# Patient Record
Sex: Male | Born: 1945 | Race: Black or African American | Hispanic: No | Marital: Single | Smoking: Former smoker
Health system: Southern US, Community
[De-identification: ages and names within clinical notes are randomized; demographics above are authoritative.]

## PROBLEM LIST (undated history)

## (undated) ENCOUNTER — Telehealth

## (undated) ENCOUNTER — Encounter

## (undated) ENCOUNTER — Ambulatory Visit: Payer: MEDICARE | Attending: Dermatology | Primary: Dermatology

## (undated) ENCOUNTER — Ambulatory Visit: Payer: MEDICARE

## (undated) ENCOUNTER — Ambulatory Visit

## (undated) ENCOUNTER — Ambulatory Visit: Attending: Internal Medicine | Primary: Internal Medicine

## (undated) ENCOUNTER — Encounter: Attending: Pharmacist | Primary: Pharmacist

## (undated) ENCOUNTER — Encounter: Attending: Dermatology | Primary: Dermatology

## (undated) DIAGNOSIS — C8400 Mycosis fungoides, unspecified site: Secondary | ICD-10-CM

## (undated) DIAGNOSIS — I10 Essential (primary) hypertension: Secondary | ICD-10-CM

## (undated) DIAGNOSIS — E7800 Pure hypercholesterolemia, unspecified: Secondary | ICD-10-CM

## (undated) DIAGNOSIS — E119 Type 2 diabetes mellitus without complications: Secondary | ICD-10-CM

## (undated) DIAGNOSIS — E039 Hypothyroidism, unspecified: Secondary | ICD-10-CM

## (undated) MED ORDER — GABAPENTIN 300 MG CAPSULE: Freq: Three times a day (TID) | ORAL | 0 days

---

## 2014-06-30 DEATH — deceased

## 2016-07-13 ENCOUNTER — Emergency Department (HOSPITAL_COMMUNITY): Payer: BC Managed Care – PPO

## 2016-07-13 ENCOUNTER — Encounter (HOSPITAL_COMMUNITY): Payer: Self-pay | Admitting: Emergency Medicine

## 2016-07-13 ENCOUNTER — Emergency Department (HOSPITAL_COMMUNITY)
Admission: EM | Admit: 2016-07-13 | Discharge: 2016-07-13 | Disposition: A | Discharge status: Home / Self Care | Payer: BC Managed Care – PPO | Attending: Emergency Medicine | Admitting: Emergency Medicine

## 2016-07-13 DIAGNOSIS — N210 Calculus in bladder: Secondary | ICD-10-CM | POA: Diagnosis not present

## 2016-07-13 DIAGNOSIS — R319 Hematuria, unspecified: Secondary | ICD-10-CM

## 2016-07-13 DIAGNOSIS — E119 Type 2 diabetes mellitus without complications: Secondary | ICD-10-CM | POA: Diagnosis not present

## 2016-07-13 DIAGNOSIS — Z79899 Other long term (current) drug therapy: Secondary | ICD-10-CM | POA: Insufficient documentation

## 2016-07-13 DIAGNOSIS — R310 Gross hematuria: Secondary | ICD-10-CM

## 2016-07-13 DIAGNOSIS — I10 Essential (primary) hypertension: Secondary | ICD-10-CM | POA: Diagnosis not present

## 2016-07-13 DIAGNOSIS — Z7982 Long term (current) use of aspirin: Secondary | ICD-10-CM | POA: Diagnosis not present

## 2016-07-13 DIAGNOSIS — F17200 Nicotine dependence, unspecified, uncomplicated: Secondary | ICD-10-CM | POA: Diagnosis not present

## 2016-07-13 HISTORY — DX: Essential (primary) hypertension: I10

## 2016-07-13 HISTORY — DX: Pure hypercholesterolemia, unspecified: E78.00

## 2016-07-13 HISTORY — DX: Type 2 diabetes mellitus without complications: E11.9

## 2016-07-13 LAB — URINALYSIS, ROUTINE W REFLEX MICROSCOPIC
BILIRUBIN URINE: NEGATIVE
GLUCOSE, UA: NEGATIVE mg/dL
Ketones, ur: NEGATIVE mg/dL
Nitrite: NEGATIVE
PH: 5 (ref 5.0–8.0)
Protein, ur: 100 mg/dL — AB
SPECIFIC GRAVITY, URINE: 1.018 (ref 1.005–1.030)
Squamous Epithelial / LPF: NONE SEEN

## 2016-07-13 LAB — COMPREHENSIVE METABOLIC PANEL
ALK PHOS: 48 U/L (ref 38–126)
ALT: 53 U/L (ref 17–63)
ANION GAP: 10 (ref 5–15)
AST: 34 U/L (ref 15–41)
Albumin: 4.2 g/dL (ref 3.5–5.0)
BILIRUBIN TOTAL: 0.5 mg/dL (ref 0.3–1.2)
BUN: 19 mg/dL (ref 6–20)
CALCIUM: 9.4 mg/dL (ref 8.9–10.3)
CO2: 23 mmol/L (ref 22–32)
Chloride: 104 mmol/L (ref 101–111)
Creatinine, Ser: 0.98 mg/dL (ref 0.61–1.24)
GLUCOSE: 121 mg/dL — AB (ref 65–99)
Potassium: 3.9 mmol/L (ref 3.5–5.1)
Sodium: 137 mmol/L (ref 135–145)
TOTAL PROTEIN: 7.7 g/dL (ref 6.5–8.1)

## 2016-07-13 LAB — CBC WITH DIFFERENTIAL/PLATELET
BASOS ABS: 0.1 10*3/uL (ref 0.0–0.1)
BASOS PCT: 1 %
Eosinophils Absolute: 1.3 10*3/uL — ABNORMAL HIGH (ref 0.0–0.7)
Eosinophils Relative: 15 %
HEMATOCRIT: 40.1 % (ref 39.0–52.0)
Hemoglobin: 13.2 g/dL (ref 13.0–17.0)
Lymphocytes Relative: 17 %
Lymphs Abs: 1.5 10*3/uL (ref 0.7–4.0)
MCH: 29.1 pg (ref 26.0–34.0)
MCHC: 32.9 g/dL (ref 30.0–36.0)
MCV: 88.3 fL (ref 78.0–100.0)
MONO ABS: 0.3 10*3/uL (ref 0.1–1.0)
Monocytes Relative: 3 %
NEUTROS ABS: 6 10*3/uL (ref 1.7–7.7)
Neutrophils Relative %: 64 %
PLATELETS: 201 10*3/uL (ref 150–400)
RBC: 4.54 MIL/uL (ref 4.22–5.81)
RDW: 14 % (ref 11.5–15.5)
WBC: 9.2 10*3/uL (ref 4.0–10.5)

## 2016-07-13 MED ORDER — IOPAMIDOL (ISOVUE-300) INJECTION 61%
100.0000 mL | Freq: Once | INTRAVENOUS | Status: AC | PRN
  Administered 2016-07-13: 100 mL via INTRAVENOUS

## 2016-07-13 NOTE — Discharge Instructions (Signed)
As discussed, it is important that you follow up with our urology physicians for continued management of your condition.

## 2016-07-13 NOTE — ED Provider Notes (Signed)
[facility] DEPT Provider Note   CSN: [id number] Arrival date & time: 07/13/16  1901     History   Chief Complaint Chief Complaint  Patient presents with  . Hematuria    HPI Cory Lynch is a 71 y.o. male.  HPI  Patient presents with concern of hematuria. Symptoms of been present for at least one week, but over the past day has the hematuria has been persistent patient now has some concern. No dysuria, no abdominal pain, no inability to urinate. He denies history of any bladder disease, prostate disease per He states that he is otherwise well, and was doing fine until this began. Since onset no clear alleviating or exacerbating factors.   Past Medical History:  Diagnosis Date  . Diabetes mellitus without complication ([place])   . Hypercholesteremia   . Hypertension     There are no active problems to display for this patient.   History reviewed. No pertinent surgical history.     Home Medications    Prior to Admission medications   Not on File    Family History No family history on file.  Social History Social History  Substance Use Topics  . Smoking status: Current Every Day Smoker  . Smokeless tobacco: Never Used  . Alcohol use No     Allergies   Patient has no allergy information on record.   Review of Systems Review of Systems  Constitutional:       Per HPI, otherwise negative  HENT:       Per HPI, otherwise negative  Respiratory:       Per HPI, otherwise negative  Cardiovascular:       Per HPI, otherwise negative  Gastrointestinal: Negative for vomiting.  Endocrine:       Negative aside from HPI  Genitourinary:       Neg aside from HPI   Musculoskeletal:       Per HPI, otherwise negative  Skin: Negative.   Neurological: Negative for syncope.     Physical Exam Updated Vital Signs BP 135/70   Pulse 93   Temp 98.1 F (36.7 C)   Resp 18   Ht 5\' 6"  (1.676 m)   Wt 90.7 kg (200 lb)   SpO2 99%   BMI 32.28 kg/m    Physical Exam  Constitutional: He is oriented to person, place, and time. He appears well-developed. No distress.  HENT:  Head: Normocephalic and atraumatic.  Eyes: Conjunctivae and EOM are normal.  Cardiovascular: Normal rate and regular rhythm.   Pulmonary/Chest: Effort normal. No stridor. No respiratory distress.  Abdominal: He exhibits no distension. There is no tenderness. There is no guarding.  Musculoskeletal: He exhibits no edema.  Neurological: He is alert and oriented to person, place, and time.  Skin: Skin is warm and dry.  Psychiatric: He has a normal mood and affect.  Nursing note and vitals reviewed.    ED Treatments / Results  Labs (all labs ordered are listed, but only abnormal results are displayed) Labs Reviewed  URINALYSIS, ROUTINE W REFLEX MICROSCOPIC - Abnormal; Notable for the following:       Result Value   APPearance CLOUDY (*)    Hgb urine dipstick LARGE (*)    Protein, ur 100 (*)    Leukocytes, UA TRACE (*)    Bacteria, UA RARE (*)    All other components within normal limits  COMPREHENSIVE METABOLIC PANEL - Abnormal; Notable for the following:    Glucose, Bld 121 (*)  All other components within normal limits  CBC WITH DIFFERENTIAL/PLATELET - Abnormal; Notable for the following:    Eosinophils Absolute 1.3 (*)    All other components within normal limits   Radiology study (CT) reviewed, and discussed with the patient.   Procedures Procedures (including critical care time)  Medications Ordered in ED Medications  iopamidol (ISOVUE-300) 61 % injection 100 mL (100 mLs Intravenous Contrast Given 07/13/16 2102)   Update: Patient accompanied by his sister. He is awake and alert, in no distress. He remains essentially symptomatic aside from that hematuria. We discussed the CT findings, concern for bladder stone, pelvic adenopathy, prostate enlargement, importance of following up with urology in the coming days. Patient remains hemodynamically  stable, awake, alert, in no distress, and  was discharged in stable condition with close outpatient follow-up.   Initial Impression / Assessment and Plan / ED Course  I have reviewed the triage vital signs and the nursing notes.  Pertinent labs & imaging results that were available during my care of the patient were reviewed by me and considered in my medical decision making (see chart for details).    Final Clinical Impressions(s) / ED Diagnoses   Final diagnoses:  Hematuria  Bladder stone Pelvic adenopathy   Carmin Muskrat, MD 07/16/16 [phone]

## 2016-07-13 NOTE — ED Triage Notes (Signed)
Pt c/o blood in urine for a while but denies any pain.

## 2016-07-27 ENCOUNTER — Ambulatory Visit (INDEPENDENT_AMBULATORY_CARE_PROVIDER_SITE_OTHER): Payer: BC Managed Care – PPO | Admitting: Urology

## 2016-07-27 ENCOUNTER — Other Ambulatory Visit: Payer: Self-pay | Admitting: Urology

## 2016-07-27 ENCOUNTER — Ambulatory Visit: Payer: BC Managed Care – PPO | Admitting: Urology

## 2016-07-27 DIAGNOSIS — R310 Gross hematuria: Secondary | ICD-10-CM

## 2016-07-27 DIAGNOSIS — R599 Enlarged lymph nodes, unspecified: Secondary | ICD-10-CM

## 2016-08-02 ENCOUNTER — Other Ambulatory Visit: Payer: Self-pay | Admitting: Student

## 2016-08-03 ENCOUNTER — Other Ambulatory Visit: Payer: Self-pay | Admitting: Student

## 2016-08-06 ENCOUNTER — Encounter (HOSPITAL_COMMUNITY): Payer: Self-pay

## 2016-08-06 ENCOUNTER — Ambulatory Visit (HOSPITAL_COMMUNITY): Payer: BC Managed Care – PPO

## 2016-08-06 ENCOUNTER — Ambulatory Visit (HOSPITAL_COMMUNITY): Admission: RE | Admit: 2016-08-06 | Payer: BC Managed Care – PPO | Source: Ambulatory Visit

## 2016-08-13 ENCOUNTER — Other Ambulatory Visit: Payer: Self-pay | Admitting: Radiology

## 2016-08-14 ENCOUNTER — Other Ambulatory Visit: Payer: Self-pay | Admitting: Student

## 2016-08-15 ENCOUNTER — Ambulatory Visit (HOSPITAL_COMMUNITY)
Admission: RE | Admit: 2016-08-15 | Discharge: 2016-08-15 | Disposition: A | Discharge status: Home / Self Care | Payer: BC Managed Care – PPO | Source: Ambulatory Visit | Attending: Urology | Admitting: Urology

## 2016-08-15 ENCOUNTER — Encounter (HOSPITAL_COMMUNITY): Payer: Self-pay

## 2016-08-15 DIAGNOSIS — E119 Type 2 diabetes mellitus without complications: Secondary | ICD-10-CM | POA: Insufficient documentation

## 2016-08-15 DIAGNOSIS — I10 Essential (primary) hypertension: Secondary | ICD-10-CM | POA: Insufficient documentation

## 2016-08-15 DIAGNOSIS — Z7982 Long term (current) use of aspirin: Secondary | ICD-10-CM | POA: Diagnosis not present

## 2016-08-15 DIAGNOSIS — E785 Hyperlipidemia, unspecified: Secondary | ICD-10-CM | POA: Insufficient documentation

## 2016-08-15 DIAGNOSIS — R319 Hematuria, unspecified: Secondary | ICD-10-CM | POA: Insufficient documentation

## 2016-08-15 DIAGNOSIS — F17200 Nicotine dependence, unspecified, uncomplicated: Secondary | ICD-10-CM | POA: Diagnosis not present

## 2016-08-15 DIAGNOSIS — C8595 Non-Hodgkin lymphoma, unspecified, lymph nodes of inguinal region and lower limb: Secondary | ICD-10-CM | POA: Diagnosis not present

## 2016-08-15 DIAGNOSIS — Z79899 Other long term (current) drug therapy: Secondary | ICD-10-CM | POA: Diagnosis not present

## 2016-08-15 DIAGNOSIS — R599 Enlarged lymph nodes, unspecified: Secondary | ICD-10-CM

## 2016-08-15 DIAGNOSIS — R590 Localized enlarged lymph nodes: Secondary | ICD-10-CM | POA: Diagnosis present

## 2016-08-15 LAB — CBC
HEMATOCRIT: 39.3 % (ref 39.0–52.0)
Hemoglobin: 13.3 g/dL (ref 13.0–17.0)
MCH: 29 pg (ref 26.0–34.0)
MCHC: 33.8 g/dL (ref 30.0–36.0)
MCV: 85.8 fL (ref 78.0–100.0)
Platelets: 226 10*3/uL (ref 150–400)
RBC: 4.58 MIL/uL (ref 4.22–5.81)
RDW: 13.8 % (ref 11.5–15.5)
WBC: 6.8 10*3/uL (ref 4.0–10.5)

## 2016-08-15 LAB — PROTIME-INR
INR: 1.01
Prothrombin Time: 13.3 seconds (ref 11.4–15.2)

## 2016-08-15 LAB — APTT: aPTT: 30 seconds (ref 24–36)

## 2016-08-15 LAB — GLUCOSE, CAPILLARY: Glucose-Capillary: 121 mg/dL — ABNORMAL HIGH (ref 65–99)

## 2016-08-15 MED ORDER — MIDAZOLAM HCL 2 MG/2ML IJ SOLN
INTRAMUSCULAR | Status: AC | PRN
  Administered 2016-08-15 (×2): 1 mg via INTRAVENOUS

## 2016-08-15 MED ORDER — FENTANYL CITRATE (PF) 100 MCG/2ML IJ SOLN
INTRAMUSCULAR | Status: AC
  Filled 2016-08-15: qty 4

## 2016-08-15 MED ORDER — SODIUM CHLORIDE 0.9 % IV SOLN
INTRAVENOUS | Status: DC
  Administered 2016-08-15: 08:00:00 via INTRAVENOUS

## 2016-08-15 MED ORDER — MIDAZOLAM HCL 2 MG/2ML IJ SOLN
INTRAMUSCULAR | Status: AC
  Filled 2016-08-15: qty 4

## 2016-08-15 MED ORDER — LIDOCAINE HCL 1 % IJ SOLN
INTRAMUSCULAR | Status: DC | PRN
  Administered 2016-08-15: 10 mL

## 2016-08-15 MED ORDER — FENTANYL CITRATE (PF) 100 MCG/2ML IJ SOLN
INTRAMUSCULAR | Status: AC | PRN
  Administered 2016-08-15 (×2): 50 ug via INTRAVENOUS

## 2016-08-15 NOTE — Procedures (Signed)
Interventional Radiology Procedure Note  Procedure: CT guided biopsy of deep right inguinal LN.   Complications: None  Estimated Blood Loss:  <25 mL  Recommendations: - Path pending - Bedrest x 1 hr - DC home  Signed,  Criselda Peaches, MD

## 2016-08-15 NOTE — Discharge Instructions (Signed)
Moderate Conscious Sedation, Adult, Care After  These instructions provide you with information about caring for yourself after your procedure. Your health care provider may also give you more specific instructions. Your treatment has been planned according to current medical practices, but problems sometimes occur. Call your health care provider if you have any problems or questions after your procedure.  What can I expect after the procedure?  After your procedure, it is common:   To feel sleepy for several hours.   To feel clumsy and have poor balance for several hours.   To have poor judgment for several hours.   To vomit if you eat too soon.    Follow these instructions at home:  For at least 24 hours after the procedure:     Do not:  ? Participate in activities where you could fall or become injured.  ? Drive.  ? Use heavy machinery.  ? Drink alcohol.  ? Take sleeping pills or medicines that cause drowsiness.  ? Make important decisions or sign legal documents.  ? Take care of children on your own.   Rest.  Eating and drinking   Follow the diet recommended by your health care provider.   If you vomit:  ? Drink water, juice, or soup when you can drink without vomiting.  ? Make sure you have little or no nausea before eating solid foods.  General instructions   Have a responsible adult stay with you until you are awake and alert.   Take over-the-counter and prescription medicines only as told by your health care provider.   If you smoke, do not smoke without supervision.   Keep all follow-up visits as told by your health care provider. This is important.  Contact a health care provider if:   You keep feeling nauseous or you keep vomiting.   You feel light-headed.   You develop a rash.   You have a fever.  Get help right away if:   You have trouble breathing.  This information is not intended to replace advice given to you by your health care provider. Make sure you discuss any questions you have  with your health care provider.  Document Released: 11/05/2012 Document Revised: 06/20/2015 Document Reviewed: 05/07/2015  Elsevier Interactive Patient Education  2018 [employer]

## 2016-08-15 NOTE — H&P (Signed)
Chief Complaint: Patient was seen in consultation today for lymphadenopathy  Referring Physician(s): Wrenn,John  Supervising Physician: Jacqulynn Cadet  Patient Status: [facility] - Out-pt  History of Present Illness: Cory Lynch is a 71 y.o. male with past medical hsitory of DM, HLD, and HTN presents with recent complaint of hematuria for which he has been evaluated by Urology.   CT Abd/Pelvis 07/13/16: 1. 2.3 cm loose urinary bladder stone. 2. Pathologically enlarged pelvic adenopathy suspicious for malignancy. Possibilities might include lymphoma or prostate cancer. Tissue diagnosis likely warranted. 3. Considerable enlargement of the prostate gland, volume estimated at 100 cc, with a focally prominent median lobe indenting the bladder base, and with some associated fullness of the seminal vesicles. In light of the pelvic adenopathy, workup by PSA level and assessment the prostate is suggested. 4. 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  IR consulted for lymph node biopsy at the request of Dr. Jeffie Pollock. Case has been reviewed by Dr. Anselm Pancoast who approves patient for procedure.   Patient presents today in his usual state of health.  He has been NPO.  He does not take blood thinners.    Past Medical History:  Diagnosis Date  . Diabetes mellitus without complication ([place])   . Hypercholesteremia   . Hypertension     History reviewed. No pertinent surgical history.  Allergies: Patient has no known allergies.  Medications: Prior to Admission medications   Medication Sig Start Date End Date Taking? Authorizing Provider  aspirin EC 81 MG tablet Take 81 mg by mouth daily.   Yes [provider]  atorvastatin (LIPITOR) 40 MG tablet Take 40 mg by mouth at bedtime. 04/12/16  Yes [provider]  hydrocortisone 2.5 % ointment Apply 1 application topically  daily as needed (for itching/irritation).  06/22/16  Yes [provider]  lisinopril (PRINIVIL,ZESTRIL) 5 MG tablet Take 5 mg by mouth daily. 04/12/16  Yes [provider]  hydrOXYzine (ATARAX/VISTARIL) 25 MG tablet Take 25 mg by mouth daily as needed for anxiety or itching.  05/24/16   [provider]  ibuprofen (ADVIL,MOTRIN) 800 MG tablet Take 800 mg by mouth 3 (three) times daily as needed for mild pain or moderate pain.  04/16/16   [provider]     History reviewed. No pertinent family history.  Social History   Social History  . Marital status: Single    Spouse name: N/A  . Number of children: N/A  . Years of education: N/A   Social History Main Topics  . Smoking status: Current Every Day Smoker  . Smokeless tobacco: Never Used  . Alcohol use No  . Drug use: No  . Sexual activity: Not Asked   Other Topics Concern  . None   Social History Narrative  . None    Review of Systems  Constitutional: Negative for fatigue and fever.  Respiratory: Negative for cough and shortness of breath.   Cardiovascular: Negative for chest pain.  Gastrointestinal: Negative for abdominal pain.  Psychiatric/Behavioral: Negative for behavioral problems and confusion.    Vital Signs: BP 131/78   Pulse 77   Temp (!) 97.2 F (36.2 C) (Oral)   Resp 18   Ht 5' 7.5" (1.715 m)   Wt 203 lb 8 oz (92.3 kg)   SpO2 98%   BMI 31.40 kg/m   Physical Exam  Constitutional: He is oriented to person, place, and time. He  appears well-developed.  Cardiovascular: Normal rate, regular rhythm and normal heart sounds.   Pulmonary/Chest: Effort normal and breath sounds normal. No respiratory distress.  Neurological: He is alert and oriented to person, place, and time.  Skin: Skin is warm and dry.  Psychiatric: He has a normal mood and affect. His behavior is normal. Judgment and thought content normal.  Nursing note and vitals reviewed.   Mallampati Score:  MD  Evaluation Airway: WNL Heart: WNL Abdomen: WNL Chest/ Lungs: WNL ASA  Classification: 3 Mallampati/Airway Score: Two  Imaging: No results found.  Labs:  CBC:  Recent Labs  07/13/16 1949 08/15/16 0724  WBC 9.2 6.8  HGB 13.2 13.3  HCT 40.1 39.3  PLT 201 226    COAGS:  Recent Labs  08/15/16 0724  INR 1.01  APTT 30    BMP:  Recent Labs  07/13/16 1949  NA 137  K 3.9  CL 104  CO2 23  GLUCOSE 121*  BUN 19  CALCIUM 9.4  CREATININE 0.98  GFRNONAA >60  GFRAA >60    LIVER FUNCTION TESTS:  Recent Labs  07/13/16 1949  BILITOT 0.5  AST 34  ALT 53  ALKPHOS 48  PROT 7.7  ALBUMIN 4.2    TUMOR MARKERS: No results for input(s): AFPTM, CEA, CA199, CHROMGRNA in the last 8760 hours.  Assessment and Plan: Patient with recent episodes of hematuria is found to have pelvic lymphadenopathy.  IR consulted for lymph node biopsy at the request of Dr. Jeffie Pollock.  Patient presents for procedure today.  He denies new complaints.  He has been NPO.  He does not take blood thinners. Risks and Benefits discussed with the patient including, but not limited to bleeding, infection, damage to adjacent structures or low yield requiring additional tests. All of the patient's questions were answered, patient is agreeable to proceed. Consent signed and in chart.  Thank you for this interesting consult.  I greatly enjoyed meeting Cory Lynch and look forward to participating in their care.  A copy of this report was sent to the requesting provider on this date.  Electronically Signed: Docia Barrier, PA 08/15/2016, 8:48 AM   I spent a total of  30 Minutes   in face to face in clinical consultation, greater than 50% of which was counseling/coordinating care for lymphadenopathy.

## 2016-08-29 ENCOUNTER — Ambulatory Visit (INDEPENDENT_AMBULATORY_CARE_PROVIDER_SITE_OTHER): Payer: BC Managed Care – PPO | Admitting: Urology

## 2016-08-29 DIAGNOSIS — R310 Gross hematuria: Secondary | ICD-10-CM

## 2016-08-30 ENCOUNTER — Ambulatory Visit (HOSPITAL_COMMUNITY): Payer: Medicare Other | Admitting: Oncology

## 2016-09-04 ENCOUNTER — Other Ambulatory Visit: Payer: Self-pay | Admitting: Urology

## 2016-09-10 NOTE — Patient Instructions (Signed)
Cory Lynch  09/10/2016     @PREFPERIOPPHARMACY @   Your procedure is scheduled on  09/17/2016 .  Report to [facility] at  [email or web address].  Call this number if you have problems the morning of surgery:  [phone]   Remember:  Do not eat food or drink liquids after midnight.  Take these medicines the morning of surgery with A SIP OF WATER  Lisinopril.   Do not wear jewelry, make-up or nail polish.  Do not wear lotions, powders, or perfumes, or deoderant.  Do not shave 48 hours prior to surgery.  Men may shave face and neck.  Do not bring valuables to the hospital.  [facility] is not responsible for any belongings or valuables.  Contacts, dentures or bridgework may not be worn into surgery.  Leave your suitcase in the car.  After surgery it may be brought to your room.  For patients admitted to the hospital, discharge time will be determined by your treatment team.  Patients discharged the day of surgery will not be allowed to drive home.   Name and phone number of your driver:   family Special instructions:  None  Please read over the following fact sheets that you were given. Anesthesia Post-op Instructions and Care and Recovery After Surgery       Cystoscopy Cystoscopy is a procedure that is used to help diagnose and sometimes treat conditions that affect that lower urinary tract. The lower urinary tract includes the bladder and the tube that drains urine from the bladder out of the body (urethra). Cystoscopy is performed with a thin, tube-shaped instrument with a light and camera at the end (cystoscope). The cystoscope may be hard (rigid) or flexible, depending on the goal of the procedure.The cystoscope is inserted through the urethra, into the bladder. Cystoscopy may be recommended if you have:  Urinary tractinfections that keep coming back (recurring).  Blood in the urine (hematuria).  Loss of bladder control (urinary incontinence) or an overactive  bladder.  Unusual cells found in a urine sample.  A blockage in the urethra.  Painful urination.  An abnormality in the bladder found during an intravenous pyelogram (IVP) or CT scan.  Cystoscopy may also be done to remove a sample of tissue to be examined under a microscope (biopsy). Tell a health care provider about:  Any allergies you have.  All medicines you are taking, including vitamins, herbs, eye drops, creams, and over-the-counter medicines.  Any problems you or family members have had with anesthetic medicines.  Any blood disorders you have.  Any surgeries you have had.  Any medical conditions you have.  Whether you are pregnant or may be pregnant. What are the risks? Generally, this is a safe procedure. However, problems may occur, including:  Infection.  Bleeding.  Allergic reactions to medicines.  Damage to other structures or organs.  What happens before the procedure?  Ask your health care provider about: ? Changing or stopping your regular medicines. This is especially important if you are taking diabetes medicines or blood thinners. ? Taking medicines such as aspirin and ibuprofen. These medicines can thin your blood. Do not take these medicines before your procedure if your health care provider instructs you not to.  Follow instructions from your health care provider about eating or drinking restrictions.  You may be given antibiotic medicine to help prevent infection.  You may have an exam or testing, such as X-rays  of the bladder, urethra, or kidneys.  You may have urine tests to check for signs of infection.  Plan to have someone take you home after the procedure. What happens during the procedure?  To reduce your risk of infection,your health care team will wash or sanitize their hands.  You will be given one or more of the following: ? A medicine to help you relax (sedative). ? A medicine to numb the area (local anesthetic).  The  area around the opening of your urethra will be cleaned.  The cystoscope will be passed through your urethra into your bladder.  Germ-free (sterile)fluid will flow through the cystoscope to fill your bladder. The fluid will stretch your bladder so that your surgeon can clearly examine your bladder walls.  The cystoscope will be removed and your bladder will be emptied. The procedure may vary among health care providers and hospitals. What happens after the procedure?  You may have some soreness or pain in your abdomen and urethra. Medicines will be available to help you.  You may have some blood in your urine.  Do not drive for 24 hours if you received a sedative. This information is not intended to replace advice given to you by your health care provider. Make sure you discuss any questions you have with your health care provider. Document Released: 01/13/2000 Document Revised: 05/26/2015 Document Reviewed: 12/02/2014 Elsevier Interactive Patient Education  2017 [place].  Cystoscopy, Care After Refer to this sheet in the next few weeks. These instructions provide you with information about caring for yourself after your procedure. Your health care provider may also give you more specific instructions. Your treatment has been planned according to current medical practices, but problems sometimes occur. Call your health care provider if you have any problems or questions after your procedure. What can I expect after the procedure? After the procedure, it is common to have:  Mild pain when you urinate. Pain should stop within a few minutes after you urinate. This may last for up to 1 week.  A small amount of blood in your urine for several days.  Feeling like you need to urinate but producing only a small amount of urine.  Follow these instructions at home:  Medicines  Take over-the-counter and prescription medicines only as told by your health care provider.  If you were  prescribed an antibiotic medicine, take it as told by your health care provider. Do not stop taking the antibiotic even if you start to feel better. General instructions   Return to your normal activities as told by your health care provider. Ask your health care provider what activities are safe for you.  Do not drive for 24 hours if you received a sedative.  Watch for any blood in your urine. If the amount of blood in your urine increases, call your health care provider.  Follow instructions from your health care provider about eating or drinking restrictions.  If a tissue sample was removed for testing (biopsy) during your procedure, it is your responsibility to get your test results. Ask your health care provider or the department performing the test when your results will be ready.  Drink enough fluid to keep your urine clear or pale yellow.  Keep all follow-up visits as told by your health care provider. This is important. Contact a health care provider if:  You have pain that gets worse or does not get better with medicine, especially pain when you urinate.  You have difficulty urinating.  Get help right away if:  You have more blood in your urine.  You have blood clots in your urine.  You have abdominal pain.  You have a fever or chills.  You are unable to urinate. This information is not intended to replace advice given to you by your health care provider. Make sure you discuss any questions you have with your health care provider. Document Released: 08/04/2004 Document Revised: 06/23/2015 Document Reviewed: 12/02/2014 Elsevier Interactive Patient Education  2017 [place] Anesthesia, Adult General anesthesia is the use of medicines to make a person "go to sleep" (be unconscious) for a medical procedure. General anesthesia is often recommended when a procedure:  Is long.  Requires you to be still or in an unusual position.  Is major and can cause you  to lose blood.  Is impossible to do without general anesthesia.  The medicines used for general anesthesia are called general anesthetics. In addition to making you sleep, the medicines:  Prevent pain.  Control your blood pressure.  Relax your muscles.  Tell a health care provider about:  Any allergies you have.  All medicines you are taking, including vitamins, herbs, eye drops, creams, and over-the-counter medicines.  Any problems you or family members have had with anesthetic medicines.  Types of anesthetics you have had in the past.  Any bleeding disorders you have.  Any surgeries you have had.  Any medical conditions you have.  Any history of heart or lung conditions, such as heart failure, sleep apnea, or chronic obstructive pulmonary disease (COPD).  Whether you are pregnant or may be pregnant.  Whether you use tobacco, alcohol, marijuana, or street drugs.  Any history of Armed forces logistics/support/administrative officer.  Any history of depression or anxiety. What are the risks? Generally, this is a safe procedure. However, problems may occur, including:  Allergic reaction to anesthetics.  Lung and heart problems.  Inhaling food or liquids from your stomach into your lungs (aspiration).  Injury to nerves.  Waking up during your procedure and being unable to move (rare).  Extreme agitation or a state of mental confusion (delirium) when you wake up from the anesthetic.  Air in the bloodstream, which can lead to stroke.  These problems are more likely to develop if you are having a major surgery or if you have an advanced medical condition. You can prevent some of these complications by answering all of your health care provider's questions thoroughly and by following all pre-procedure instructions. General anesthesia can cause side effects, including:  Nausea or vomiting  A sore throat from the breathing tube.  Feeling cold or shivery.  Feeling tired, washed out, or  achy.  Sleepiness or drowsiness.  Confusion or agitation.  What happens before the procedure? Staying hydrated Follow instructions from your health care provider about hydration, which may include:  Up to 2 hours before the procedure - you may continue to drink clear liquids, such as water, clear fruit juice, black coffee, and plain tea.  Eating and drinking restrictions Follow instructions from your health care provider about eating and drinking, which may include:  8 hours before the procedure - stop eating heavy meals or foods such as meat, fried foods, or fatty foods.  6 hours before the procedure - stop eating light meals or foods, such as toast or cereal.  6 hours before the procedure - stop drinking milk or drinks that contain milk.  2 hours before the procedure - stop drinking clear liquids.  Medicines  Ask your health care provider about: ? Changing or stopping your regular medicines. This is especially important if you are taking diabetes medicines or blood thinners. ? Taking medicines such as aspirin and ibuprofen. These medicines can thin your blood. Do not take these medicines before your procedure if your health care provider instructs you not to. ? Taking new dietary supplements or medicines. Do not take these during the week before your procedure unless your health care provider approves them.  If you are told to take a medicine or to continue taking a medicine on the day of the procedure, take the medicine with sips of water. General instructions   Ask if you will be going home the same day, the following day, or after a longer hospital stay. ? Plan to have someone take you home. ? Plan to have someone stay with you for the first 24 hours after you leave the hospital or clinic.  For 3-6 weeks before the procedure, try not to use any tobacco products, such as cigarettes, chewing tobacco, and e-cigarettes.  You may brush your teeth on the morning of the  procedure, but make sure to spit out the toothpaste. What happens during the procedure?  You will be given anesthetics through a mask and through an IV tube in one of your veins.  You may receive medicine to help you relax (sedative).  As soon as you are asleep, a breathing tube may be used to help you breathe.  An anesthesia specialist will stay with you throughout the procedure. He or she will help keep you comfortable and safe by continuing to give you medicines and adjusting the amount of medicine that you get. He or she will also watch your blood pressure, pulse, and oxygen levels to make sure that the anesthetics do not cause any problems.  If a breathing tube was used to help you breathe, it will be removed before you wake up. The procedure may vary among health care providers and hospitals. What happens after the procedure?  You will wake up, often slowly, after the procedure is complete, usually in a recovery area.  Your blood pressure, heart rate, breathing rate, and blood oxygen level will be monitored until the medicines you were given have worn off.  You may be given medicine to help you calm down if you feel anxious or agitated.  If you will be going home the same day, your health care provider may check to make sure you can stand, drink, and urinate.  Your health care providers will treat your pain and side effects before you go home.  Do not drive for 24 hours if you received a sedative.  You may: ? Feel nauseous and vomit. ? Have a sore throat. ? Have mental slowness. ? Feel cold or shivery. ? Feel sleepy. ? Feel tired. ? Feel sore or achy, even in parts of your body where you did not have surgery. This information is not intended to replace advice given to you by your health care provider. Make sure you discuss any questions you have with your health care provider. Document Released: 04/24/2007 Document Revised: 06/28/2015 Document Reviewed: 12/30/2014 Elsevier  Interactive Patient Education  2018 [place] Anesthesia, Adult, Care After These instructions provide you with information about caring for yourself after your procedure. Your health care provider may also give you more specific instructions. Your treatment has been planned according to current medical practices, but problems sometimes occur. Call your health care provider if you  have any problems or questions after your procedure. What can I expect after the procedure? After the procedure, it is common to have:  Vomiting.  A sore throat.  Mental slowness.  It is common to feel:  Nauseous.  Cold or shivery.  Sleepy.  Tired.  Sore or achy, even in parts of your body where you did not have surgery.  Follow these instructions at home: For at least 24 hours after the procedure:  Do not: ? Participate in activities where you could fall or become injured. ? Drive. ? Use heavy machinery. ? Drink alcohol. ? Take sleeping pills or medicines that cause drowsiness. ? Make important decisions or sign legal documents. ? Take care of children on your own.  Rest. Eating and drinking  If you vomit, drink water, juice, or soup when you can drink without vomiting.  Drink enough fluid to keep your urine clear or pale yellow.  Make sure you have little or no nausea before eating solid foods.  Follow the diet recommended by your health care provider. General instructions  Have a responsible adult stay with you until you are awake and alert.  Return to your normal activities as told by your health care provider. Ask your health care provider what activities are safe for you.  Take over-the-counter and prescription medicines only as told by your health care provider.  If you smoke, do not smoke without supervision.  Keep all follow-up visits as told by your health care provider. This is important. Contact a health care provider if:  You continue to have nausea or  vomiting at home, and medicines are not helpful.  You cannot drink fluids or start eating again.  You cannot urinate after 8-12 hours.  You develop a skin rash.  You have fever.  You have increasing redness at the site of your procedure. Get help right away if:  You have difficulty breathing.  You have chest pain.  You have unexpected bleeding.  You feel that you are having a life-threatening or urgent problem. This information is not intended to replace advice given to you by your health care provider. Make sure you discuss any questions you have with your health care provider. Document Released: 04/23/2000 Document Revised: 06/20/2015 Document Reviewed: 12/30/2014 Elsevier Interactive Patient Education  [employer].

## 2016-09-12 ENCOUNTER — Other Ambulatory Visit (HOSPITAL_COMMUNITY): Payer: Medicare Other

## 2016-09-14 ENCOUNTER — Encounter (HOSPITAL_COMMUNITY)
Admission: RE | Admit: 2016-09-14 | Discharge: 2016-09-14 | Disposition: A | Discharge status: Home / Self Care | Payer: BC Managed Care – PPO | Source: Ambulatory Visit | Attending: Urology | Admitting: Urology

## 2016-09-14 ENCOUNTER — Encounter (HOSPITAL_COMMUNITY): Payer: Self-pay

## 2016-09-14 DIAGNOSIS — Z87891 Personal history of nicotine dependence: Secondary | ICD-10-CM | POA: Diagnosis not present

## 2016-09-14 DIAGNOSIS — Z79899 Other long term (current) drug therapy: Secondary | ICD-10-CM | POA: Diagnosis not present

## 2016-09-14 DIAGNOSIS — R339 Retention of urine, unspecified: Secondary | ICD-10-CM | POA: Diagnosis not present

## 2016-09-14 DIAGNOSIS — I10 Essential (primary) hypertension: Secondary | ICD-10-CM | POA: Diagnosis not present

## 2016-09-14 DIAGNOSIS — Z7982 Long term (current) use of aspirin: Secondary | ICD-10-CM | POA: Diagnosis not present

## 2016-09-14 DIAGNOSIS — E119 Type 2 diabetes mellitus without complications: Secondary | ICD-10-CM | POA: Diagnosis not present

## 2016-09-14 DIAGNOSIS — N401 Enlarged prostate with lower urinary tract symptoms: Secondary | ICD-10-CM | POA: Diagnosis present

## 2016-09-14 DIAGNOSIS — N210 Calculus in bladder: Secondary | ICD-10-CM | POA: Diagnosis present

## 2016-09-14 DIAGNOSIS — Z87442 Personal history of urinary calculi: Secondary | ICD-10-CM | POA: Diagnosis not present

## 2016-09-14 DIAGNOSIS — R310 Gross hematuria: Secondary | ICD-10-CM | POA: Diagnosis not present

## 2016-09-14 DIAGNOSIS — E7800 Pure hypercholesterolemia, unspecified: Secondary | ICD-10-CM | POA: Diagnosis not present

## 2016-09-17 ENCOUNTER — Ambulatory Visit (HOSPITAL_COMMUNITY)
Admission: RE | Admit: 2016-09-17 | Discharge: 2016-09-17 | Disposition: A | Discharge status: Home / Self Care | Payer: BC Managed Care – PPO | Source: Ambulatory Visit | Attending: Urology | Admitting: Urology

## 2016-09-17 ENCOUNTER — Encounter (HOSPITAL_COMMUNITY)
Admission: RE | Disposition: A | Discharge status: Home / Self Care | Payer: Self-pay | Source: Ambulatory Visit | Attending: Urology

## 2016-09-17 ENCOUNTER — Ambulatory Visit (HOSPITAL_COMMUNITY): Payer: BC Managed Care – PPO | Admitting: Anesthesiology

## 2016-09-17 ENCOUNTER — Encounter (HOSPITAL_COMMUNITY): Payer: Self-pay | Admitting: *Deleted

## 2016-09-17 DIAGNOSIS — N210 Calculus in bladder: Secondary | ICD-10-CM

## 2016-09-17 DIAGNOSIS — Z87442 Personal history of urinary calculi: Secondary | ICD-10-CM | POA: Insufficient documentation

## 2016-09-17 DIAGNOSIS — R310 Gross hematuria: Secondary | ICD-10-CM | POA: Insufficient documentation

## 2016-09-17 DIAGNOSIS — E7800 Pure hypercholesterolemia, unspecified: Secondary | ICD-10-CM | POA: Insufficient documentation

## 2016-09-17 DIAGNOSIS — Z87891 Personal history of nicotine dependence: Secondary | ICD-10-CM | POA: Insufficient documentation

## 2016-09-17 DIAGNOSIS — E119 Type 2 diabetes mellitus without complications: Secondary | ICD-10-CM | POA: Insufficient documentation

## 2016-09-17 DIAGNOSIS — Z7982 Long term (current) use of aspirin: Secondary | ICD-10-CM | POA: Insufficient documentation

## 2016-09-17 DIAGNOSIS — I10 Essential (primary) hypertension: Secondary | ICD-10-CM | POA: Insufficient documentation

## 2016-09-17 DIAGNOSIS — R339 Retention of urine, unspecified: Secondary | ICD-10-CM | POA: Insufficient documentation

## 2016-09-17 DIAGNOSIS — Z79899 Other long term (current) drug therapy: Secondary | ICD-10-CM | POA: Insufficient documentation

## 2016-09-17 DIAGNOSIS — N401 Enlarged prostate with lower urinary tract symptoms: Secondary | ICD-10-CM | POA: Insufficient documentation

## 2016-09-17 HISTORY — PX: HOLMIUM LASER APPLICATION: SHX5852

## 2016-09-17 HISTORY — PX: CYSTOSCOPY WITH LITHOLAPAXY: SHX1425

## 2016-09-17 LAB — GLUCOSE, CAPILLARY
Glucose-Capillary: 129 mg/dL — ABNORMAL HIGH (ref 65–99)
Glucose-Capillary: 120 mg/dL — ABNORMAL HIGH (ref 65–99)

## 2016-09-17 SURGERY — CYSTOSCOPY, WITH BLADDER CALCULUS LITHOLAPAXY
Anesthesia: General

## 2016-09-17 MED ORDER — LIDOCAINE HCL (CARDIAC) 10 MG/ML IV SOLN
INTRAVENOUS | Status: DC | PRN
  Administered 2016-09-17: 40 mg via INTRAVENOUS

## 2016-09-17 MED ORDER — PROPOFOL 10 MG/ML IV BOLUS
INTRAVENOUS | Status: AC
  Filled 2016-09-17: qty 20

## 2016-09-17 MED ORDER — FENTANYL CITRATE (PF) 100 MCG/2ML IJ SOLN
INTRAMUSCULAR | Status: DC | PRN
  Administered 2016-09-17: 50 ug via INTRAVENOUS

## 2016-09-17 MED ORDER — LIDOCAINE HCL (PF) 1 % IJ SOLN
INTRAMUSCULAR | Status: AC
  Filled 2016-09-17: qty 5

## 2016-09-17 MED ORDER — ONDANSETRON HCL 4 MG/2ML IJ SOLN
INTRAMUSCULAR | Status: AC
  Filled 2016-09-17: qty 2

## 2016-09-17 MED ORDER — PHENYLEPHRINE 40 MCG/ML (10ML) SYRINGE FOR IV PUSH (FOR BLOOD PRESSURE SUPPORT)
PREFILLED_SYRINGE | INTRAVENOUS | Status: AC
  Filled 2016-09-17: qty 10

## 2016-09-17 MED ORDER — SODIUM CHLORIDE 0.9 % IR SOLN
Status: DC | PRN
  Administered 2016-09-17: 6000 mL via INTRAVESICAL

## 2016-09-17 MED ORDER — FENTANYL CITRATE (PF) 250 MCG/5ML IJ SOLN
INTRAMUSCULAR | Status: AC
  Filled 2016-09-17: qty 5

## 2016-09-17 MED ORDER — PHENYLEPHRINE HCL 10 MG/ML IJ SOLN
INTRAMUSCULAR | Status: DC | PRN
  Administered 2016-09-17 (×2): 40 ug via INTRAVENOUS

## 2016-09-17 MED ORDER — SODIUM CHLORIDE 0.9 % IJ SOLN
INTRAMUSCULAR | Status: AC
  Filled 2016-09-17: qty 10

## 2016-09-17 MED ORDER — CEFAZOLIN SODIUM-DEXTROSE 2-4 GM/100ML-% IV SOLN
2.0000 g | INTRAVENOUS | Status: AC
  Administered 2016-09-17: 2 g via INTRAVENOUS
  Filled 2016-09-17: qty 100

## 2016-09-17 MED ORDER — OXYCODONE-ACETAMINOPHEN 5-325 MG PO TABS: 1.0000 | ORAL_TABLET | Freq: Four times a day (QID) | ORAL | 0 refills | Status: DC | PRN

## 2016-09-17 MED ORDER — EPHEDRINE SULFATE 50 MG/ML IJ SOLN
INTRAMUSCULAR | Status: DC | PRN
  Administered 2016-09-17 (×3): 10 mg via INTRAVENOUS

## 2016-09-17 MED ORDER — MIDAZOLAM HCL 2 MG/2ML IJ SOLN
INTRAMUSCULAR | Status: AC
  Filled 2016-09-17: qty 2

## 2016-09-17 MED ORDER — TAMSULOSIN HCL 0.4 MG PO CAPS: 0.4000 mg | ORAL_CAPSULE | Freq: Every day | ORAL | 11 refills | Status: DC

## 2016-09-17 MED ORDER — EPHEDRINE SULFATE 50 MG/ML IJ SOLN
INTRAMUSCULAR | Status: AC
  Filled 2016-09-17: qty 1

## 2016-09-17 MED ORDER — LACTATED RINGERS IV SOLN
INTRAVENOUS | Status: DC
  Administered 2016-09-17: 08:00:00 via INTRAVENOUS

## 2016-09-17 MED ORDER — ONDANSETRON HCL 4 MG/2ML IJ SOLN
4.0000 mg | Freq: Once | INTRAMUSCULAR | Status: AC
  Administered 2016-09-17: 4 mg via INTRAVENOUS

## 2016-09-17 MED ORDER — MIDAZOLAM HCL 2 MG/2ML IJ SOLN
1.0000 mg | Freq: Once | INTRAMUSCULAR | Status: AC | PRN
  Administered 2016-09-17: 2 mg via INTRAVENOUS

## 2016-09-17 MED ORDER — PROPOFOL 10 MG/ML IV BOLUS
INTRAVENOUS | Status: DC | PRN
  Administered 2016-09-17: 160 mg via INTRAVENOUS
  Administered 2016-09-17 (×2): 50 mg via INTRAVENOUS
  Administered 2016-09-17: 40 mg via INTRAVENOUS

## 2016-09-17 MED ORDER — STERILE WATER FOR IRRIGATION IR SOLN
Status: DC | PRN
  Administered 2016-09-17: 1000 mL

## 2016-09-17 MED ORDER — GLYCOPYRROLATE 0.2 MG/ML IJ SOLN
INTRAMUSCULAR | Status: AC
  Filled 2016-09-17: qty 1

## 2016-09-17 MED ORDER — GLYCOPYRROLATE 0.2 MG/ML IJ SOLN
0.1000 mg | Freq: Once | INTRAMUSCULAR | Status: AC
  Administered 2016-09-17: 0.1 mg via INTRAVENOUS

## 2016-09-17 SURGICAL SUPPLY — 18 items
BAG HAMPER (MISCELLANEOUS) ×3 IMPLANT
BAG URINE LEG 500ML (DRAIN) ×3 IMPLANT
CATH FOLEY 3WAY 30CC 22F (CATHETERS) ×3 IMPLANT
CLOTH BEACON ORANGE TIMEOUT ST (SAFETY) ×3 IMPLANT
DRAINAGE PROTECTOR AND ATHETER PLUG ×3 IMPLANT
GLOVE BIO SURGEON STRL SZ8 (GLOVE) ×3 IMPLANT
GLOVE BIOGEL PI IND STRL 7.0 (GLOVE) ×3 IMPLANT
GLOVE BIOGEL PI INDICATOR 7.0 (GLOVE) ×6
GOWN STRL REUS W/TWL LRG LVL3 (GOWN DISPOSABLE) ×3 IMPLANT
GOWN STRL REUS W/TWL XL LVL3 (GOWN DISPOSABLE) ×3 IMPLANT
IV NS IRRIG 3000ML ARTHROMATIC (IV SOLUTION) ×6 IMPLANT
KIT ROOM TURNOVER AP CYSTO (KITS) ×3 IMPLANT
LASER FIBER DISP 1000U (UROLOGICAL SUPPLIES) ×3 IMPLANT
PACK CYSTO (CUSTOM PROCEDURE TRAY) ×3 IMPLANT
PAD ARMBOARD 7.5X6 YLW CONV (MISCELLANEOUS) ×3 IMPLANT
PLUG CATH AND CAP STER (CATHETERS) ×3 IMPLANT
TOWEL OR 17X26 4PK STRL BLUE (TOWEL DISPOSABLE) ×3 IMPLANT
WATER STERILE IRR 1000ML POUR (IV SOLUTION) ×3 IMPLANT

## 2016-09-17 NOTE — Anesthesia Preprocedure Evaluation (Signed)
Anesthesia Evaluation  Patient identified by MRN, date of birth, ID band Patient awake    Airway Mallampati: II       Dental  (+) Poor Dentition   Pulmonary neg pulmonary ROS, former smoker,    Pulmonary exam normal        Cardiovascular METS: 3 - Mets hypertension, Pt. on medications Normal cardiovascular exam Rhythm:Regular Rate:Normal     Neuro/Psych negative neurological ROS     GI/Hepatic negative GI ROS,   Endo/Other  diabetes, Well Controlled  Renal/GU negative Renal ROS     Musculoskeletal negative musculoskeletal ROS (+)   Abdominal Normal abdominal exam  (+)   Peds  Hematology   Anesthesia Other Findings   Reproductive/Obstetrics                             Anesthesia Physical Anesthesia Plan  ASA: III  Anesthesia Plan: General   Post-op Pain Management:    Induction: Intravenous  PONV Risk Score and Plan:   Airway Management Planned: LMA  Additional Equipment:   Intra-op Plan:   Post-operative Plan: Extubation in OR  Informed Consent: I have reviewed the patients History and Physical, chart, labs and discussed the procedure including the risks, benefits and alternatives for the proposed anesthesia with the patient or authorized representative who has indicated his/her understanding and acceptance.     Plan Discussed with: CRNA  Anesthesia Plan Comments:         Anesthesia Quick Evaluation

## 2016-09-17 NOTE — Transfer of Care (Signed)
Immediate Anesthesia Transfer of Care Note  Patient: Cory Lynch  Procedure(s) Performed: Procedure(s) with comments: CYSTOSCOPY WITH LITHOLAPAXY (N/A) - 38 MINS [phone] [id number] A HOLMIUM LASER APPLICATION (N/A)  Patient Location: PACU  Anesthesia Type:General  Level of Consciousness: awake  Airway & Oxygen Therapy: Patient Spontanous Breathing and Patient connected to nasal cannula oxygen  Post-op Assessment: Report given to RN  Post vital signs: Reviewed and stable  Last Vitals:  Vitals:   09/17/16 0905 09/17/16 0910  BP: 108/68 117/73  Pulse:    Resp: (!) 32 (!) 29  Temp:    SpO2: 99% 98%    Last Pain:  Vitals:   09/17/16 0743  TempSrc: Oral      Patients Stated Pain Goal: 8 (16/60/60 0459)  Complications: No apparent anesthesia complications

## 2016-09-17 NOTE — Discharge Instructions (Signed)
Indwelling Urinary Catheter Care, Adult  Take good care of your catheter to keep it working and to prevent problems.  How to wear your catheter  Attach your catheter to your leg with tape (adhesive tape) or a leg strap. Make sure it is not too tight. If you use tape, remove any bits of tape that are already on the catheter.  How to wear a drainage bag  You should have:   A large overnight bag.   A small leg bag.    Overnight Bag  You may wear the overnight bag at any time. Always keep the bag below the level of your bladder but off the floor. When you sleep, put a clean plastic bag in a wastebasket. Then hang the bag inside the wastebasket.  Leg Bag  Never wear the leg bag at night. Always wear the leg bag below your knee. Keep the leg bag secure with a leg strap or tape.  How to care for your skin   Clean the skin around the catheter at least once every day.   Shower every day. Do not take baths.   Put creams, lotions, or ointments on your genital area only as told by your doctor.   Do not use powders, sprays, or lotions on your genital area.  How to clean your catheter and your skin  1. Wash your hands with soap and water.  2. Wet a washcloth in warm water and gentle (mild) soap.  3. Use the washcloth to clean the skin where the catheter enters your body. Clean downward and wipe away from the catheter in small circles. Do not wipe toward the catheter.  4. Pat the area dry with a clean towel. Make sure to clean off all soap.  How to care for your drainage bags  Empty your drainage bag when it is ?- full or at least 2-3 times a day. Replace your drainage bag once a month or sooner if it starts to smell bad or look dirty. Do not clean your drainage bag unless told by your doctor.  Emptying a drainage bag    Supplies Needed   Rubbing alcohol.   Gauze pad or cotton ball.   Tape or a leg strap.    Steps  1. Wash your hands with soap and water.  2. Separate (detach) the bag from your leg.  3. Hold the bag over  the toilet or a clean container. Keep the bag below your hips and bladder. This stops pee (urine) from going back into the tube.  4. Open the pour spout at the bottom of the bag.  5. Empty the pee into the toilet or container. Do not let the pour spout touch any surface.  6. Put rubbing alcohol on a gauze pad or cotton ball.  7. Use the gauze pad or cotton ball to clean the pour spout.  8. Close the pour spout.  9. Attach the bag to your leg with tape or a leg strap.  10. Wash your hands.    Changing a drainage bag  Supplies Needed   Alcohol wipes.   A clean drainage bag.   Adhesive tape or a leg strap.    Steps  1. Wash your hands with soap and water.  2. Separate the dirty bag from your leg.  3. Pinch the rubber catheter with your fingers so that pee does not spill out.  4. Separate the catheter tube from the drainage tube where these tubes connect (at the   connection valve). Do not let the tubes touch any surface.  5. Clean the end of the catheter tube with an alcohol wipe. Use a different alcohol wipe to clean the end of the drainage tube.  6. Connect the catheter tube to the drainage tube of the clean bag.  7. Attach the new bag to the leg with adhesive tape or a leg strap.  8. Wash your hands.    How to prevent infection and other problems   Never pull on your catheter or try to remove it. Pulling can damage tissue in your body.   Always wash your hands before and after touching your catheter.   If a leg strap gets wet, replace it with a dry one.   Drink enough fluids to keep your pee clear or pale yellow, or as told by your doctor.   Do not let the drainage bag or tubing touch the floor.   Wear cotton underwear.   If you are male, wipe from front to back after you poop (have a bowel movement).   Check on the catheter often to make sure it works and the tubing is not twisted.  Get help if:   Your pee is cloudy.   Your pee smells unusually bad.   Your pee is not draining into the bag.   Your  tube gets clogged.   Your catheter starts to leak.   Your bladder feels full.  Get help right away if:   You have redness, swelling, or pain where the catheter enters your body.   You have fluid, pus, or a bad smell coming from the area where the catheter enters your body.   The area where the catheter enters your body feels warm.   You have a fever.   You have pain in your:  ? Stomach (abdomen).  ? Legs.  ? Lower back.  ? Bladder.   You see blood fill the catheter.   Your pee is pink or red.   You feel sick to your stomach (nauseous).   You throw up (vomit).   You have chills.   Your catheter gets pulled out.  This information is not intended to replace advice given to you by your health care provider. Make sure you discuss any questions you have with your health care provider.  Document Released: 05/12/2012 Document Revised: 12/14/2015 Document Reviewed: 06/30/2013  Elsevier Interactive Patient Education  2018 [employer]

## 2016-09-17 NOTE — Anesthesia Postprocedure Evaluation (Signed)
Anesthesia Post Note  Patient: Cory Lynch  Procedure(s) Performed: Procedure(s) (LRB): CYSTOSCOPY WITH LITHOLAPAXY (N/A) HOLMIUM LASER APPLICATION (N/A)  Patient location during evaluation: PACU Anesthesia Type: General Level of consciousness: awake and alert and oriented Pain management: pain level controlled Vital Signs Assessment: post-procedure vital signs reviewed and stable Respiratory status: spontaneous breathing Cardiovascular status: stable Postop Assessment: no signs of nausea or vomiting Anesthetic complications: no     Last Vitals:  Vitals:   09/17/16 1100 09/17/16 1104  BP: 111/72   Pulse: 76   Resp: 18   Temp:    SpO2: 95% 96%    Last Pain:  Vitals:   09/17/16 1104  TempSrc:   PainSc: Asleep                 [name]

## 2016-09-17 NOTE — Op Note (Signed)
Preoperative diagnosis: BPH with incomplete emptying, bladder calculus  Postoperative diagnosis: same  Procedure: 1 cystoscopy 2. cystolithalopaxy for a stone greater than 2.5cm  Attending: Nicolette Bang  Anesthesia: General  Estimated blood loss: Minimal  Drains: 22 french foley  Specimens: 1. Bladder calculus  Antibiotics: ancef  Findings:  Ureteral orifices in normal anatomic location.  3.5cm bladder calculus  Indications: Patient is a 71 year old male with a history of BPH and bladder calculus.  After discussing treatment options, they decided proceed with cystolithalopaxy.  Procedure her in detail: The patient was brought to the operating room and a brief timeout was done to ensure correct patient, correct procedure, correct site.  General anesthesia was administered patient was placed in dorsal lithotomy position.  Their genitalia was then prepped and draped in usual sterile fashion.  A rigid 64 French cystoscope was passed in the urethra and the bladder.  Bladder was inspected and we noted no masses or lesions.  the ureteral orifices were in the normal orthotopic locations. Using the 1000nm laser fiber the bladder calculus was fragmented and the fragments were then irrigated from the bladder. The stone fragments were the sent for analysis. We then placed a 22 French foley catehter The bladder was then drained and this concluded the procedure which was well tolerated by patient.  Complications: None  Condition: Stable, extubated, transferred to PACU  Plan: Patient is to be discharge home. He is to followup in 5 days for a voiding trial. He will followup in 2 weeks for stone analysis discussion

## 2016-09-17 NOTE — H&P (Signed)
Urology Admission H&P  Chief Complaint: gross hematuria  History of Present Illness: Mr Cory Lynch is a 71yo with a hx of bladder calculus and gross hematuria  Past Medical History:  Diagnosis Date  . Diabetes mellitus without complication ([place])   . Hypercholesteremia   . Hypertension    History reviewed. No pertinent surgical history.  Home Medications:  Prescriptions Prior to Admission  Medication Sig Dispense Refill Last Dose  . aspirin EC 81 MG tablet Take 81 mg by mouth daily.   Past Week at Unknown time  . atorvastatin (LIPITOR) 40 MG tablet Take 40 mg by mouth at bedtime.   Past Week at Unknown time  . lisinopril (PRINIVIL,ZESTRIL) 5 MG tablet Take 5 mg by mouth daily.   09/17/2016 at 0530  . Omega-3 Fatty Acids (FISH OIL) 1000 MG CAPS Take 1 capsule by mouth daily.   Past Week at Unknown time   Allergies: No Known Allergies  History reviewed. No pertinent family history. Social History:  reports that he quit smoking about 3 years ago. His smoking use included Cigarettes. He smoked 0.25 packs per day. He has never used smokeless tobacco. He reports that he does not drink alcohol or use drugs.  Review of Systems  Genitourinary: Positive for hematuria.  All other systems reviewed and are negative.   Physical Exam:  Vital signs in last 24 hours: Temp:  [98.5 F (36.9 C)] 98.5 F (36.9 C) (08/20 0743) Pulse Rate:  [93] 93 (08/20 0743) Resp:  [21] 21 (08/20 0743) BP: (129)/(84) 129/84 (08/20 0743) SpO2:  [96 %] 96 % (08/20 0743) Weight:  [94.3 kg (208 lb)] 94.3 kg (208 lb) (08/20 0743) Physical Exam  Constitutional: He is oriented to person, place, and time. He appears well-developed and well-nourished.  HENT:  Head: Normocephalic and atraumatic.  Eyes: Pupils are equal, round, and reactive to light. EOM are normal.  Neck: Normal range of motion. No thyromegaly present.  Cardiovascular: Normal rate and regular rhythm.   Respiratory: Effort normal. No respiratory  distress.  GI: Soft. He exhibits no distension.  Musculoskeletal: Normal range of motion. He exhibits no edema.  Neurological: He is alert and oriented to person, place, and time.  Skin: Skin is warm and dry.  Psychiatric: He has a normal mood and affect. His behavior is normal. Judgment and thought content normal.    Laboratory Data:  Results for orders placed or performed during the hospital encounter of 09/17/16 (from the past 24 hour(s))  Glucose, capillary     Status: Abnormal   Collection Time: 09/17/16  7:42 AM  Result Value Ref Range   Glucose-Capillary 129 (H) 65 - 99 mg/dL   No results found for this or any previous visit (from the past 240 hour(s)). Creatinine: No results for input(s): CREATININE in the last 168 hours. Baseline Creatinine: unknwon  Impression/Assessment:  71yo with a bladder calculus  Plan:  The risks/benefits/alternatives to cystolithalopaxy was explained to the patient and he understands and wishes to proceed with surgery  Nicolette Bang 09/17/2016, 9:04 AM

## 2016-09-17 NOTE — Anesthesia Procedure Notes (Addendum)
Procedure Name: LMA Insertion Date/Time: 09/17/2016 9:26 AM Performed by: Tressie Stalker E Pre-anesthesia Checklist: Patient identified, Patient being monitored, Emergency Drugs available, Timeout performed and Suction available Patient Re-evaluated:Patient Re-evaluated prior to induction Oxygen Delivery Method: Circle System Utilized Preoxygenation: Pre-oxygenation with 100% oxygen Induction Type: IV induction Ventilation: Mask ventilation without difficulty LMA: LMA inserted LMA Size: 4.0 Number of attempts: 1 Placement Confirmation: positive ETCO2 and breath sounds checked- equal and bilateral

## 2016-09-19 ENCOUNTER — Encounter (HOSPITAL_COMMUNITY): Payer: Self-pay | Admitting: Urology

## 2016-09-21 ENCOUNTER — Ambulatory Visit (INDEPENDENT_AMBULATORY_CARE_PROVIDER_SITE_OTHER): Payer: BC Managed Care – PPO | Admitting: Urology

## 2016-09-21 DIAGNOSIS — N401 Enlarged prostate with lower urinary tract symptoms: Secondary | ICD-10-CM

## 2016-09-21 DIAGNOSIS — R310 Gross hematuria: Secondary | ICD-10-CM | POA: Diagnosis not present

## 2016-09-24 LAB — STONE ANALYSIS
CA PHOS CRY STONE QL IR: 3 %
Ca Oxalate,Monohydr.: 5 %
Stone Weight KSTONE: 2933.3 mg
URIC ACID: 92 %

## 2016-09-26 ENCOUNTER — Ambulatory Visit (INDEPENDENT_AMBULATORY_CARE_PROVIDER_SITE_OTHER): Payer: BC Managed Care – PPO | Admitting: Urology

## 2016-09-26 DIAGNOSIS — N401 Enlarged prostate with lower urinary tract symptoms: Secondary | ICD-10-CM

## 2016-10-05 ENCOUNTER — Encounter (HOSPITAL_COMMUNITY): Payer: Self-pay | Admitting: Oncology

## 2016-10-05 ENCOUNTER — Encounter (HOSPITAL_COMMUNITY): Payer: BC Managed Care – PPO | Attending: Oncology | Admitting: Oncology

## 2016-10-05 ENCOUNTER — Encounter (HOSPITAL_COMMUNITY): Payer: BC Managed Care – PPO

## 2016-10-05 DIAGNOSIS — C9110 Chronic lymphocytic leukemia of B-cell type not having achieved remission: Secondary | ICD-10-CM | POA: Diagnosis not present

## 2016-10-05 DIAGNOSIS — C8305 Small cell B-cell lymphoma, lymph nodes of inguinal region and lower limb: Secondary | ICD-10-CM | POA: Diagnosis not present

## 2016-10-05 DIAGNOSIS — C8300 Small cell B-cell lymphoma, unspecified site: Secondary | ICD-10-CM

## 2016-10-05 LAB — COMPREHENSIVE METABOLIC PANEL
ALK PHOS: 47 U/L (ref 38–126)
ALT: 30 U/L (ref 17–63)
AST: 18 U/L (ref 15–41)
Albumin: 3.9 g/dL (ref 3.5–5.0)
Anion gap: 8 (ref 5–15)
BILIRUBIN TOTAL: 0.2 mg/dL — AB (ref 0.3–1.2)
BUN: 18 mg/dL (ref 6–20)
CALCIUM: 9.3 mg/dL (ref 8.9–10.3)
CO2: 25 mmol/L (ref 22–32)
CREATININE: 0.97 mg/dL (ref 0.61–1.24)
Chloride: 106 mmol/L (ref 101–111)
GFR calc Af Amer: >60 mL/min (ref >60)
Glucose, Bld: 113 mg/dL — ABNORMAL HIGH (ref 65–99)
POTASSIUM: 3.9 mmol/L (ref 3.5–5.1)
Sodium: 139 mmol/L (ref 135–145)
TOTAL PROTEIN: 6.9 g/dL (ref 6.5–8.1)

## 2016-10-05 LAB — CBC WITH DIFFERENTIAL/PLATELET
BASOS ABS: 0 10*3/uL (ref 0.0–0.1)
Basophils Relative: 0 %
Eosinophils Absolute: 0.5 10*3/uL (ref 0.0–0.7)
Eosinophils Relative: 7 %
HEMATOCRIT: 40 % (ref 39.0–52.0)
Hemoglobin: 12.9 g/dL — ABNORMAL LOW (ref 13.0–17.0)
LYMPHS PCT: 24 %
Lymphs Abs: 1.7 10*3/uL (ref 0.7–4.0)
MCH: 28.4 pg (ref 26.0–34.0)
MCHC: 32.3 g/dL (ref 30.0–36.0)
MCV: 87.9 fL (ref 78.0–100.0)
MONOS PCT: 10 %
Monocytes Absolute: 0.7 10*3/uL (ref 0.1–1.0)
NEUTROS PCT: 59 %
Neutro Abs: 4.1 10*3/uL (ref 1.7–7.7)
Platelets: 244 10*3/uL (ref 150–400)
RBC: 4.55 MIL/uL (ref 4.22–5.81)
RDW: 14 % (ref 11.5–15.5)
WBC: 7 10*3/uL (ref 4.0–10.5)

## 2016-10-05 NOTE — Progress Notes (Addendum)
[facility] Initial Visit:  Patient Care Team: [facility] as PCP - General  CHIEF COMPLAINTS/PURPOSE OF CONSULTATION:  CLL/SLL  HISTORY OF PRESENTING ILLNESS: Cory Lynch 70 y.o. 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 Patient was initially seen Dr. Alyson Ingles for hematuria. He went to [facility] ER on 07/13/2016 with worsening gross hematuria underwent CT which showed a 2.3 cm bladder calculus and multiple enlarged pelvic lymph nodes. On 08/29/2016 he underwent biopsy of left inguinal deep pelvic lymph node with surgical path demonstrating SLL/CLL. Most recent bloodwork from 08/15/2016 demonstrated WBC 6.8K, hemoglobin 13.3 g/dL, hematocrit 39.3%, platelet count 226K. Patient states that his hematuria has resolved. Overall he feels well. He denies any chest pain, shortness breath, abdominal pain. He denies any drenching night sweats, weight loss, fevers or chills.  Review of Systems - Oncology ROS as per HPI otherwise 12 point ROS is negative.   MEDICAL HISTORY: Past Medical History:  Diagnosis Date  . Diabetes mellitus without complication ([place])   . Hypercholesteremia   . Hypertension     SURGICAL HISTORY: Past Surgical History:  Procedure Laterality Date  . CYSTOSCOPY WITH LITHOLAPAXY N/A 09/17/2016   Procedure: CYSTOSCOPY WITH LITHOLAPAXY;  Surgeon: Cleon Gustin, MD;  Location: [facility];  Service: Urology;  Laterality: N/A;  30 MINS [phone] [id number]  . HOLMIUM LASER APPLICATION N/A 5/85/2778   Procedure: HOLMIUM LASER APPLICATION;  Surgeon: Cleon Gustin, MD;  Location: [facility];  Service: Urology;  Laterality: N/A;    SOCIAL HISTORY: Social History   Social History  . Marital status: Single    Spouse name: N/A  . Number of children: N/A  . Years of education: N/A   Occupational History  . Not on file.   Social History Main Topics  . Smoking status: Former Smoker     Packs/day: 0.25    Types: Cigarettes    Quit date: 09/14/2013  . Smokeless tobacco: Never Used  . Alcohol use No  . Drug use: No  . Sexual activity: Not on file   Other Topics Concern  . Not on file   Social History Narrative  . No narrative on file    FAMILY HISTORY Family History  Problem Relation Age of Onset  . Cancer Brother     ALLERGIES:  has No Known Allergies.  MEDICATIONS:  Current Outpatient Prescriptions  Medication Sig Dispense Refill  . aspirin EC 81 MG tablet Take 81 mg by mouth daily.    Marland Kitchen atorvastatin (LIPITOR) 40 MG tablet Take 40 mg by mouth at bedtime.    . betamethasone dipropionate (DIPROLENE) 0.05 % cream     . lisinopril (PRINIVIL,ZESTRIL) 5 MG tablet Take 5 mg by mouth daily.    . meloxicam (MOBIC) 7.5 MG tablet     . Omega-3 Fatty Acids (FISH OIL) 1000 MG CAPS Take 1 capsule by mouth daily.    Marland Kitchen oxyCODONE-acetaminophen (ROXICET) 5-325 MG tablet Take 1 tablet by mouth every 6 (six) hours as needed. 30 tablet 0  . tamsulosin (FLOMAX) 0.4 MG CAPS capsule Take 1 capsule (0.4 mg total) by mouth daily after supper. 30 capsule 11   No current facility-administered medications for this visit.     PHYSICAL EXAMINATION:  ECOG PERFORMANCE STATUS: 0 - Asymptomatic  Vitals reviewed.   Physical Exam Constitutional: Well-developed, well-nourished, and in no distress.   HENT:  Head: Normocephalic and atraumatic.  Mouth/Throat: No oropharyngeal exudate. Mucosa moist. Eyes: Pupils are equal, round,  and reactive to light. Conjunctivae are normal. No scleral icterus.  Neck: Normal range of motion. Neck supple. No JVD present.  Cardiovascular: Normal rate, regular rhythm and normal heart sounds.  Exam reveals no gallop and no friction rub.   No murmur heard. Pulmonary/Chest: Effort normal and breath sounds normal. No respiratory distress. No wheezes.No rales.  Abdominal: Soft. Bowel sounds are normal. No distension. There is no tenderness. There is no  guarding.  Musculoskeletal: No edema or tenderness.  Lymphadenopathy:    No cervical or supraclavicular adenopathy.  Neurological: Alert and oriented to person, place, and time. No cranial nerve deficit.  Skin: Skin is warm and dry. No rash noted. No erythema. No pallor.  Psychiatric: Affect and judgment normal.   LABORATORY DATA: I have personally reviewed the data as listed:  Appointment on 10/05/2016  Component Date Value Ref Range Status  . WBC 10/05/2016 7.0  4.0 - 10.5 K/uL Final  . RBC 10/05/2016 4.55  4.22 - 5.81 MIL/uL Final  . Hemoglobin 10/05/2016 12.9* 13.0 - 17.0 g/dL Final  . HCT 10/05/2016 40.0  39.0 - 52.0 % Final  . MCV 10/05/2016 87.9  78.0 - 100.0 fL Final  . MCH 10/05/2016 28.4  26.0 - 34.0 pg Final  . MCHC 10/05/2016 32.3  30.0 - 36.0 g/dL Final  . RDW 10/05/2016 14.0  11.5 - 15.5 % Final  . Platelets 10/05/2016 244  150 - 400 K/uL Final  . Neutrophils Relative % 10/05/2016 59  % Final  . Neutro Abs 10/05/2016 4.1  1.7 - 7.7 K/uL Final  . Lymphocytes Relative 10/05/2016 24  % Final  . Lymphs Abs 10/05/2016 1.7  0.7 - 4.0 K/uL Final  . Monocytes Relative 10/05/2016 10  % Final  . Monocytes Absolute 10/05/2016 0.7  0.1 - 1.0 K/uL Final  . Eosinophils Relative 10/05/2016 7  % Final  . Eosinophils Absolute 10/05/2016 0.5  0.0 - 0.7 K/uL Final  . Basophils Relative 10/05/2016 0  % Final  . Basophils Absolute 10/05/2016 0.0  0.0 - 0.1 K/uL Final  . Sodium 10/05/2016 139  135 - 145 mmol/L Final  . Potassium 10/05/2016 3.9  3.5 - 5.1 mmol/L Final  . Chloride 10/05/2016 106  101 - 111 mmol/L Final  . CO2 10/05/2016 25  22 - 32 mmol/L Final  . Glucose, Bld 10/05/2016 113* 65 - 99 mg/dL Final  . BUN 10/05/2016 18  6 - 20 mg/dL Final  . Creatinine, Ser 10/05/2016 0.97  0.61 - 1.24 mg/dL Final  . Calcium 10/05/2016 9.3  8.9 - 10.3 mg/dL Final  . Total Protein 10/05/2016 6.9  6.5 - 8.1 g/dL Final  . Albumin 10/05/2016 3.9  3.5 - 5.0 g/dL Final  . AST 10/05/2016 18  15  - 41 U/L Final  . ALT 10/05/2016 30  17 - 63 U/L Final  . Alkaline Phosphatase 10/05/2016 47  38 - 126 U/L Final  . Total Bilirubin 10/05/2016 0.2* 0.3 - 1.2 mg/dL Final  . GFR calc non Af Amer 10/05/2016 >60  >60 mL/min Final  . GFR calc Af Amer 10/05/2016 >60  >60 mL/min Final   Comment: (NOTE) The eGFR has been calculated using the CKD EPI equation. This calculation has not been validated in all clinical situations. eGFR's persistently <60 mL/min signify possible Chronic Kidney Disease.   . Anion gap 10/05/2016 8  5 - 15 Final  Admission on 09/17/2016, Discharged on 09/17/2016  Component Date Value Ref Range Status  . Glucose-Capillary 09/17/2016 129*  65 - 99 mg/dL Final  . Glucose-Capillary 09/17/2016 120* 65 - 99 mg/dL Final  . Color 09/17/2016 Orange   Final  . Size 09/17/2016 Comment  mm Final   Specimen received as fragments.  Joaquim Lai Weight KSTONE 09/17/2016 2933.3  mg Corrected  . Nidus 09/17/2016 No Nidus visualized   Corrected  . Ca Oxalate,Monohydr. 09/17/2016 5  % Corrected  . Ca phos cry stone ql IR 09/17/2016 3  % Corrected  . Uric Acid 09/17/2016 92  % Corrected  . Composition 09/17/2016 Comment   Corrected   Percentage (Represents the % composition)  . STONE COMMENT 09/17/2016 Note:   Corrected   Comment: (NOTE) Please do not submit specimens on Q-Tips, in tape, on filters, or in liquids such as blood, urine or formalin.  This may cause unnecessary biohazards, erroneous results and/or delay in the processing of the specimen.   . Photo 09/17/2016 Comment   Corrected   Photograph will follow under separate cover.  . Comment: 09/17/2016 Comment   Corrected   Comment: (NOTE) Physician questions regarding Calculi Analysis contact [facility] at: [phone].   Marland Kitchen PLEASE NOTE: 09/17/2016 Comment   Corrected   Comment: (NOTE) Calculi report with photograph will follow via computer, mail or courier delivery.   . Disclaimer - Kidney Stone Analysis: 09/17/2016  Comment   Corrected   Comment: (NOTE) This test was developed and its performance characteristics determined by [facility]. It has not been cleared or approved by the Food and Drug Administration. Performed At: [facility] [place], [place] [id number] Lindon Romp MD [id number] Performed at [facility], [place] [address], [place], [place] [address]     RADIOGRAPHIC STUDIES: I have personally reviewed the radiological images as listed and agree with the findings in the report CT ABDOMEN PELVIS W [place] (Accession [id number]) (Order [id number])  Imaging  Date: 07/13/2016 Department: [place] Released By: Beckey Rutter Authorizing: Carmin Muskrat, MD  Exam Information   Status Exam Begun  Exam Ended   Final [99] 07/13/2016 8:46 PM 07/13/2016 9:15 PM  PACS Images   Show images for CT ABDOMEN PELVIS W WO CONTRAST  Study Result   CLINICAL DATA:  Intermittent hematuria but worsening 2 gross hematuria more recently. Diabetes.  EXAM: CT ABDOMEN AND PELVIS WITHOUT AND WITH CONTRAST  TECHNIQUE: Multidetector CT imaging of the abdomen and pelvis was performed following the standard protocol before and following the bolus administration of intravenous contrast.  CONTRAST:  142m ISOVUE-300 IOPAMIDOL (ISOVUE-300) INJECTION 61%  COMPARISON:  None.  FINDINGS: Lower chest: Unremarkable  Hepatobiliary: Unremarkable  Pancreas: Unremarkable  Spleen: Unremarkable  Adrenals/Urinary Tract: 1.2 by 2.8 cm left adrenal mass, density -3 Hounsfield units, compatible with adenoma.  There is a 2.3 cm stone loose within the urinary bladder. No other urinary tract calculi are observed.  3.6 cm Bosniak category 1 cyst exophytic from the right kidney lower pole, no significant degree of enhancement. Several other hypodense renal lesions are technically nonspecific due to small size although probably  cysts.  Mass like indentation of the base of the urinary bladder due to what is believed to likely be a prominent median lobe of the prostate gland.  Stomach/Bowel: Unremarkable  Vascular/Lymphatic: Aortoiliac atherosclerotic vascular disease.  Multiple small periaortic lymph nodes are present. Pathologically enlarged right common iliac node 1.6 cm in short axis on image 44/9.  Pathologically enlarged right external iliac lymph nodes are present including a 1.8 cm in short axis  node on image 65/9.  Pathologically enlarged left iliac node along the iliac bifurcation, 1.4 cm in diameter on image 51/9.  Pathologically enlarged left external iliac nodes including a 2.1 cm in short axis node on image 64/9.  Reproductive: Prominent prostate gland, measuring 5.3 by 5.1 by 7.4 cm (volume = 100 cm^3), with a focal prominent median lobe indenting the bladder base, and with some fullness of the seminal vesicles for example on images 63-64 series 9.  Other: No supplemental non-categorized findings.  Musculoskeletal: Multilevel facet arthropathy in the lumbar spine, with spondylosis, congenitally short pedicles, degenerative disc disease, and spurring contributing to impingement at the L4-5 and L5-S1 levels.  IMPRESSION: 1. 2.3 cm loose urinary bladder stone. 2. Pathologically enlarged pelvic adenopathy suspicious for malignancy. Possibilities might include lymphoma or prostate cancer. Tissue diagnosis likely warranted. 3. Considerable enlargement of the prostate gland, volume estimated at 100 cc, with a focally prominent median lobe indenting the bladder base, and with some associated fullness of the seminal vesicles. In light of the pelvic adenopathy, workup by PSA level and assessment the prostate is suggested. 4. 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 5.   Aortic Atherosclerosis (ICD10-I70.0). 6. Left adrenal adenoma. 7. Right kidney lower pole cyst, along with several other hypodense renal lesions which are likely cysts but which are technically too small to characterize.   Electronically Signed   By: Van Clines M.D.   On: 07/13/2016 22:06    PATHOLOGY: Patient: Cory Lynch, Cory Lynch Collected: 08/15/2016 Client: [facility] Accession: [id number] Received: 08/15/2016 Jacqulynn Cadet, MD DOB: 10-16-1945 Age: 89 Gender: M Reported: 08/17/2016 [place] Patient Ph: [phone] MRN #: [id number] [place], [place] [address] Visit #: [id number] Chart #: Phone: [phone] Fax: CC: Irine Seal, MD REPORT OF SURGICAL PATHOLOGY FINAL DIAGNOSIS Diagnosis Lymph node, needle/core biopsy, right deep inguinal - SMALL LYMPHOCYTIC LYMPHOMA. - SEE ONCOLOGY TABLE. Microscopic Comment LYMPHOMA Histologic type: Non-Hodgkin's lymphoma, small lymphocytic type. Grade (if applicable): Low grade. Flow cytometry: Monoclonal, kappa restricted B-cell population expressing pan B-cell antigens including CD20 associated with CD5 expression ([id number]). Immunohistochemical stains: CD20, CD79a, CD3, CD5, CD10, and Cyclin D-1 with appropriate controls. Touch preps/imprints: Not performed. Comments: The sections show needle core biopsy fragments of lymph nodal tissue displaying proliferation of a relatively monomorphic population of small round to slightly irregular lymphocytes with high nuclear cytoplasmic ratio, dense chromatin and small to inconspicuous nucleoli. The appearance is diffuse with lack of atypical follicles. Flow cytometric analysis was performed ([id number]) and shows a monoclonal, kappa restricted B-cell population expressing pan B-cell antigens including CD20 associated with CD5 expression. No CD10 expression is identified. Immunohistochemical stains were also performed and show that the small lymphoid cells are  predominantly composed of B-cells as seen with CD79a and CD20 (variable expression) associated with CD5 coexpression. No CD10 or Cyclin D-1 positivity is identified. There is a admixed minor population of T-cells as seen with CD3. The overall morphologic and immunophenotypic features are consistent with small lymphocytic lymphoma/chronic lymphocytic leukemia. 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 No evidence of leukocytosis or cytopenias. No B symptoms. Patient asymptomatic.  Discussed diagnosis with the patient. Incidental finding from CT abd/pelvis for hematuria evaluation. Patient currently asymptomatic from SLL without any evidence of CLL with indications to initiate treatment. Continue observation at this time. Will repeat  CT C/A/P 1-2 days prior to next visit with labs. RTC in 4 months for follow up.   Orders Placed This Encounter  Procedures  . CT Abdomen Pelvis W Contrast    Standing Status:   Future    Standing Expiration Date:   10/05/2017    Order Specific Question:   If indicated for the ordered procedure, I authorize the administration of contrast media per Radiology protocol    Answer:   Yes    Order Specific Question:   Preferred imaging location?    Answer:   [facility]    Order Specific Question:   Radiology Contrast Protocol - do NOT remove file path    Answer:   \\[email or web address].pdf  . CT Chest W Contrast    Standing Status:   Future    Standing Expiration Date:   10/05/2017    Order Specific Question:   If indicated for the ordered procedure, I authorize the administration of contrast media per Radiology protocol    Answer:   Yes    Order Specific Question:   Preferred imaging location?    Answer:   [facility]    Order Specific Question:   Radiology Contrast Protocol - do NOT remove file path     Answer:   \\[email or web address].pdf  . CBC with Differential    Standing Status:   Standing    Number of Occurrences:   2    Standing Expiration Date:   10/05/2017  . Comprehensive metabolic panel    Standing Status:   Standing    Number of Occurrences:   2    Standing Expiration Date:   10/05/2017    All questions were answered. The patient knows to call the clinic with any problems, questions or concerns.  This note was electronically signed.    Twana First, MD  10/05/2016 4:01 PM

## 2016-10-05 NOTE — Patient Instructions (Signed)
[facility] at [facility] Discharge Instructions  RECOMMENDATIONS MADE BY THE CONSULTANT AND ANY TEST RESULTS WILL BE SENT TO YOUR REFERRING PHYSICIAN.  You saw Dr. Zhou today.  Thank you for choosing [facility] at [facility] to provide your oncology and hematology care.  To afford each patient quality time with our provider, please arrive at least 15 minutes before your scheduled appointment time.    If you have a lab appointment with the Cancer Center please come in thru the  Main Entrance and check in at the main information desk  You need to re-schedule your appointment should you arrive 10 or more minutes late.  We strive to give you quality time with our providers, and arriving late affects you and other patients whose appointments are after yours.  Also, if you no show three or more times for appointments you may be dismissed from the clinic at the providers discretion.     Again, thank you for choosing [facility].  Our hope is that these requests will decrease the amount of time that you wait before being seen by our physicians.       _____________________________________________________________  Should you have questions after your visit to [facility], please contact our office at [phone] between the hours of 8:30 a.m. and 4:30 p.m.  Voicemails left after 4:30 p.m. will not be returned until the following business day.  For prescription refill requests, have your pharmacy contact our office.       Resources For Cancer Patients and their Caregivers ? American Cancer Society: Can assist with transportation, wigs, general needs, runs Look Good Feel Better.        1-888-227-6333 ? Cancer Care: Provides financial assistance, online support groups, medication/co-pay assistance.  1-800-813-HOPE (4673) ? [facility] Assists [place] cancer patients and their families through  emotional , educational and financial support.  336-427-4357 ? [place] DSS Where to apply for food stamps, Medicaid and utility assistance. 336-342-1394 ? [facility]: Transportation to medical appointments. 336-347-2287 ? Social Security Administration: May apply for disability if have a Stage IV cancer. 336-342-7796 1-800-772-1213 ? [place] Aging, Disability and Transit Services: Assists with nutrition, care and transit needs. 336-349-2343  Cancer Center Support Programs: @10RELATIVEDAYS@ > Cancer Support Group  2nd Tuesday of the month 1pm-2pm, Journey Room  > Creative Journey  3rd Tuesday of the month 1130am-1pm, Journey Room  > Look Good Feel Better  1st Wednesday of the month 10am-12 noon, Journey Room (Call American Cancer Society to register 1-800-395-5775)    

## 2016-12-26 ENCOUNTER — Ambulatory Visit: Payer: BC Managed Care – PPO | Admitting: Urology

## 2017-01-25 ENCOUNTER — Other Ambulatory Visit (HOSPITAL_COMMUNITY): Payer: Self-pay | Admitting: *Deleted

## 2017-01-25 DIAGNOSIS — C9110 Chronic lymphocytic leukemia of B-cell type not having achieved remission: Secondary | ICD-10-CM

## 2017-01-28 ENCOUNTER — Encounter (HOSPITAL_COMMUNITY): Payer: BC Managed Care – PPO | Attending: Oncology

## 2017-01-28 DIAGNOSIS — C9190 Lymphoid leukemia, unspecified not having achieved remission: Secondary | ICD-10-CM | POA: Diagnosis present

## 2017-01-28 DIAGNOSIS — C9110 Chronic lymphocytic leukemia of B-cell type not having achieved remission: Secondary | ICD-10-CM

## 2017-01-28 LAB — COMPREHENSIVE METABOLIC PANEL
ALBUMIN: 3.9 g/dL (ref 3.5–5.0)
ALT: 48 U/L (ref 17–63)
ANION GAP: 10 (ref 5–15)
AST: 28 U/L (ref 15–41)
Alkaline Phosphatase: 55 U/L (ref 38–126)
BILIRUBIN TOTAL: 0.5 mg/dL (ref 0.3–1.2)
BUN: 17 mg/dL (ref 6–20)
CO2: 23 mmol/L (ref 22–32)
Calcium: 9.3 mg/dL (ref 8.9–10.3)
Chloride: 105 mmol/L (ref 101–111)
Creatinine, Ser: 1.07 mg/dL (ref 0.61–1.24)
GFR calc Af Amer: >60 mL/min (ref >60)
GFR calc non Af Amer: >60 mL/min (ref >60)
GLUCOSE: 197 mg/dL — AB (ref 65–99)
POTASSIUM: 3.9 mmol/L (ref 3.5–5.1)
Sodium: 138 mmol/L (ref 135–145)
TOTAL PROTEIN: 7.5 g/dL (ref 6.5–8.1)

## 2017-01-28 LAB — CBC WITH DIFFERENTIAL/PLATELET
BASOS PCT: 1 %
Basophils Absolute: 0.1 10*3/uL (ref 0.0–0.1)
EOS ABS: 1.8 10*3/uL — AB (ref 0.0–0.7)
EOS PCT: 27 %
HCT: 43.2 % (ref 39.0–52.0)
Hemoglobin: 13.6 g/dL (ref 13.0–17.0)
Lymphocytes Relative: 19 %
Lymphs Abs: 1.2 10*3/uL (ref 0.7–4.0)
MCH: 28 pg (ref 26.0–34.0)
MCHC: 31.5 g/dL (ref 30.0–36.0)
MCV: 89.1 fL (ref 78.0–100.0)
MONO ABS: 0.5 10*3/uL (ref 0.1–1.0)
MONOS PCT: 8 %
NEUTROS ABS: 3 10*3/uL (ref 1.7–7.7)
Neutrophils Relative %: 45 %
Platelets: 221 10*3/uL (ref 150–400)
RBC: 4.85 MIL/uL (ref 4.22–5.81)
RDW: 13.5 % (ref 11.5–15.5)
WBC: 6.6 10*3/uL (ref 4.0–10.5)

## 2017-01-31 ENCOUNTER — Ambulatory Visit (HOSPITAL_COMMUNITY): Payer: BC Managed Care – PPO

## 2017-02-04 ENCOUNTER — Other Ambulatory Visit: Payer: Self-pay

## 2017-02-04 ENCOUNTER — Inpatient Hospital Stay (HOSPITAL_COMMUNITY): Payer: BC Managed Care – PPO | Attending: Hematology and Oncology | Admitting: Oncology

## 2017-02-04 ENCOUNTER — Encounter (HOSPITAL_COMMUNITY): Payer: Self-pay | Admitting: Oncology

## 2017-02-04 VITALS — BP 146/72 | HR 86 | Temp 98.3°F | Resp 18 | Ht 67.0 in | Wt 213.5 lb

## 2017-02-04 DIAGNOSIS — C8305 Small cell B-cell lymphoma, lymph nodes of inguinal region and lower limb: Secondary | ICD-10-CM | POA: Insufficient documentation

## 2017-02-04 DIAGNOSIS — E119 Type 2 diabetes mellitus without complications: Secondary | ICD-10-CM | POA: Diagnosis not present

## 2017-02-04 DIAGNOSIS — Z87891 Personal history of nicotine dependence: Secondary | ICD-10-CM

## 2017-02-04 DIAGNOSIS — R21 Rash and other nonspecific skin eruption: Secondary | ICD-10-CM

## 2017-02-04 DIAGNOSIS — I10 Essential (primary) hypertension: Secondary | ICD-10-CM | POA: Diagnosis not present

## 2017-02-04 DIAGNOSIS — C9110 Chronic lymphocytic leukemia of B-cell type not having achieved remission: Secondary | ICD-10-CM

## 2017-02-04 NOTE — Progress Notes (Signed)
[facility] Initial Visit:  Patient Care Team: [facility] as PCP - General  CHIEF COMPLAINTS/PURPOSE OF CONSULTATION:  CLL/SLL  HISTORY OF PRESENTING ILLNESS: Cory Lynch 72 y.o. 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 Patient was initially seen Dr. Alyson Ingles for hematuria. He went to [facility] on 07/13/2016 with worsening gross hematuria underwent CT which showed a 2.3 cm bladder calculus and multiple enlarged pelvic lymph nodes. On 08/29/2016 he underwent biopsy of left inguinal deep pelvic lymph node with surgical path demonstrating SLL/CLL.   Most recent blood work from 01/28/2017 demonstrated WBC 6.6K, hemoglobin 13.6 g/dL, hematocrit 43.2%, platelet count 220 1K.  Patient offers no complaints today.  Overall he feels great.  His appetite is 100% energy level is 100%.  Patient states his appetite is so good he has gained weight.  He denies any pain.  He does complain of a rash on left forearm that is "almost gone".  He states this rash is from his cholesterol medication that he recently stopped.  He denies any drenching night sweats, weight loss, fevers or chills.   Review of Systems  Constitutional: Negative for appetite change, fatigue, fever and unexpected weight change.  HENT:  Negative.   Gastrointestinal: Negative.  Negative for blood in stool and constipation.  Endocrine: Negative for hot flashes.  Genitourinary: Negative.    Musculoskeletal: Negative.   Skin: Positive for itching and rash.       Left forearm.  Almost completely resolved after stopping cholesterol medication.  Hematological: Negative.   Psychiatric/Behavioral: Negative.    ROS as per HPI otherwise 12 point ROS is negative.   MEDICAL HISTORY: Past Medical History:  Diagnosis Date  . Diabetes mellitus without complication ([place])   . Hypercholesteremia   . Hypertension     SURGICAL HISTORY: Past Surgical History:  Procedure Laterality Date    . CYSTOSCOPY WITH LITHOLAPAXY N/A 09/17/2016   Procedure: CYSTOSCOPY WITH LITHOLAPAXY;  Surgeon: Cleon Gustin, MD;  Location: [facility];  Service: Urology;  Laterality: N/A;  30 MINS [phone] [id number]  . HOLMIUM LASER APPLICATION N/A 10/01/4095   Procedure: HOLMIUM LASER APPLICATION;  Surgeon: Cleon Gustin, MD;  Location: [facility];  Service: Urology;  Laterality: N/A;    SOCIAL HISTORY: Social History   Socioeconomic History  . Marital status: Single    Spouse name: Not on file  . Number of children: Not on file  . Years of education: Not on file  . Highest education level: Not on file  Social Needs  . Financial resource strain: Not on file  . Food insecurity - worry: Not on file  . Food insecurity - inability: Not on file  . Transportation needs - medical: Not on file  . Transportation needs - non-medical: Not on file  Occupational History  . Not on file  Tobacco Use  . Smoking status: Former Smoker    Packs/day: 0.25    Types: Cigarettes    Last attempt to quit: 09/14/2013    Years since quitting: 3.3  . Smokeless tobacco: Never Used  Substance and Sexual Activity  . Alcohol use: No  . Drug use: No  . Sexual activity: Not on file  Other Topics Concern  . Not on file  Social History Narrative  . Not on file    FAMILY HISTORY Family History  Problem Relation Age of Onset  . Cancer Brother     ALLERGIES:  has No Known Allergies.  MEDICATIONS:  Current Outpatient Medications  Medication Sig Dispense Refill  . metFORMIN (GLUCOPHAGE) 500 MG tablet     . Omega-3 Fatty Acids (FISH OIL) 1000 MG CAPS Take 1 capsule by mouth daily.    . tamsulosin (FLOMAX) 0.4 MG CAPS capsule Take 1 capsule (0.4 mg total) by mouth daily after supper. 30 capsule 11   No current facility-administered medications for this visit.     PHYSICAL EXAMINATION:  ECOG PERFORMANCE STATUS: 0 - Asymptomatic  Vitals reviewed.   Physical Exam   Constitutional: He is oriented to person, place, and time and well-developed, well-nourished, and in no distress.  HENT:  Head: Normocephalic and atraumatic.  Eyes: Pupils are equal, round, and reactive to light.  Neck: Normal range of motion. Neck supple.  Cardiovascular: Normal rate and regular rhythm.  Pulmonary/Chest: Effort normal and breath sounds normal.  Abdominal: Soft. Bowel sounds are normal.  Musculoskeletal: Normal range of motion.  Neurological: He is alert and oriented to person, place, and time. Gait normal.  Skin: Skin is warm and dry. Rash noted. Rash is maculopapular.      LABORATORY DATA: I have personally reviewed the data as listed:  Appointment on 01/28/2017  Component Date Value Ref Range Status  . WBC 01/28/2017 6.6  4.0 - 10.5 K/uL Final  . RBC 01/28/2017 4.85  4.22 - 5.81 MIL/uL Final  . Hemoglobin 01/28/2017 13.6  13.0 - 17.0 g/dL Final  . HCT 01/28/2017 43.2  39.0 - 52.0 % Final  . MCV 01/28/2017 89.1  78.0 - 100.0 fL Final  . MCH 01/28/2017 28.0  26.0 - 34.0 pg Final  . MCHC 01/28/2017 31.5  30.0 - 36.0 g/dL Final  . RDW 01/28/2017 13.5  11.5 - 15.5 % Final  . Platelets 01/28/2017 221  150 - 400 K/uL Final  . Neutrophils Relative % 01/28/2017 45  % Final  . Neutro Abs 01/28/2017 3.0  1.7 - 7.7 K/uL Final  . Lymphocytes Relative 01/28/2017 19  % Final  . Lymphs Abs 01/28/2017 1.2  0.7 - 4.0 K/uL Final  . Monocytes Relative 01/28/2017 8  % Final  . Monocytes Absolute 01/28/2017 0.5  0.1 - 1.0 K/uL Final  . Eosinophils Relative 01/28/2017 27  % Final  . Eosinophils Absolute 01/28/2017 1.8* 0.0 - 0.7 K/uL Final  . Basophils Relative 01/28/2017 1  % Final  . Basophils Absolute 01/28/2017 0.1  0.0 - 0.1 K/uL Final  . Sodium 01/28/2017 138  135 - 145 mmol/L Final  . Potassium 01/28/2017 3.9  3.5 - 5.1 mmol/L Final  . Chloride 01/28/2017 105  101 - 111 mmol/L Final  . CO2 01/28/2017 23  22 - 32 mmol/L Final  . Glucose, Bld 01/28/2017 197* 65 - 99  mg/dL Final  . BUN 01/28/2017 17  6 - 20 mg/dL Final  . Creatinine, Ser 01/28/2017 1.07  0.61 - 1.24 mg/dL Final  . Calcium 01/28/2017 9.3  8.9 - 10.3 mg/dL Final  . Total Protein 01/28/2017 7.5  6.5 - 8.1 g/dL Final  . Albumin 01/28/2017 3.9  3.5 - 5.0 g/dL Final  . AST 01/28/2017 28  15 - 41 U/L Final  . ALT 01/28/2017 48  17 - 63 U/L Final  . Alkaline Phosphatase 01/28/2017 55  38 - 126 U/L Final  . Total Bilirubin 01/28/2017 0.5  0.3 - 1.2 mg/dL Final  . GFR calc non Af Amer 01/28/2017 >60  >60 mL/min Final  . GFR calc Af Amer 01/28/2017 >60  >60 mL/min Final  Comment: (NOTE) The eGFR has been calculated using the CKD EPI equation. This calculation has not been validated in all clinical situations. eGFR's persistently <60 mL/min signify possible Chronic Kidney Disease.   . Anion gap 01/28/2017 10  5 - 15 Final    RADIOGRAPHIC STUDIES: I have personally reviewed the radiological images as listed and agree with the findings in the report CT ABDOMEN PELVIS W [place] (Accession [id number]) (Order [id number])  Imaging  Date: 07/13/2016 Department: [place] Released By: Beckey Rutter Authorizing: Carmin Muskrat, MD  Exam Information   Status Exam Begun  Exam Ended   Final [99] 07/13/2016 8:46 PM 07/13/2016 9:15 PM  PACS Images   Show images for CT ABDOMEN PELVIS W WO CONTRAST  Study Result   CLINICAL DATA:  Intermittent hematuria but worsening 2 gross hematuria more recently. Diabetes.  EXAM: CT ABDOMEN AND PELVIS WITHOUT AND WITH CONTRAST  TECHNIQUE: Multidetector CT imaging of the abdomen and pelvis was performed following the standard protocol before and following the bolus administration of intravenous contrast.  CONTRAST:  130m ISOVUE-300 IOPAMIDOL (ISOVUE-300) INJECTION 61%  COMPARISON:  None.  FINDINGS: Lower chest: Unremarkable  Hepatobiliary: Unremarkable  Pancreas: Unremarkable  Spleen:  Unremarkable  Adrenals/Urinary Tract: 1.2 by 2.8 cm left adrenal mass, density -3 Hounsfield units, compatible with adenoma.  There is a 2.3 cm stone loose within the urinary bladder. No other urinary tract calculi are observed.  3.6 cm Bosniak category 1 cyst exophytic from the right kidney lower pole, no significant degree of enhancement. Several other hypodense renal lesions are technically nonspecific due to small size although probably cysts.  Mass like indentation of the base of the urinary bladder due to what is believed to likely be a prominent median lobe of the prostate gland.  Stomach/Bowel: Unremarkable  Vascular/Lymphatic: Aortoiliac atherosclerotic vascular disease.  Multiple small periaortic lymph nodes are present. Pathologically enlarged right common iliac node 1.6 cm in short axis on image 44/9.  Pathologically enlarged right external iliac lymph nodes are present including a 1.8 cm in short axis node on image 65/9.  Pathologically enlarged left iliac node along the iliac bifurcation, 1.4 cm in diameter on image 51/9.  Pathologically enlarged left external iliac nodes including a 2.1 cm in short axis node on image 64/9.  Reproductive: Prominent prostate gland, measuring 5.3 by 5.1 by 7.4 cm (volume = 100 cm^3), with a focal prominent median lobe indenting the bladder base, and with some fullness of the seminal vesicles for example on images 63-64 series 9.  Other: No supplemental non-categorized findings.  Musculoskeletal: Multilevel facet arthropathy in the lumbar spine, with spondylosis, congenitally short pedicles, degenerative disc disease, and spurring contributing to impingement at the L4-5 and L5-S1 levels.  IMPRESSION: 1. 2.3 cm loose urinary bladder stone. 2. Pathologically enlarged pelvic adenopathy suspicious for malignancy. Possibilities might include lymphoma or prostate cancer. Tissue diagnosis likely warranted. 3.  Considerable enlargement of the prostate gland, volume estimated at 100 cc, with a focally prominent median lobe indenting the bladder base, and with some associated fullness of the seminal vesicles. In light of the pelvic adenopathy, workup by PSA level and assessment the prostate is suggested. 4. 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 5.  Aortic Atherosclerosis (ICD10-I70.0). 6. Left adrenal adenoma. 7. Right kidney lower pole cyst, along with several other hypodense renal lesions which are likely cysts but which are technically too small to  characterize.   Electronically Signed   By: Van Clines M.D.   On: 07/13/2016 22:06    PATHOLOGY: Patient: Cory Lynch, Cory Lynch Collected: 08/15/2016 Client: [facility] Accession: [id number] Received: 08/15/2016 Jacqulynn Cadet, MD DOB: January 13, 1946 Age: 58 Gender: M Reported: 08/17/2016 [place] Patient Ph: [phone] MRN #: [id number] [place], [place] [address] Visit #: [id number].[id number] Chart #: Phone: [phone] Fax: CC: Irine Seal, MD REPORT OF SURGICAL PATHOLOGY FINAL DIAGNOSIS Diagnosis Lymph node, needle/core biopsy, right deep inguinal - SMALL LYMPHOCYTIC LYMPHOMA. - SEE ONCOLOGY TABLE. Microscopic Comment LYMPHOMA Histologic type: Non-Hodgkin's lymphoma, small lymphocytic type. Grade (if applicable): Low grade. Flow cytometry: Monoclonal, kappa restricted B-cell population expressing pan B-cell antigens including CD20 associated with CD5 expression ([id number]). Immunohistochemical stains: CD20, CD79a, CD3, CD5, CD10, and Cyclin D-1 with appropriate controls. Touch preps/imprints: Not performed. Comments: The sections show needle core biopsy fragments of lymph nodal tissue displaying proliferation of a relatively monomorphic population of small round to slightly irregular lymphocytes  with high nuclear cytoplasmic ratio, dense chromatin and small to inconspicuous nucleoli. The appearance is diffuse with lack of atypical follicles. Flow cytometric analysis was performed ([id number]) and shows a monoclonal, kappa restricted B-cell population expressing pan B-cell antigens including CD20 associated with CD5 expression. No CD10 expression is identified. Immunohistochemical stains were also performed and show that the small lymphoid cells are predominantly composed of B-cells as seen with CD79a and CD20 (variable expression) associated with CD5 coexpression. No CD10 or Cyclin D-1 positivity is identified. There is a admixed minor population of T-cells as seen with CD3. The overall morphologic and immunophenotypic features are consistent with small lymphocytic lymphoma/chronic lymphocytic leukemia. 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  Patient is asymptomatic.  He denies B symptoms.  Blood work shows no evidence of leukocytosis or cytopenias.  Mild eosinophilia.  Patient denies any allergies.  Patient was supposed to have CT abdomen/pelvis completed prior to this visit unfortunately it was not scheduled.  Will get CT scan scheduled in the next week or so.  We will call him with results.  We will continue observation at this time.  Patient is to return in 4 months for MD assess and follow-up with repeat imaging 1-2 days prior to next visit with labs.  Orders placed.  Left forearm maculopapular rash: Patient states rash is much better since coming off his cholesterol medication.  No intervention needed at this time.  Orders Placed This Encounter  Procedures  . CT Chest W Contrast    Standing Status:   Future    Standing Expiration Date:   06/04/2017    Order Specific Question:   If indicated for the ordered procedure, I authorize the administration of  contrast media per Radiology protocol    Answer:   Yes    Order Specific Question:   Preferred imaging location?    Answer:   [facility]    Order Specific Question:   Radiology Contrast Protocol - do NOT remove file path    Answer:   [email or web address]  . CT Abdomen Pelvis W Contrast    Standing Status:   Future    Standing Expiration Date:   06/04/2017    Order Specific Question:   If indicated for the ordered procedure, I authorize the administration of contrast media per Radiology protocol    Answer:   Yes    Order Specific Question:  Preferred imaging location?    Answer:   [facility]    Order Specific Question:   Radiology Contrast Protocol - do NOT remove file path    Answer:   [email or web address]    All questions were answered. The patient knows to call the clinic with any problems, questions or concerns.  This note was electronically signed.    Jacquelin Hawking, NP  02/04/2017 1:03 PM

## 2017-02-06 ENCOUNTER — Ambulatory Visit (INDEPENDENT_AMBULATORY_CARE_PROVIDER_SITE_OTHER): Payer: BC Managed Care – PPO | Admitting: Urology

## 2017-02-06 DIAGNOSIS — N401 Enlarged prostate with lower urinary tract symptoms: Secondary | ICD-10-CM | POA: Diagnosis not present

## 2017-02-06 DIAGNOSIS — R310 Gross hematuria: Secondary | ICD-10-CM

## 2017-02-18 ENCOUNTER — Ambulatory Visit (HOSPITAL_COMMUNITY)
Admission: RE | Admit: 2017-02-18 | Discharge: 2017-02-18 | Disposition: A | Discharge status: Home / Self Care | Payer: BC Managed Care – PPO | Source: Ambulatory Visit | Attending: Oncology | Admitting: Oncology

## 2017-02-18 DIAGNOSIS — R590 Localized enlarged lymph nodes: Secondary | ICD-10-CM | POA: Diagnosis not present

## 2017-02-18 DIAGNOSIS — N400 Enlarged prostate without lower urinary tract symptoms: Secondary | ICD-10-CM | POA: Diagnosis not present

## 2017-02-18 DIAGNOSIS — C9110 Chronic lymphocytic leukemia of B-cell type not having achieved remission: Secondary | ICD-10-CM

## 2017-02-18 DIAGNOSIS — C9190 Lymphoid leukemia, unspecified not having achieved remission: Secondary | ICD-10-CM | POA: Diagnosis not present

## 2017-02-18 DIAGNOSIS — I700 Atherosclerosis of aorta: Secondary | ICD-10-CM | POA: Insufficient documentation

## 2017-02-18 DIAGNOSIS — I2510 Atherosclerotic heart disease of native coronary artery without angina pectoris: Secondary | ICD-10-CM | POA: Insufficient documentation

## 2017-02-18 MED ORDER — IOPAMIDOL (ISOVUE-300) INJECTION 61%
100.0000 mL | Freq: Once | INTRAVENOUS | Status: AC | PRN
  Administered 2017-02-18: 100 mL via INTRAVENOUS

## 2017-02-19 ENCOUNTER — Encounter (HOSPITAL_COMMUNITY): Payer: Self-pay | Admitting: Oncology

## 2017-02-19 NOTE — Progress Notes (Signed)
CT C/A/P reveal  extensive lymphadenopathy most evident in the pelvis, but also evident in the axillary regions bilaterally, compatible with the reported clinical history of CLL. This is a new finding.  Spleen is unremarkable.  His most recent lab work was unremarkable. Patient was feeling well and denied any B symptoms. We will continue to monitor at this time. Per NCCN guidelines, he does not meet requirments for treatment.   Faythe Casa, NP 02/19/2017 4:18 PM

## 2017-06-04 ENCOUNTER — Ambulatory Visit (HOSPITAL_COMMUNITY): Payer: BC Managed Care – PPO | Admitting: Internal Medicine

## 2017-06-04 ENCOUNTER — Other Ambulatory Visit (HOSPITAL_COMMUNITY): Payer: BC Managed Care – PPO

## 2017-10-02 ENCOUNTER — Ambulatory Visit (INDEPENDENT_AMBULATORY_CARE_PROVIDER_SITE_OTHER): Payer: BC Managed Care – PPO | Admitting: Urology

## 2017-10-02 DIAGNOSIS — R310 Gross hematuria: Secondary | ICD-10-CM

## 2017-10-02 DIAGNOSIS — N401 Enlarged prostate with lower urinary tract symptoms: Secondary | ICD-10-CM | POA: Diagnosis not present

## 2018-01-14 ENCOUNTER — Ambulatory Visit: Admit: 2018-01-14 | Discharge: 2018-01-15 | Payer: MEDICARE

## 2018-01-14 DIAGNOSIS — R9431 Abnormal electrocardiogram [ECG] [EKG]: Secondary | ICD-10-CM

## 2018-01-14 DIAGNOSIS — R42 Dizziness and giddiness: Secondary | ICD-10-CM

## 2018-01-14 DIAGNOSIS — W19XXXA Unspecified fall, initial encounter: Principal | ICD-10-CM

## 2018-01-14 DIAGNOSIS — R55 Syncope and collapse: Secondary | ICD-10-CM

## 2018-01-14 MED ORDER — ATORVASTATIN 40 MG TABLET
ORAL_TABLET | Freq: Every day | ORAL | 6 refills | 0.00000 days | Status: CP
Start: 2018-01-14 — End: 2018-08-14

## 2018-02-06 ENCOUNTER — Ambulatory Visit: Admit: 2018-02-06 | Discharge: 2018-02-07 | Payer: MEDICARE

## 2018-02-06 DIAGNOSIS — R9431 Abnormal electrocardiogram [ECG] [EKG]: Principal | ICD-10-CM

## 2018-03-03 ENCOUNTER — Ambulatory Visit: Admit: 2018-03-03 | Discharge: 2018-03-03 | Payer: MEDICARE

## 2018-03-03 DIAGNOSIS — C8400 Mycosis fungoides, unspecified site: Principal | ICD-10-CM

## 2018-03-03 DIAGNOSIS — C8590 Non-Hodgkin lymphoma, unspecified, unspecified site: Principal | ICD-10-CM

## 2018-03-14 ENCOUNTER — Encounter: Admit: 2018-03-14 | Discharge: 2018-03-14 | Payer: MEDICARE

## 2018-03-14 DIAGNOSIS — C8400 Mycosis fungoides, unspecified site: Principal | ICD-10-CM

## 2018-04-07 ENCOUNTER — Encounter (HOSPITAL_COMMUNITY): Payer: Self-pay | Admitting: Emergency Medicine

## 2018-04-07 ENCOUNTER — Other Ambulatory Visit: Payer: Self-pay

## 2018-04-07 ENCOUNTER — Emergency Department (HOSPITAL_COMMUNITY)
Admission: EM | Admit: 2018-04-07 | Discharge: 2018-04-07 | Disposition: A | Discharge status: Home / Self Care | Payer: BC Managed Care – PPO | Attending: Emergency Medicine | Admitting: Emergency Medicine

## 2018-04-07 ENCOUNTER — Emergency Department (HOSPITAL_COMMUNITY): Payer: BC Managed Care – PPO

## 2018-04-07 DIAGNOSIS — Z79899 Other long term (current) drug therapy: Secondary | ICD-10-CM | POA: Insufficient documentation

## 2018-04-07 DIAGNOSIS — Z87891 Personal history of nicotine dependence: Secondary | ICD-10-CM | POA: Diagnosis not present

## 2018-04-07 DIAGNOSIS — C9110 Chronic lymphocytic leukemia of B-cell type not having achieved remission: Secondary | ICD-10-CM | POA: Diagnosis not present

## 2018-04-07 DIAGNOSIS — M79672 Pain in left foot: Secondary | ICD-10-CM

## 2018-04-07 DIAGNOSIS — E119 Type 2 diabetes mellitus without complications: Secondary | ICD-10-CM | POA: Diagnosis not present

## 2018-04-07 DIAGNOSIS — I10 Essential (primary) hypertension: Secondary | ICD-10-CM | POA: Insufficient documentation

## 2018-04-07 DIAGNOSIS — M2012 Hallux valgus (acquired), left foot: Secondary | ICD-10-CM | POA: Insufficient documentation

## 2018-04-07 DIAGNOSIS — Z7984 Long term (current) use of oral hypoglycemic drugs: Secondary | ICD-10-CM | POA: Diagnosis not present

## 2018-04-07 LAB — CBG MONITORING, ED: GLUCOSE-CAPILLARY: 150 mg/dL — AB (ref 70–99)

## 2018-04-07 MED ORDER — TRAMADOL HCL 50 MG PO TABS: 50.0000 mg | ORAL_TABLET | Freq: Four times a day (QID) | ORAL | 0 refills | Status: DC | PRN

## 2018-04-07 NOTE — ED Triage Notes (Signed)
Pain to bottom of foot when standing that started today, denies any injuries

## 2018-04-07 NOTE — Discharge Instructions (Addendum)
Elevate your foot and apply ice packs on and off.  It is also important that you wear proper fitting supportive shoes.  Call your foot doctor tomorrow to arrange a follow-up appointment.

## 2018-04-09 NOTE — ED Provider Notes (Signed)
[facility] EMERGENCY DEPARTMENT Provider Note   CSN: [id number] Arrival date & time: 04/07/18  1613    History   Chief Complaint No chief complaint on file.   HPI Cory Lynch is a 73 y.o. male.     HPI   Cory Lynch is a 73 y.o. male with a past medical history of diabetes, who presents to the Emergency Department complaining of pain to the plantar surface of his left foot.  Symptoms began on the morning of arrival.  He describes a sharp pain across the ball of his foot that is associated with weightbearing.  He denies injury, open wounds or sores, swelling, numbness or pain proximal to his foot.  He also denies new foot wear.  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 Diabetes mellitus without complication ([place])   . Hypercholesteremia   . Hypertension     Patient Active Problem List   Diagnosis Date Noted  . CLL (chronic lymphocytic leukemia) ([place]) 10/05/2016    Past Surgical History:  Procedure Laterality Date  . CYSTOSCOPY WITH LITHOLAPAXY N/A 09/17/2016   Procedure: CYSTOSCOPY WITH LITHOLAPAXY;  Surgeon: Cleon Gustin, MD;  Location: [facility];  Service: Urology;  Laterality: N/A;  30 MINS [phone] [id number]  . HOLMIUM LASER APPLICATION N/A 9/62/2297   Procedure: HOLMIUM LASER APPLICATION;  Surgeon: Cleon Gustin, MD;  Location: [facility];  Service: Urology;  Laterality: N/A;        Home Medications    Prior to Admission medications   Medication Sig Start Date End Date Taking? Authorizing Provider  metFORMIN (GLUCOPHAGE) 500 MG tablet  01/30/17   [provider]  Omega-3 Fatty Acids (FISH OIL) 1000 MG CAPS Take 1 capsule by mouth daily.    [provider]  tamsulosin (FLOMAX) 0.4 MG CAPS capsule Take 1 capsule (0.4 mg total) by mouth daily after supper. 09/17/16   McKenzie, Candee Furbish, MD  traMADol (ULTRAM) 50 MG  tablet Take 1 tablet (50 mg total) by mouth every 6 (six) hours as needed. 04/07/18   Kem Parkinson, PA-C    Family History Family History  Problem Relation Age of Onset  . Cancer Brother     Social History Social History   Tobacco Use  . Smoking status: Former Smoker    Packs/day: 0.25    Types: Cigarettes    Last attempt to quit: 09/14/2013    Years since quitting: 4.5  . Smokeless tobacco: Never Used  Substance Use Topics  . Alcohol use: No  . Drug use: No     Allergies   Patient has no known allergies.   Review of Systems Review of Systems  Constitutional: Negative for chills and fever.  Musculoskeletal: Positive for arthralgias. Negative for back pain and joint swelling.  Skin: Negative for color change, rash and wound.  Neurological: Negative for weakness and numbness.     Physical Exam Updated Vital Signs BP 120/69 (BP Location: Right Arm)   Pulse 91   Temp 98 F (36.7 C) (Oral)   Resp 18   Ht 5\' 7"  (1.702 m)   Wt 90.7 kg   SpO2 93%   BMI 31.32 kg/m   Physical Exam Vitals signs and nursing note reviewed.  Constitutional:      General: He is not in acute distress.    Appearance: Normal appearance. He is well-developed.  Cardiovascular:  Rate and Rhythm: Normal rate and regular rhythm.     Pulses: Normal pulses.  Pulmonary:     Effort: Pulmonary effort is normal.     Breath sounds: Normal breath sounds.  Musculoskeletal:        General: Tenderness present. No deformity or signs of injury.     Right lower leg: No edema.     Left lower leg: No edema.     Comments: Diffuse tenderness to palpation of the plantar surface of the forefoot.  Patient does have a hallux valgus deformity of the foot.  No erythema, edema, or skin changes.  No open wounds of the foot or toes.  No pain proximal to the foot.  Skin:    General: Skin is warm.     Capillary Refill: Capillary refill takes less than 2 seconds.     Findings: No erythema or rash.  Neurological:      General: No focal deficit present.     Mental Status: He is alert.     Sensory: No sensory deficit.     Motor: No weakness or abnormal muscle tone.      ED Treatments / Results  Labs (all labs ordered are listed, but only abnormal results are displayed) Labs Reviewed  CBG MONITORING, ED - Abnormal; Notable for the following components:      Result Value   Glucose-Capillary 150 (*)    All other components within normal limits    EKG None  Radiology Dg Foot Complete Left  Result Date: 04/07/2018 CLINICAL DATA:  73 y/o M; pain to the bottom of the foot near the ball when standing at work today. EXAM: LEFT FOOT - COMPLETE 3+ VIEW COMPARISON:  None. FINDINGS: There is no evidence of fracture or dislocation. Small dorsal and plantar calcaneal enthesophytes. Hallux valgus. Mild osteoarthrosis of the first metatarsophalangeal joint with periarticular osteophytosis. IMPRESSION: 1. No acute fracture or dislocation. 2. Hallux valgus and mild first metatarsophalangeal joint osteoarthrosis. 3. Small dorsal and plantar calcaneal enthesophytes. Electronically Signed   By: Kristine Garbe M.D.   On: 04/07/2018 18:22    Procedures Procedures (including critical care time)  Medications Ordered in ED Medications - No data to display   Initial Impression / Assessment and Plan / ED Course  I have reviewed the triage vital signs and the nursing notes.  Pertinent labs & imaging results that were available during my care of the patient were reviewed by me and considered in my medical decision making (see chart for details).        Patient with left foot pain.  Neurovascularly intact.  No skin changes or concerning symptoms for cellulitis of the foot.  NV intact.  Skin is warm and dry.  Palpable dorsalis pedis and posterior tibial pulses.  No plantar warts seen.  Unclear etiology of source of patient's forefoot pain.  Possibly related to poorly fitting footwear.  Patient has a  podiatrist, who agrees to arrange follow-up.  Final Clinical Impressions(s) / ED Diagnoses   Final diagnoses:  Foot pain, left  Hallux valgus of left foot    ED Discharge Orders         Ordered    traMADol (ULTRAM) 50 MG tablet  Every 6 hours PRN     04/07/18 1857           Kem Parkinson, PA-C 04/09/18 1543    Noemi Chapel, MD 04/11/18 [phone]

## 2018-06-20 IMAGING — CT CT BIOPSY
1 of 2 series · 14 of 32 positions shown, 19 images · non-contrast
Comparison: none

INDICATION: 71-year-old male with prostatomegaly, hematuria and pelvic
lymphadenopathy. He presents for CT-guided biopsy.

[Series 2: i-spiral 5.0 b31f · axial · 0.94mm/px · z∈[-211,-60]mm · 14 of 49 slices shown, 19 images]
[im 3/49  soft-tissue]
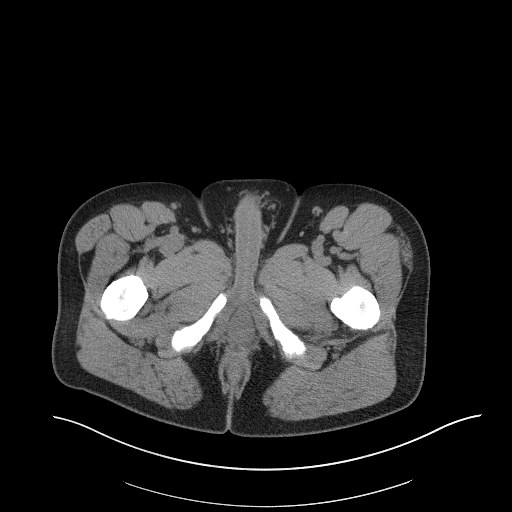
[im 3/49  bone]
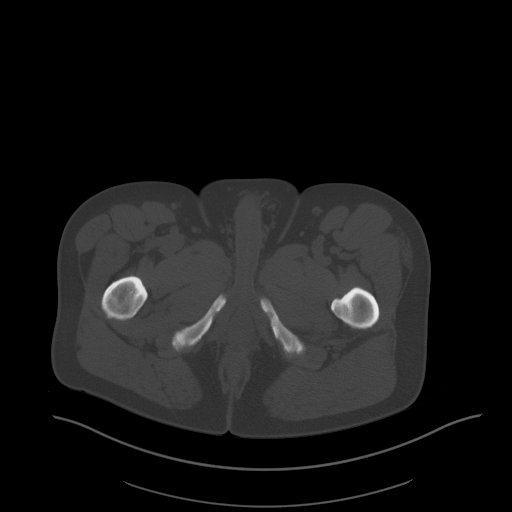
[im 8/49  soft-tissue]
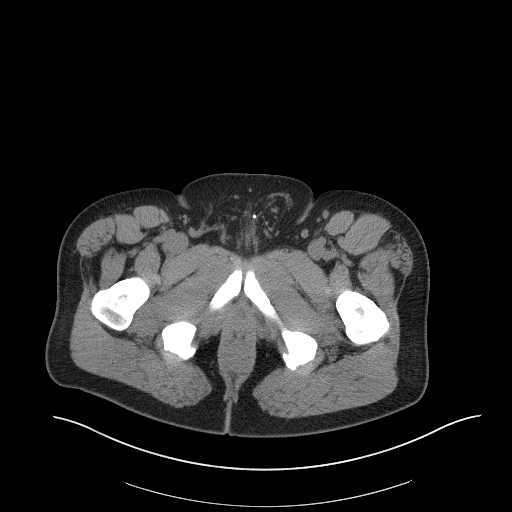
[im 10/49  soft-tissue]
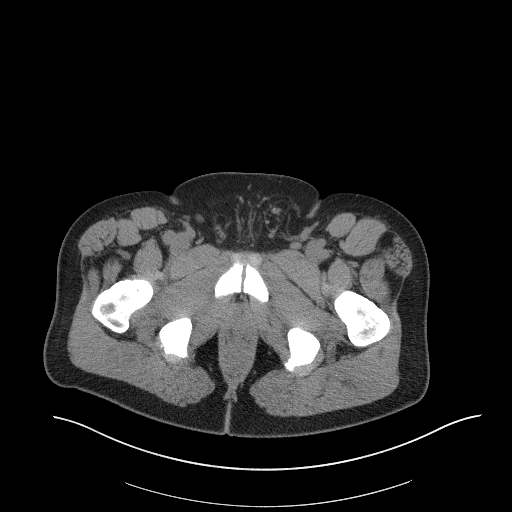
[im 15/49  soft-tissue]
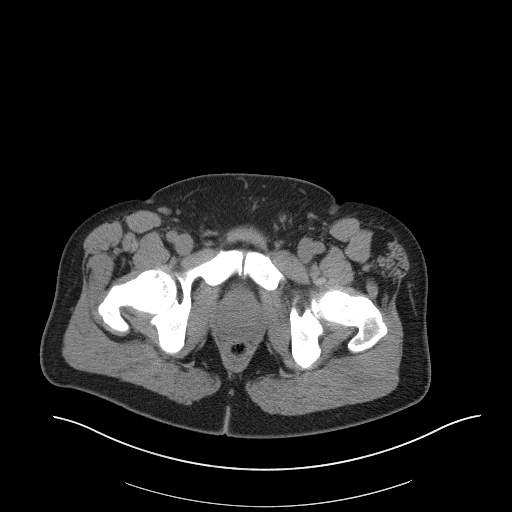
[im 17/49  soft-tissue]
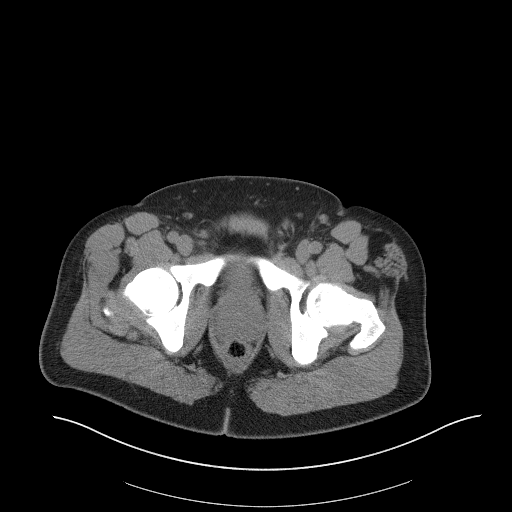
[im 22/49  soft-tissue]
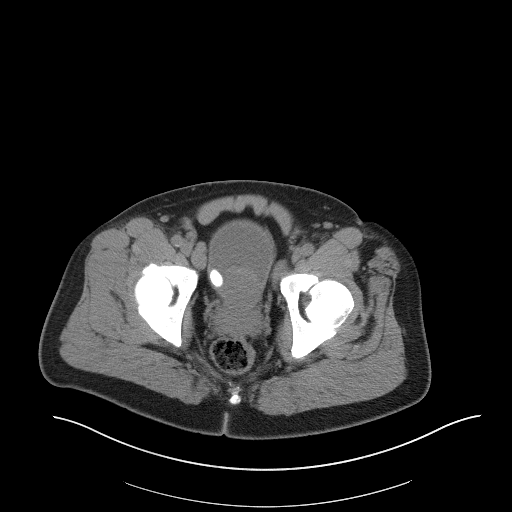
[im 25/49  soft-tissue]
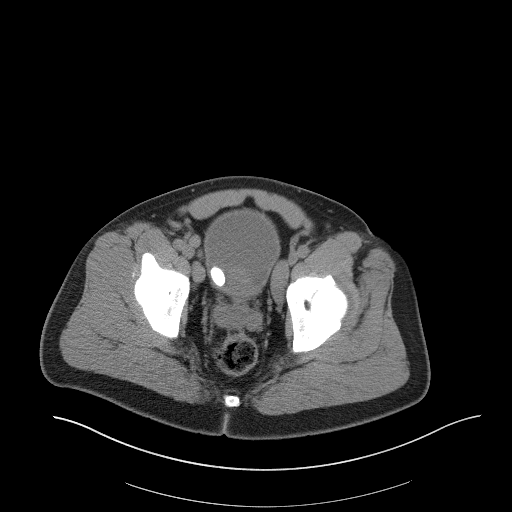
[im 27/49  soft-tissue]
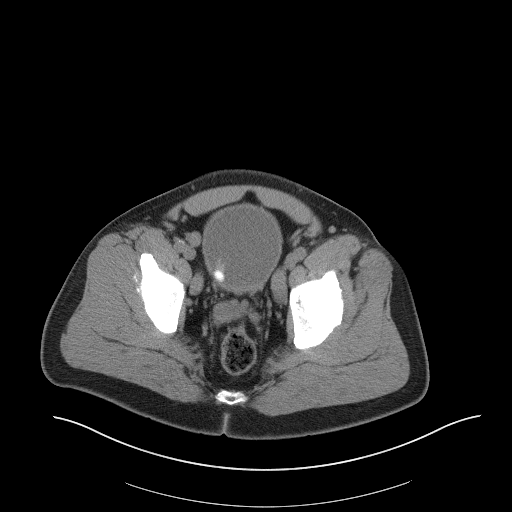
[im 32/49  soft-tissue]
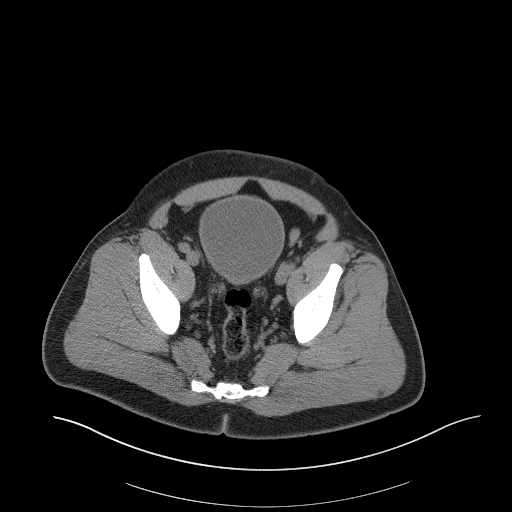
[im 32/49  bone]
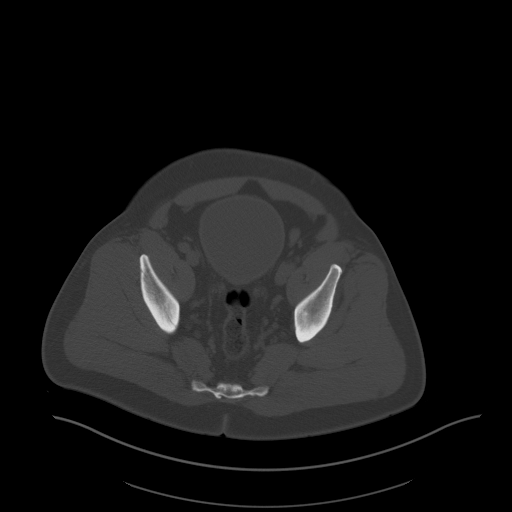
[im 34/49  soft-tissue]
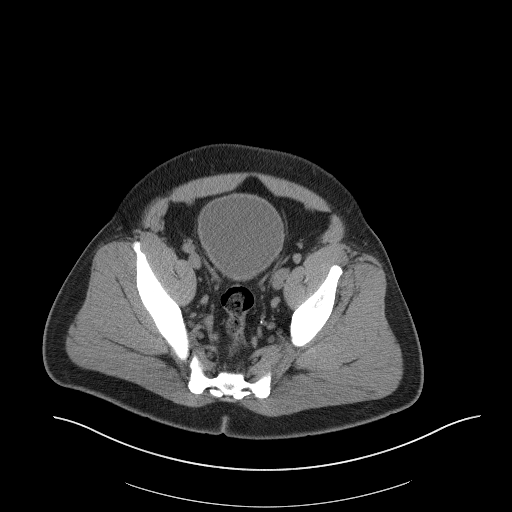
[im 39/49  soft-tissue]
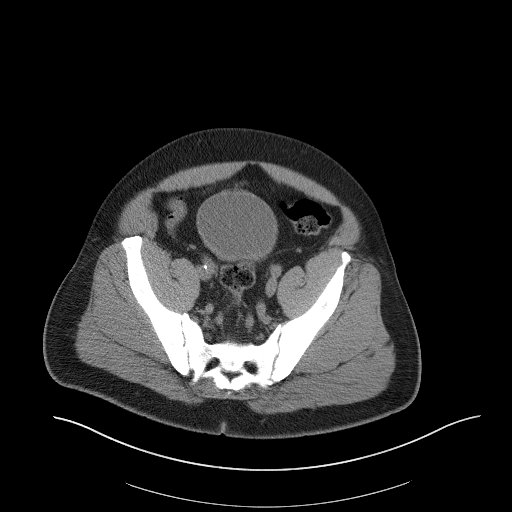
[im 39/49  lung]
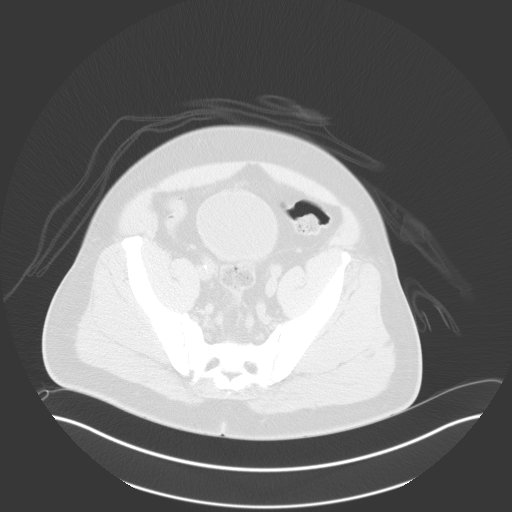
[im 41/49  soft-tissue]
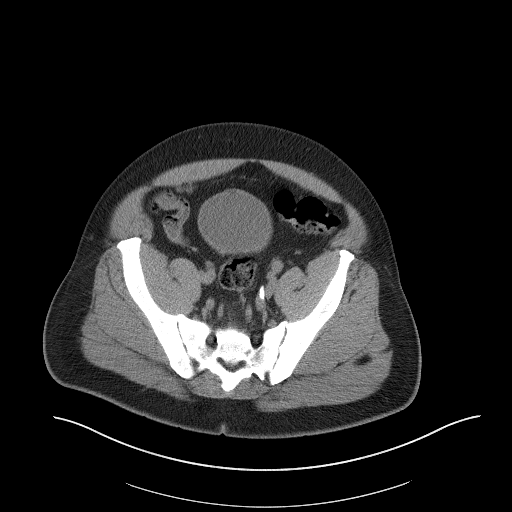
[im 41/49  lung]
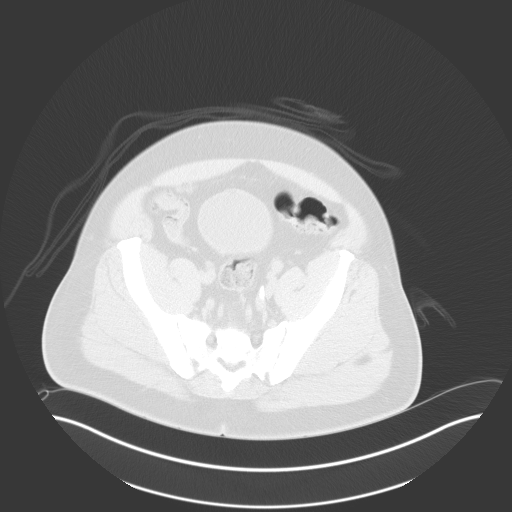
[im 44/49  lung]
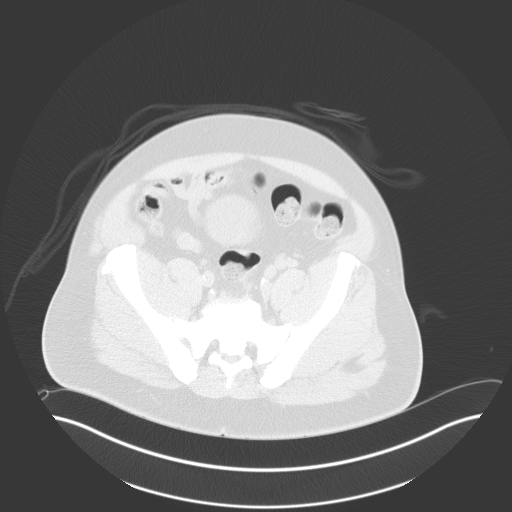
[im 46/49  soft-tissue]
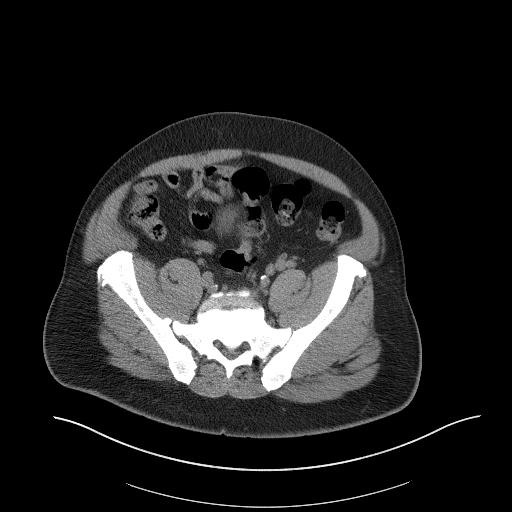
[im 46/49  lung]
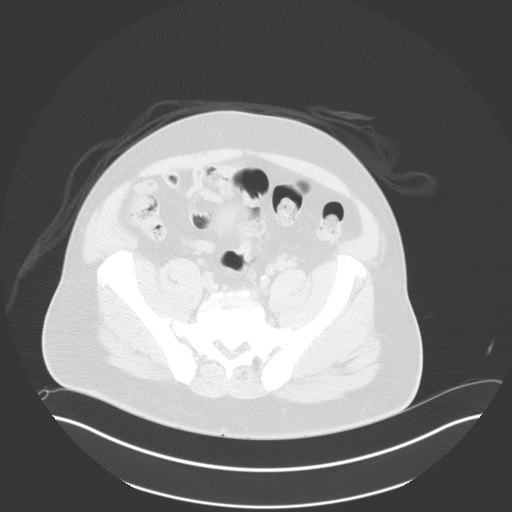

[14 of 32 positions shown; findings below may reference images not displayed]

EXAM:
CT BIOPSY

MEDICATIONS:
None.

ANESTHESIA/SEDATION:
Moderate (conscious) sedation was employed during this procedure. A
total of Versed 2 mg and Fentanyl 100 mcg was administered
intravenously.

Moderate Sedation Time: 14 minutes. The patient's level of
consciousness and vital signs were monitored continuously by
radiology nursing throughout the procedure under my direct
supervision.

FLUOROSCOPY TIME:  Fluoroscopy Time: 0 minutes 0 seconds (0 mGy).

COMPLICATIONS:
None immediate.

PROCEDURE:
Informed written consent was obtained from the patient after a
thorough discussion of the procedural risks, benefits and
alternatives. All questions were addressed. Maximal Sterile Barrier
Technique was utilized including caps, mask, sterile gowns, sterile
gloves, sterile drape, hand hygiene and skin antiseptic. A timeout
was performed prior to the initiation of the procedure.

The patient was placed supine on the CT gantry. A planning axial CT
scan was performed. The deep right inguinal lymph node was
successfully identified. A suitable skin entry site was selected and
marked. The overlying skin was prepped and draped in standard
fashion using chlorhexidine skin prep. Local anesthesia was attained
by infiltration with 1% lidocaine. A small dermatotomy was made. A
17 gauge introducer needle was then advanced under intermittent CT
guidance through the anterior abdominal wall and positioned at the
margin of the deep inguinal lymph node. Multiple 18 gauge core
biopsies were then coaxially obtained and placed in saline. The
specimens were delivered to pathology.

The introducer needle was removed. Post biopsy axial CT imaging
demonstrates no evidence of complication. The patient tolerated the
procedure well.
IMPRESSION: Technically successful CT-guided core biopsy of the deep right
inguinal lymph node.

## 2018-06-30 DEATH — deceased

## 2018-07-06 NOTE — Unmapped (Signed)
??  Dermatology Clinic Outpatient     ??  Assessment and Plan:     Mycosis Fungoides Stage IIb, T3NxM0B0, with evidence of folliculotropic infiltrates in patch-plaque lesions as well as more infiltrative tumors and nodules most prominent on the bilateral upper extremities. PET scan showed diffuse adenopathy with minimal/moderate FDG avidity.  - Education was provided by discussing the etiology, natural history, course and treatment for the above conditions.  Reassurance and anticipatory guidance were provided  - After discussion of risks, benefits, and alternative treatment options, a joint decision was made to proceed with bexarotene 150mg  daily (will be sent to [facility] pending lab work)  - Labs today: flow cytometry, LDH, CBC with diff, CMP, total cholesterol, triglycerides, TSH, Free T4  - Start clobetasoL (TEMOVATE) 0.05 % ointment; Apply topically Two (2) times a day. As needed for itching  Dispense: 120 g; Refill: 5  - Start hydrOXYzine (ATARAX) 25 MG tablet; Take 1 tablet (25 mg total) by mouth nightly.  Dispense: 30 tablet; Refill: 5  - cetirizine (ZYRTEC) 10 MG tablet; Take 1 tablet (10 mg total) by mouth daily.  Dispense: 30 tablet; Refill: 5    Return to clinic in [place] in about 6 weeks (around 08/18/2018) for [facility].    The treatment plan was discussed with the patient, who voiced understanding, and all questions were answered.    Subjective:     Chief Complaint: New patient,   Chief Complaint   Patient presents with   ??? Follow-up     Sezary Syndrome / Lymphoma   ??? Pre-Rooming/Consent     completed Ayw   ??? 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 Best Contact Info     [phone]       HPI: This is a 73 y.o. male new patient who is seen today by Aaron Canterbury, MD to establish care for mycosis fungoides. He first noticed red scaly bumps on the arms >1 year ago, which spread to involve the back over time. Denies involvement in other locations including the face, chest, abdomen and legs. Associated symptoms include pruritus. No pain or bleeding. He was seen by a dermatologist previously who started a topical cream. However, patient continues to have significant pruritus which often interferes with sleep.     Biopsy on 03/14/18:  Final Diagnosis   Date Value Ref Range Status   03/14/2018   Final    (Outside Case #:  [id number], dated 12/18/2017)  Skin, left upper arm, shave biopsy  - Atypical T-cell infiltrate, consistent with folliculocentric mycosis fungoides (See Comment)   -  By outside report, molecular studies are positive for a T-cell gene rearrangement; see details in Ancillary Studies      This electronic signature is attestation that the pathologist personally reviewed the submitted material(s) and the final diagnosis reflects that evaluation.        Special Stains:  By outside report, special stains were performed but not provided for our review; see results below.  Special stains for organisms (PAS/F and Treponema pallidum) are performed and are negative.  ??  Outside stains of block 1A are reviewed.  CD3: CD3 stains the lymphoid infiltrate.  CD20:  CD20 stains scattered cells.  CD4:  CD4 stains the majority of cells in the lymphoid infiltrate.  CD8:  CD8 stains a very small subset of the lymphoid cells.  CD30:  CD30 stains scattered  cells.  Langerhans:  Langerhans stains scattered cells.    PET scan:  -Multiple mildly hypermetabolic bilateral cervical lymph nodes involving levels II-V.  - Axillae/subpectoral: Multiple bilateral axillary/subpectoral adenopathy. For example, see 1.6 cm moderately FDG avid right node (CT: 121) and 1.2 cm left axillary node, mildly FDG avid (CT: 119).  - Mediastinum/hila: Scattered subcentimeter mediastinal nodes, all of which are non-FDG avid. For example: 0.7 cm para-aortic node (CT: 19). Minimal coronary calcifications.  - Adenopathy: Multiple mild to moderately hypermetabolic abdominal and pelvic lymph nodes, for example: 1.1 cm retrocaval (CT: 250), 2.2 cm right common iliac (4:283), bilateral external iliac (CT 335) and bilateral subcentimeter inguinal (CT: 371).   - Soft tissue nodularity of the anterior abdominal wall (CT: 237); may represent Pautrier microabscesses (T-cell aggregates in the epidermis) known to be associated with Mycosis Fungoides.    There are no other skin concerns reported today.    Pertinent Past Medical History:   Mycosis Fungoides  Diabetes mellitus, hypertension, hyperlipidemia    Family History:   No family history of melanoma     Social History:   Lives in [place], [place]  Works as a custodian     Review of Systems:  No fever, chills. No other skin complaints. Other than the HPI, the balance of 10 system reviewed is negative.    Objective:     Physical Examination:  General: Well-developed, well-nourished male in no acute distress, resting comfortably.  Neuro: A&Ox 3. Answers questions appropriately.  Psych: Mood and affect appropriate for age  Skin: Examination including inspection and palpation of the face, neck, chest, abdomen, back, bilateral upper extremities, palms, and nails was performed and notable for the following:  - Several erythematous folliculocentric papules, plaques and nodules with overlying scale and crust most predominantly on the bilateral upper extremities (L>R) but also involving the ears and back  - All other areas not specifically commented on are within normal limits.    Lymph: +palpable small mobile cervical adenopathy  ______________________________________________________________________  The patient was seen and examined by Aaron Canterbury, MD who agrees with the assessment and plan as above.

## 2018-07-07 ENCOUNTER — Ambulatory Visit: Admit: 2018-07-07 | Discharge: 2018-07-08 | Payer: MEDICARE | Attending: Dermatology | Primary: Dermatology

## 2018-07-07 DIAGNOSIS — C8400 Mycosis fungoides, unspecified site: Principal | ICD-10-CM

## 2018-07-07 DIAGNOSIS — Z79899 Other long term (current) drug therapy: Secondary | ICD-10-CM

## 2018-07-07 MED ORDER — HYDROXYZINE HCL 25 MG TABLET
ORAL_TABLET | Freq: Every evening | ORAL | 5 refills | 0.00000 days | Status: CP
Start: 2018-07-07 — End: ?

## 2018-07-07 MED ORDER — CETIRIZINE 10 MG TABLET
ORAL_TABLET | Freq: Every day | ORAL | 5 refills | 0.00000 days | Status: CP
Start: 2018-07-07 — End: 2019-07-07

## 2018-07-07 MED ORDER — CLOBETASOL 0.05 % TOPICAL OINTMENT
Freq: Two times a day (BID) | TOPICAL | 5 refills | 0.00000 days | Status: CP
Start: 2018-07-07 — End: ?

## 2018-07-09 MED ORDER — BEXAROTENE 75 MG CAPSULE
Freq: Every day | ORAL | 3 refills | 0.00000 days | Status: CP
Start: 2018-07-09 — End: 2018-07-11

## 2018-07-09 NOTE — Unmapped (Signed)
Per test claim for BEXAROTENE at the [facility], patient needs Medication Assistance Program for Prior Authorization.

## 2018-07-10 NOTE — Unmapped (Signed)
Called patient to discuss lab results. Informed him of positive findings in flow cytometry. We are coordinating care with Oncology, Dr. Elmon Kirschner, to optimize patient's care. In the meantime, his insurance has approved bexarotene as a treatment option and he should be hearing from [facility] pharmacy shortly for shipment of the medication. Enforced the important of close follow up and starting the medication. Patient voiced understanding and appreciative of call.

## 2018-07-10 NOTE — Unmapped (Addendum)
[facility] Specialty Medication Referral: PA Approved      Medication (Brand/Generic): BEXAROTENE (BRAND TARGRETIN DENIED)    Final Test Claim completed with resulted information below:    Patient ABLE to fill at [facility]  Insurance Company:  [employer] [employer] [facility] [employer]  Anticipated Copay: $200  Is anticipated copay with a copay card or grant? No, there is no grant or copay assistance available. FOR GENERIC, FOR BRAND THERE IS ASSISTANCE, BUT THE PRESCRIPTION IS NOT WRITTEN FOR BRAND AND A PA WOULD HAVE TO BE DONE PER REFERRAL.     Does this patient have to receive a partial fill of the medication due to insurance restrictions? NO  If so, please cofirm how many days supply is allowed per plan per fill and how long the patient will have to fill partial months supply for the medication: NOT APPLICABLE     If the copay is under the $25 defined limit, per policy there will be no further investigation of need for financial assistance at this time unless patient requests. This referral has been communicated to the provider and handed off to the [facility] team for further processing and filling of prescribed medication.   ______________________________________________________________________  Please utilize this referral for viewing purposes as it will serve as the central location for all relevant documentation and updates.

## 2018-07-11 MED ORDER — BEXAROTENE 75 MG CAPSULE
ORAL_CAPSULE | Freq: Every day | ORAL | 1 refills | 0.00000 days | Status: CP
Start: 2018-07-11 — End: 2018-07-16

## 2018-07-14 NOTE — Unmapped (Signed)
Per test claim for Name Donah Driver at the [facility], patient needs Medication Assistance Program for Prior Authorization.

## 2018-07-16 MED ORDER — BEXAROTENE 75 MG CAPSULE
ORAL_CAPSULE | Freq: Every day | ORAL | 1 refills | 90.00000 days | Status: CP
Start: 2018-07-16 — End: 2018-08-14
  Filled 2018-07-18: qty 60, 30d supply, fill #0

## 2018-07-18 MED FILL — BEXAROTENE 75 MG CAPSULE: 30 days supply | Qty: 60 | Fill #0 | Status: AC

## 2018-07-18 NOTE — Unmapped (Signed)
[facility]   Patient Onboarding/Medication Counseling    Aaron Wilson is a 73 y.o. male with Mycosis fungoides who I am counseling today on initiation of therapy.  I am speaking to the patient.    Verified patient's date of birth / HIPAA.    Specialty medication(s) to be sent: Hematology/Oncology: Targretin      Non-specialty medications/supplies to be sent: na      Medications not needed at this time: na         Medication & Administration     Dosage/ administration: Take 2 capsules (150 mg) by mouth once daily with food.    Adherence/Missed dose instructions: take missed dose as soon as you remember. If it is close to the time of your next dose, skip the dose and resume with your next scheduled dose.    Goals of Therapy      -     Side Effects & Monitoring Parameters   ? Common side effects  o Nausea/diarrhea  o Feeling more tired or weak  o Dry, flaky skin      The following side effects should be reported to the provider:  ? Severe weakness or fatigue  ? Infection  ? Diarrhea or nausea not relieved by conservative/otc approaches      Contraindications, Warnings, & Precautions     ? Bexarotene is a retinoid, and should be avoided in pregnancy. (Does not apply to this patient)    Drug/Food Interactions     ? Medication list reviewed in Epic. The patient was instructed to inform the care team before taking any new medications or supplements. bexarotene may decrease levels of tamsulosin. Patient counseled to report worsening in BPH symptoms.        Storage, Handling Precautions, & Disposal   ? Store this medication at room temperature.  ? Do not allow others to handle medication, unless they use gloves or other means to avoid exposure (like a paper towel).        Current Medications (including OTC/herbals), Comorbidities and Allergies     Current Outpatient Medications   Medication Sig Dispense Refill   ??? aspirin (ECOTRIN) 81 MG tablet Take 81 mg by mouth once as needed.     ??? atorvastatin (LIPITOR) 40 MG tablet Take 1 tablet (40 mg total) by mouth daily. 30 tablet 6   ??? betamethasone dipropionate (DIPROLENE) 0.05 % ointment Apply topically two (2) times a day.     ??? bexarotene (TARGRETIN) 75 mg capsule Take 2 capsules (150 mg total) by mouth daily. With food 180 capsule 1   ??? cetirizine (ZYRTEC) 10 MG tablet Take 1 tablet (10 mg total) by mouth daily. 30 tablet 5   ??? ciclopirox (LOPROX) 0.77 % cream Apply topically two (2) times a day as needed.     ??? clobetasoL (TEMOVATE) 0.05 % ointment Apply topically Two (2) times a day. As needed for itching 120 g 5   ??? hydrOXYzine (ATARAX) 25 MG tablet Take 1 tablet (25 mg total) by mouth nightly. 30 tablet 5   ??? metFORMIN (GLUCOPHAGE) 500 MG tablet Take 500 mg by mouth two (2) times a day.     ??? omega-3 acid ethyl esters (LOVAZA) 1 gram capsule Take 1 capsule by mouth two (2) times a day.     ??? tamsulosin (FLOMAX) 0.4 mg capsule Take 0.4 mg by mouth nightly.       No current facility-administered medications for this visit.  No Known Allergies    Patient Active Problem List   Diagnosis   ??? Mycosis fungoides (CMS-HCC)       Reviewed and up to date in Epic.    Appropriateness of Therapy     Is medication and dose appropriate based on diagnosis? Yes    Baseline Quality of Life Assessment      How many days over the past month did your MF keep you from your normal activities? 0    Financial Information     Medication Assistance provided: Prior Authorization    Anticipated copay of $200 reviewed with patient. Verified delivery address.    Delivery Information     Scheduled delivery date: Monday, June 22    Expected start date: Monday, June 22    Medication will be delivered via UPS to the prescription address in [place].  This shipment will not require a signature.      Explained the services we provide at [facility] and that each month we would call to set up refills.  Stressed importance of returning phone calls so that we could ensure they receive their medications in time each month.  Informed patient that we should be setting up refills 7-10 days prior to when they will run out of medication.  A pharmacist will reach out to perform a clinical assessment periodically.  Informed patient that a welcome packet and a drug information handout will be sent.      Patient verbalized understanding of the above information as well as how to contact the pharmacy at [phone] option 4 with any questions/concerns.  The pharmacy is open Monday through Friday 8:30am-4:30pm.  A pharmacist is available 24/7 via pager to answer any clinical questions they may have.    Patient Specific Needs     ? Does the patient have any physical, cognitive, or cultural barriers? No    ? Patient prefers to have medications discussed with  Patient     ? Is the patient able to read and understand education materials at a high school level or above? Yes    ? Patient's primary language is  English     ? Is the patient high risk? No     ? Does the patient require a Care Management Plan? No     ? Does the patient require physician intervention or other additional services (i.e. nutrition, smoking cessation, social work)? No      Yug Loria A Desiree Lucy [facility] Specialty Pharmacist

## 2018-08-13 NOTE — Unmapped (Signed)
Aaron Wilson reports doing well on his new bexarotene. He is already noticing improvement in his skin on his arms. He denied any changes in itching though. He has tolerated the medication well, and denied any side effects.    He has 20 capsules left. He has a f/u with oncology tomorrow and is aware of where to go.     [facility] Clinical Assessment & Refill Coordination Note    Aaron Wilson, DOB: Oct 23, 1945  Phone: [phone] (home) [phone] (work)    All above HIPAA information was verified with patient.     Specialty Medication(s):   Hematology/Oncology: Targretin     Current Outpatient Medications   Medication Sig Dispense Refill   ??? aspirin (ECOTRIN) 81 MG tablet Take 81 mg by mouth once as needed.     ??? atorvastatin (LIPITOR) 40 MG tablet Take 1 tablet (40 mg total) by mouth daily. 30 tablet 6   ??? betamethasone dipropionate (DIPROLENE) 0.05 % ointment Apply topically two (2) times a day.     ??? bexarotene (TARGRETIN) 75 mg capsule Take 2 capsules (150 mg total) by mouth daily. With food 180 capsule 1   ??? cetirizine (ZYRTEC) 10 MG tablet Take 1 tablet (10 mg total) by mouth daily. 30 tablet 5   ??? ciclopirox (LOPROX) 0.77 % cream Apply topically two (2) times a day as needed.     ??? clobetasoL (TEMOVATE) 0.05 % ointment Apply topically Two (2) times a day. As needed for itching 120 g 5   ??? hydrOXYzine (ATARAX) 25 MG tablet Take 1 tablet (25 mg total) by mouth nightly. 30 tablet 5   ??? metFORMIN (GLUCOPHAGE) 500 MG tablet Take 500 mg by mouth two (2) times a day.     ??? omega-3 acid ethyl esters (LOVAZA) 1 gram capsule Take 1 capsule by mouth two (2) times a day.     ??? tamsulosin (FLOMAX) 0.4 mg capsule Take 0.4 mg by mouth nightly.       No current facility-administered medications for this visit.         Changes to medications: Aaron Wilson reports no changes at this time.    No Known Allergies    Changes to allergies: No    SPECIALTY MEDICATION ADHERENCE     bexarotene - 20 caps left Specialty medication(s) dose(s) confirmed: Regimen is correct and unchanged.     Are there any concerns with adherence? No    Adherence counseling provided? Not needed    CLINICAL MANAGEMENT AND INTERVENTION      Clinical Benefit Assessment:    Do you feel the medicine is effective or helping your condition? Yes    Clinical Benefit counseling provided? Not needed    Adverse Effects Assessment:    Are you experiencing any side effects? No    Are you experiencing difficulty administering your medicine? No    Quality of Life Assessment:    How many days over the past month did your MF  keep you from your normal activities? For example, brushing your teeth or getting up in the morning. 0    Have you discussed this with your provider? Not needed    Therapy Appropriateness:    Is therapy appropriate? Yes, therapy is appropriate and should be continued    DISEASE/MEDICATION-SPECIFIC INFORMATION      N/A    PATIENT SPECIFIC NEEDS     ? Does the patient have any physical, cognitive, or cultural barriers? No    ? Is  the patient high risk? No     ? Does the patient require a Care Management Plan? No     ? Does the patient require physician intervention or other additional services (i.e. nutrition, smoking cessation, social work)? No      SHIPPING     Specialty Medication(s) to be Shipped:   Hematology/Oncology: Targretin    Other medication(s) to be shipped: na     Changes to insurance: No    Delivery Scheduled: Yes, Expected medication delivery date: Tues, July 21.     Medication will be delivered via UPS to the confirmed prescription address in [facility].    The patient will receive a drug information handout for each medication shipped and additional FDA Medication Guides as required.  Verified that patient has previously received a Conservation officer, historic buildings.    All of the patient's questions and concerns have been addressed.    Lanney Gins   [facility] Specialty Pharmacist

## 2018-08-14 ENCOUNTER — Ambulatory Visit: Admit: 2018-08-14 | Discharge: 2018-08-15 | Payer: MEDICARE | Attending: Dermatology | Primary: Dermatology

## 2018-08-14 ENCOUNTER — Other Ambulatory Visit: Admit: 2018-08-14 | Discharge: 2018-08-15 | Payer: MEDICARE

## 2018-08-14 ENCOUNTER — Ambulatory Visit: Admit: 2018-08-14 | Discharge: 2018-08-15 | Payer: MEDICARE

## 2018-08-14 DIAGNOSIS — C8400 Mycosis fungoides, unspecified site: Principal | ICD-10-CM

## 2018-08-14 DIAGNOSIS — Z79899 Other long term (current) drug therapy: Secondary | ICD-10-CM

## 2018-08-14 DIAGNOSIS — E032 Hypothyroidism due to medicaments and other exogenous substances: Secondary | ICD-10-CM

## 2018-08-14 LAB — COMPREHENSIVE METABOLIC PANEL
ALBUMIN: 4.2 g/dL (ref 3.5–5.0)
ALKALINE PHOSPHATASE: 81 U/L (ref 38–126)
ALT (SGPT): 32 U/L (ref <50)
ANION GAP: 12 mmol/L (ref 7–15)
AST (SGOT): 34 U/L (ref 19–55)
BLOOD UREA NITROGEN: 18 mg/dL (ref 7–21)
BUN / CREAT RATIO: 18
CALCIUM: 9.8 mg/dL (ref 8.5–10.2)
CHLORIDE: 104 mmol/L (ref 98–107)
CO2: 23 mmol/L (ref 22.0–30.0)
CREATININE: 1 mg/dL (ref 0.70–1.30)
EGFR CKD-EPI AA MALE: 87 mL/min/{1.73_m2} (ref >=60)
EGFR CKD-EPI NON-AA MALE: 75 mL/min/{1.73_m2} (ref >=60)
GLUCOSE RANDOM: 126 mg/dL (ref 70–179)
POTASSIUM: 4.2 mmol/L (ref 3.5–5.0)
SODIUM: 139 mmol/L (ref 135–145)

## 2018-08-14 LAB — CBC W/ AUTO DIFF
BASOPHILS ABSOLUTE COUNT: 0.1 10*9/L (ref 0.0–0.1)
BASOPHILS RELATIVE PERCENT: 1 %
EOSINOPHILS ABSOLUTE COUNT: 2.4 10*9/L — ABNORMAL HIGH (ref 0.0–0.4)
EOSINOPHILS RELATIVE PERCENT: 32.4 %
HEMATOCRIT: 42.4 % (ref 41.0–53.0)
HEMOGLOBIN: 13.8 g/dL (ref 13.5–17.5)
LARGE UNSTAINED CELLS: 3 % (ref 0–4)
MEAN CORPUSCULAR HEMOGLOBIN CONC: 32.4 g/dL (ref 31.0–37.0)
MEAN CORPUSCULAR HEMOGLOBIN: 29.1 pg (ref 26.0–34.0)
MEAN CORPUSCULAR VOLUME: 89.6 fL (ref 80.0–100.0)
MEAN PLATELET VOLUME: 8.4 fL (ref 7.0–10.0)
MONOCYTES RELATIVE PERCENT: 4.7 %
NEUTROPHILS ABSOLUTE COUNT: 2.4 10*9/L (ref 2.0–7.5)
NEUTROPHILS RELATIVE PERCENT: 33 %
PLATELET COUNT: 244 10*9/L (ref 150–440)
RED BLOOD CELL COUNT: 4.73 10*12/L (ref 4.50–5.90)
RED CELL DISTRIBUTION WIDTH: 15.5 % — ABNORMAL HIGH (ref 12.0–15.0)
WBC ADJUSTED: 7.4 10*9/L (ref 4.5–11.0)

## 2018-08-14 LAB — CHOLESTEROL: Cholesterol:MCnc:Pt:Ser/Plas:Qn:: 194

## 2018-08-14 LAB — ALKALINE PHOSPHATASE: Alkaline phosphatase:CCnc:Pt:Ser/Plas:Qn:: 81

## 2018-08-14 LAB — MONOCYTES ABSOLUTE COUNT: Lab: 0.4

## 2018-08-14 LAB — FREE T4: Thyroxine.free:MCnc:Pt:Ser/Plas:Qn:: 0.56 — ABNORMAL LOW

## 2018-08-14 LAB — TRIGLYCERIDES: Triglyceride:MCnc:Pt:Ser/Plas:Qn:: 279 — ABNORMAL HIGH

## 2018-08-14 MED ORDER — GABAPENTIN 300 MG CAPSULE
ORAL_CAPSULE | Freq: Every evening | ORAL | 11 refills | 30 days | Status: CP
Start: 2018-08-14 — End: 2019-08-14

## 2018-08-14 MED ORDER — BEXAROTENE 75 MG CAPSULE
ORAL_CAPSULE | Freq: Every day | ORAL | 1 refills | 30 days | Status: CP
Start: 2018-08-14 — End: ?
  Filled 2018-08-18: qty 120, 30d supply, fill #0

## 2018-08-14 NOTE — Unmapped (Addendum)
1. Bexarotene (the cancer pill that you get in the mail) - increase to four pills per day. You can take them all at the same time or you can split it and take 2 in morning and two at night    2. For itching at night - we will send in a prescription for neurontin (gabapentin) - 300mg  at night time. It can make people a little groggy. Don't take that with the hydroxyzine anti itch pill at night wince they can both make you sleepy    3. We will follow up on the lab results today and we will call you if you need to start any additional medicine for your thyroid    4. Follow up with [place] in one month in clinic

## 2018-08-14 NOTE — Unmapped (Signed)
Labs drawn and sent for analysis.  Care provided by  Y Cheek.

## 2018-08-15 NOTE — Unmapped (Signed)
Change in Bexarotene dosage increase. Co-pay $200.00. Medication was scheduled for delivery 07/21. Prescription currently profiled.

## 2018-08-18 MED FILL — BEXAROTENE 75 MG CAPSULE: 30 days supply | Qty: 120 | Fill #0 | Status: AC

## 2018-08-18 NOTE — Unmapped (Signed)
I spoke with Aaron Wilson, and he is aware of bexarotene dose increase to 4 capsules per day. Delivery of medicine on 7/21 is still OK for him.    He told me he'd already starting taking 4/day, and was tolerating well.    Sai Zinn A. Katrinka Blazing, PharmD, BCPS - Pharmacist   [facility]

## 2018-08-21 MED ORDER — LEVOTHYROXINE 50 MCG TABLET
ORAL_TABLET | Freq: Every day | ORAL | 1 refills | 90.00000 days | Status: CP
Start: 2018-08-21 — End: 2019-08-21

## 2018-08-21 NOTE — Unmapped (Signed)
Labs stable or wnl except free T4 low at 0.56, which is typical on bexarotene. Called and notified patient. Start levothyroxine daily.

## 2018-08-25 NOTE — Unmapped (Signed)
IDENTIFICATION: Aaron Wilson is a 73 y.o. male who presents as a referral from Aaron Peers, MD in consultation for recommendations about work up and management of mycosis fungoides.  I have reviewed the outside records and interviewed the patient and my findings are summarized below.        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 PET scan showed diffuse adenopathy with minimal/moderate FDG avidity. CD30 stained scattered cells.  Stage: Stage Iva (T3NxM0B2)  Regimen: 06/2018 - bexarotene 150mg /day and increased to 300mg /day on 08/14/18    He may have already had some benefit from the bexarotene, but certainly not complete clearance. Since he is tolerating it well, we will increase his dose to 300mg /day.    For pruritus, he will try neurontin 300mg  qHS.   WE told him that he might be a little groggy with it.    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  2. Moncoclonal B cell lymphocytosis: AT this time he does not have cytopenias or an elevated WBC or lymphocyte count. I don't think that he meets criteria for CLL. He does have some LAD on his recent PET scan, but given current presentation I think this is most likely due to his MF rather than CLL. At this time there is no urgency to biopsy a node to confirm that it is not from CLL since, even if CLL were present, no treatment would be indicated at this time.    Aaron Critchley, MD  Associate Professor of Medicine  Division of Hematology and Oncology    =========================================    HISTORY OF PRESENT ILLNESS:  Aaron Wilson developed red scaly bumps on his arms over a year ago with gradual spread to his back with associated pruritus interfering with sleep.  On 03/14/18 he had a LUE shave biopsy with folliculocentric MF and postiive T cell gene re-arrangements.  PET scan showed mildly hpermetabolic cervical nodes and axillary nodes that were 1.2-1.6cm in size and a 2.2 cm right common iliac node.    Flow cytometry of peripheral blood showed a monotypic B cell population most c/w monoclona B cell lymphocytosis.  The CD4+/CD7- population represented 45% of lymphocytes with a CD4:CD8 ratio of 11:1.  The aberrant T cell population represented 12.6% (1099 cells/microL) of peripheral WBC.    He saw Aaron Wilson in [facility] in June and was started on bexarotene 150mg /day. He has tolerated treatment well. No nausea or fatigue. He thinks that his rash has flattened a little bit on his BUE. No new rash.  The pruritus is quite minimal during the day, but it keeps him up at night.    No fevers, SOB or cough    PAST MEDICAL HISTORY:  Past Medical History:   Diagnosis Date   ??? Allergic    ??? Arthritis    ??? Cancer (CMS-HCC)    ??? Diabetes mellitus (CMS-HCC)    ??? Hyperlipidemia    ??? Hypertension        MEDICATIONS:  Current Outpatient Medications   Medication Sig Dispense Refill   ??? aspirin (ECOTRIN) 81 MG tablet Take 81 mg by mouth once as needed.     ??? atorvastatin (LIPITOR) 40 MG tablet Take 1 tablet (40 mg total) by mouth daily. 30 tablet 6   ??? betamethasone dipropionate (DIPROLENE) 0.05 %  ointment Apply topically two (2) times a day.     ??? bexarotene (TARGRETIN) 75 mg capsule Take 4 capsules (300 mg total) by mouth daily. With food 120 capsule 1   ??? cetirizine (ZYRTEC) 10 MG tablet Take 1 tablet (10 mg total) by mouth daily. (Patient not taking: Reported on 08/14/2018) 30 tablet 5   ??? ciclopirox (LOPROX) 0.77 % cream Apply topically two (2) times a day as needed.     ??? clobetasoL (TEMOVATE) 0.05 % ointment Apply topically Two (2) times a day. As needed for itching 120 g 5   ??? gabapentin (NEURONTIN) 300 MG capsule Take 1 capsule (300 mg total) by mouth nightly. 30 capsule 11   ??? hydrOXYzine (ATARAX) 25 MG tablet Take 1 tablet (25 mg total) by mouth nightly. 30 tablet 5   ??? levothyroxine (SYNTHROID) 50 MCG tablet Take 1 tablet (50 mcg total) by mouth daily. 90 tablet 1   ??? metFORMIN (GLUCOPHAGE) 500 MG tablet Take 500 mg by mouth two (2) times a day.     ??? omega-3 acid ethyl esters (LOVAZA) 1 gram capsule Take 1 capsule by mouth two (2) times a day.     ??? tamsulosin (FLOMAX) 0.4 mg capsule Take 0.4 mg by mouth nightly.       No current facility-administered medications for this visit.        ALLERGIES:  No Known Allergies    SOCIAL HISTORY:  Social History     Socioeconomic History   ??? Marital status: Single     Spouse name: Not on file   ??? Number of children: Not on file   ??? Years of education: Not on file   ??? Highest education level: Not on file   Occupational History   ??? Not on file   Social Needs   ??? Financial resource strain: Not on file   ??? Food insecurity     Worry: Not on file     Inability: Not on file   ??? Transportation needs     Medical: Not on file     Non-medical: Not on file   Tobacco Use   ??? Smoking status: Former Smoker     Types: Cigarettes     Quit date: 01/14/2013     Years since quitting: 5.6   ??? Smokeless tobacco: Never Used   Substance and Sexual Activity   ??? Alcohol use: Not Currently   ??? Drug use: Never   ??? Sexual activity: Not on file   Lifestyle   ??? Physical activity     Days per week: Not on file     Minutes per session: Not on file   ??? Stress: Not on file   Relationships   ??? Social Wellsite geologist on phone: Not on file     Gets together: Not on file     Attends religious service: Not on file     Active member of club or organization: Not on file     Attends meetings of clubs or organizations: Not on file     Relationship status: Not on file   Other Topics Concern   ??? Do you use sunscreen? No   ??? Tanning bed use? No   ??? Are you easily burned? No   ??? Excessive sun exposure? No   ??? Blistering sunburns? No   Social History Narrative   ??? Not on file       FAMILY HISTORY:  Family History  Problem Relation Age of Onset   ??? Melanoma Neg Hx    ??? Basal cell carcinoma Neg Hx    ??? Squamous cell carcinoma Neg Hx        REVIEW OF SYSTEMS:  See HPI. A 10 system ROS is otherwise negative.    VITAL SIGNS:   There were no vitals filed for this visit.    EXAM:  CONST: NAD, Awake, Alert  HEENT: Oropharynx clear.   LYMPH: No cervical, supraclavicular, axillary, or inguinal LAD  RESP: Clear to auscultation bilaterally.  CV: Regular rate and rhythm. No rubs, gallops or murmurs.   GI: Soft, nontender, nondistended. No hepatosplenomegaly.   MSK: No edema.  NEURO: No focal deficits  Skin: Several erythematous folliculocentric papules, plaques and nodules with overlying scale and crust most predominantly on the bilateral upper extremities (L>R) but also involving the ears and back. No new pictures taken today, but to my eyes it looked very similar to photos from 6/8, which can be seen in the media section.  Maybe, a little flattening on BUE lesions on upper arms.      LABORATORY:  Lab on 08/14/2018   Component Date Value Ref Range Status   ??? Sodium 08/14/2018 139  135 - 145 mmol/L Final   ??? Potassium 08/14/2018 4.2  3.5 - 5.0 mmol/L Final   ??? Chloride 08/14/2018 104  98 - 107 mmol/L Final   ??? Anion Gap 08/14/2018 12  7 - 15 mmol/L Final   ??? CO2 08/14/2018 23.0  22.0 - 30.0 mmol/L Final   ??? BUN 08/14/2018 18  7 - 21 mg/dL Final   ??? Creatinine 08/14/2018 1.00  0.70 - 1.30 mg/dL Final   ??? BUN/Creatinine Ratio 08/14/2018 18   Final   ??? EGFR CKD-EPI Non-African American,* 08/14/2018 75  >=60 mL/min/1.48m2 Final   ??? EGFR CKD-EPI African American, Male 08/14/2018 87  >=60 mL/min/1.7m2 Final   ??? Glucose 08/14/2018 126  70 - 179 mg/dL Final   ??? Calcium 16/10/9602 9.8  8.5 - 10.2 mg/dL Final   ??? Albumin 54/09/8117 4.2  3.5 - 5.0 g/dL Final   ??? Total Protein 08/14/2018 8.3  6.5 - 8.3 g/dL Final   ??? Total Bilirubin 08/14/2018 0.3  0.0 - 1.2 mg/dL Final   ??? AST 14/78/2956 34  19 - 55 U/L Final   ??? ALT 08/14/2018 32  <50 U/L Final   ??? Alkaline Phosphatase 08/14/2018 81  38 - 126 U/L Final   ??? WBC 08/14/2018 7.4  4.5 - 11.0 10*9/L Final   ??? RBC 08/14/2018 4.73  4.50 - 5.90 10*12/L Final   ??? HGB 08/14/2018 13.8  13.5 - 17.5 g/dL Final   ??? HCT 21/30/8657 42.4  41.0 - 53.0 % Final   ??? MCV 08/14/2018 89.6  80.0 - 100.0 fL Final   ??? MCH 08/14/2018 29.1  26.0 - 34.0 pg Final   ??? MCHC 08/14/2018 32.4  31.0 - 37.0 g/dL Final   ??? RDW 84/69/6295 15.5* 12.0 - 15.0 % Final   ??? MPV 08/14/2018 8.4  7.0 - 10.0 fL Final   ??? Platelet 08/14/2018 244  150 - 440 10*9/L Final   ??? Neutrophils % 08/14/2018 33.0  % Final   ??? Lymphocytes % 08/14/2018 26.3  % Final   ??? Monocytes % 08/14/2018 4.7  % Final   ??? Eosinophils % 08/14/2018 32.4  % Final   ??? Basophils % 08/14/2018 1.0  % Final   ??? Absolute Neutrophils 08/14/2018 2.4  2.0 - 7.5 10*9/L Final   ??? Absolute Lymphocytes 08/14/2018 2.0  1.5 - 5.0 10*9/L Final   ??? Absolute Monocytes 08/14/2018 0.4  0.2 - 0.8 10*9/L Final   ??? Absolute Eosinophils 08/14/2018 2.4* 0.0 - 0.4 10*9/L Final   ??? Absolute Basophils 08/14/2018 0.1  0.0 - 0.1 10*9/L Final   ??? Large Unstained Cells 08/14/2018 3  0 - 4 % Final   ??? Hypochromasia 08/14/2018 Slight* Not Present Final   ??? Triglycerides 08/14/2018 279* 1 - 149 mg/dL Final   ??? Free T4 16/10/9602 0.56* 0.71 - 1.40 ng/dL Final   ??? Cholesterol 08/14/2018 194  100 - 199 mg/dL Final

## 2018-08-30 NOTE — Unmapped (Signed)
??  [facility] -- Dermatology Note    ??  Assessment and Plan:     Mycosis Fungoides Stage IIb, T3NxM0B0, with evidence of folliculotropic infiltrates in patch-plaque lesions as well as more infiltrative tumors and nodules most prominent on the bilateral upper extremities. PET scan showed diffuse adenopathy with minimal/moderate FDG avidity.  - Counseled patient on etiology, natural history, course and treatment for the above conditions.  Reassurance and anticipatory guidance were provided  - Patient already noticing some improvement on bexarotene and is tolerating medication well. Will increase dose to 300mg  po daily  - check labs today including free T4, lipids, CBC, CMP  - Continue clobetasoL (TEMOVATE) 0.05 % ointment  - Add gabapentin 300mg  po qhs for pruritus, caution sedation  - ok to continue cetirizine and hydroxyzine for itch but cautioned not to combine gabapentin and hydroxyzine  - Of note, patient did have monoclonal B cell lymphocytosis on flow cytometry but does not meet criteria for CLL at this point. Will continue to monitor  - Discussed assessment and plan with Dr Elmon Kirschner (oncology) and we are in agreement    Return to clinic in 24mo    The treatment plan was discussed with the patient, who voiced understanding, and all questions were answered.    Subjective:     Chief Complaint: New patient,   Chief Complaint   Patient presents with   ??? Routine Follow-up       HPI: This is a 73 y.o. male last seen by me a few weeks ago here for mycosis fungoides. He has been taking bexarotene 150mg  po daily for a couple of weeks and is tolerating well without side effects. He continues to be bothered by itching on back but otherwise feels well. Despite numerous lesions on his arms he reports that these do not bother him much.     Pertinent Past Medical History:   Mycosis Fungoides  Biopsy on 03/14/18:  Final Diagnosis   Date Value Ref Range Status   07/07/2018   Final    Peripheral blood, smear review and flow cytometry  -  Immunophenotypically aberrant T-cell population identified representing 12.6% (1,099 cells/uL) of peripheral white blood cells  -  Low level CLL-type monoclonal B-cell lymphocytosis, representing 0.8% (70 cells/uL) of the peripheral white blood cells by flow cytometric analysis (See Comment)  -  CD4-positive/CD7-negative cells represent 45% of lymphocytes  -  CD4:CD8 ratio = 11.4:1      This electronic signature is attestation that the pathologist personally reviewed the submitted material(s) and the final diagnosis reflects that evaluation.        Special Stains:  By outside report, special stains were performed but not provided for our review; see results below.  Special stains for organisms (PAS/F and Treponema pallidum) are performed and are negative.  ??  Outside stains of block 1A are reviewed.  CD3: CD3 stains the lymphoid infiltrate.  CD20:  CD20 stains scattered cells.  CD4:  CD4 stains the majority of cells in the lymphoid infiltrate.  CD8:  CD8 stains a very small subset of the lymphoid cells.  CD30:  CD30 stains scattered cells.  Langerhans:  Langerhans stains scattered cells.    PET scan:  -Multiple mildly hypermetabolic bilateral cervical lymph nodes involving levels II-V.  - Axillae/subpectoral: Multiple bilateral axillary/subpectoral adenopathy. For example, see 1.6 cm moderately FDG avid right node (CT: 121) and 1.2 cm left axillary node, mildly FDG avid (CT: 119).  - Mediastinum/hila: Scattered subcentimeter mediastinal nodes,  all of which are non-FDG avid. For example: 0.7 cm para-aortic node (CT: 19). Minimal coronary calcifications.  - Adenopathy: Multiple mild to moderately hypermetabolic abdominal and pelvic lymph nodes, for example: 1.1 cm retrocaval (CT: 250), 2.2 cm right common iliac (4:283), bilateral external iliac (CT 335) and bilateral subcentimeter inguinal (CT: 371).   - Soft tissue nodularity of the anterior abdominal wall (CT: 237); may represent Pautrier microabscesses (T-cell aggregates in the epidermis) known to be associated with Mycosis Fungoides.    Diabetes mellitus, hypertension, hyperlipidemia    Family History:   No family history of melanoma     Social History:   Lives in [place], [place]  Works as a custodian     Review of Systems:  No fever, chills. No other skin complaints. Other than the HPI, the balance of 10 system reviewed is negative.    Objective:     Physical Examination:  General: Well-developed, well-nourished male in no acute distress, resting comfortably.  Neuro: A&Ox 3. Answers questions appropriately.  Psych: Mood and affect appropriate for age  Skin: Examination including inspection and palpation of the face, neck, chest, abdomen, back, bilateral upper extremities, palms, and nails was performed and notable for the following:  - Several erythematous folliculocentric papules, plaques and nodules with overlying scale and crust most predominantly on the bilateral upper extremities (L>R) but also involving the ears and back, slightly improved from 06/2018  - All other areas not specifically commented on are within normal limits.  Lymphatics: negative for palpable  Cervical, axillary, inguinal adenopathy

## 2018-09-12 NOTE — Unmapped (Signed)
Aaron Wilson reports doing well on his bexarotene. He denies any adverse effects, and he continues to see improvement in his skin. No issues identified.     [facility] Clinical Assessment & Refill Coordination Note    Aaron Wilson, DOB: [birth year]  Phone: [phone] (home) [phone] (work)    All above HIPAA information was verified with patient.     Specialty Medication(s):   Hematology/Oncology: Targretin     Current Outpatient Medications   Medication Sig Dispense Refill   ??? aspirin (ECOTRIN) 81 MG tablet Take 81 mg by mouth once as needed.     ??? atorvastatin (LIPITOR) 40 MG tablet Take 1 tablet (40 mg total) by mouth daily. 30 tablet 6   ??? betamethasone dipropionate (DIPROLENE) 0.05 % ointment Apply topically two (2) times a day.     ??? bexarotene (TARGRETIN) 75 mg capsule Take 4 capsules (300 mg total) by mouth daily. With food 120 capsule 1   ??? cetirizine (ZYRTEC) 10 MG tablet Take 1 tablet (10 mg total) by mouth daily. (Patient not taking: Reported on 08/14/2018) 30 tablet 5   ??? ciclopirox (LOPROX) 0.77 % cream Apply topically two (2) times a day as needed.     ??? clobetasoL (TEMOVATE) 0.05 % ointment Apply topically Two (2) times a day. As needed for itching 120 g 5   ??? gabapentin (NEURONTIN) 300 MG capsule Take 1 capsule (300 mg total) by mouth nightly. 30 capsule 11   ??? hydrOXYzine (ATARAX) 25 MG tablet Take 1 tablet (25 mg total) by mouth nightly. 30 tablet 5   ??? levothyroxine (SYNTHROID) 50 MCG tablet Take 1 tablet (50 mcg total) by mouth daily. 90 tablet 1   ??? metFORMIN (GLUCOPHAGE) 500 MG tablet Take 500 mg by mouth two (2) times a day.     ??? omega-3 acid ethyl esters (LOVAZA) 1 gram capsule Take 1 capsule by mouth two (2) times a day.     ??? tamsulosin (FLOMAX) 0.4 mg capsule Take 0.4 mg by mouth nightly.       No current facility-administered medications for this visit.         Changes to medications: Aaron Wilson reports no changes at this time.    No Known Allergies    Changes to allergies: No    SPECIALTY MEDICATION ADHERENCE     Bexarotene - half a bottle, estimate 10 days         Specialty medication(s) dose(s) confirmed: Regimen is correct and unchanged.     Are there any concerns with adherence? No    Adherence counseling provided? Not needed    CLINICAL MANAGEMENT AND INTERVENTION      Clinical Benefit Assessment:    Do you feel the medicine is effective or helping your condition? Yes    Clinical Benefit counseling provided? Not needed    Adverse Effects Assessment:    Are you experiencing any side effects? No    Are you experiencing difficulty administering your medicine? No    Quality of Life Assessment:    How many days over the past month did your Aaron Wilson  keep you from your normal activities? For example, brushing your teeth or getting up in the morning. 0    Have you discussed this with your provider? Not needed    Therapy Appropriateness:    Is therapy appropriate? Yes, therapy is appropriate and should be continued    DISEASE/MEDICATION-SPECIFIC INFORMATION      N/A    PATIENT SPECIFIC NEEDS     ?  Does the patient have any physical, cognitive, or cultural barriers? No    ? Is the patient high risk? No     ? Does the patient require a Care Management Plan? No     ? Does the patient require physician intervention or other additional services (i.e. nutrition, smoking cessation, social work)? No      SHIPPING     Specialty Medication(s) to be Shipped:   Hematology/Oncology: Targretin (generic, bexarotene)    Other medication(s) to be shipped: na     Changes to insurance: No    Delivery Scheduled: Yes, Expected medication delivery date: Tues, Aug 18.     Medication will be delivered via UPS to the confirmed prescription address in [facility]    The patient will receive a drug information handout for each medication shipped and additional FDA Medication Guides as required.  Verified that patient has previously received a Conservation officer, historic buildings.    All of the patient's questions and concerns have been addressed.    Aaron Wilson   [facility] Specialty Pharmacist

## 2018-09-17 NOTE — Unmapped (Signed)
Aaron Wilson 's Bexarotene shipment will be delayed as a result of the patient's insurance being terminated.      I have reached out to the patient and was unable to leave a message. We will wait for a call back from the patient to reschedule the delivery.  We have not confirmed the new delivery date.

## 2018-09-17 NOTE — Unmapped (Signed)
pt called with new ins info. approved $100 copay and confirmed 8/21 delivery date for bexarotene

## 2018-09-18 MED FILL — BEXAROTENE 75 MG CAPSULE: 30 days supply | Qty: 120 | Fill #1 | Status: AC

## 2018-09-18 MED FILL — BEXAROTENE 75 MG CAPSULE: ORAL | 30 days supply | Qty: 120 | Fill #1

## 2018-09-18 NOTE — Unmapped (Deleted)
IDENTIFICATION: Aaron Wilson is a 73 y.o. male who presents as a referral from [facility] * in consultation for recommendations about work up and management of mycosis fungoides.  I have reviewed the outside records and interviewed the patient and my findings are summarized below.        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 PET scan showed diffuse adenopathy with minimal/moderate FDG avidity. CD30 stained scattered cells.  Stage: Stage Iva (T3NxM0B2)  Regimen: 06/2018 - bexarotene 150mg /day and increased to 300mg /day on 08/14/18    He may have already had some benefit from the bexarotene, but certainly not complete clearance. Since he is tolerating it well, we will increase his dose to 300mg /day.    For pruritus, he will try neurontin 300mg  qHS.   WE told him that he might be a little groggy with it.    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  2. Moncoclonal B cell lymphocytosis: AT this time he does not have cytopenias or an elevated WBC or lymphocyte count. I don't think that he meets criteria for CLL. He does have some LAD on his recent PET scan, but given current presentation I think this is most likely due to his MF rather than CLL. At this time there is no urgency to biopsy a node to confirm that it is not from CLL since, even if CLL were present, no treatment would be indicated at this time.    Aaron Critchley, MD  Associate Professor of Medicine  Division of Hematology and Oncology    =========================================    HISTORY OF PRESENT ILLNESS:  Aaron Wilson developed red scaly bumps on his arms over a year ago with gradual spread to his back with associated pruritus interfering with sleep.  On 03/14/18 he had a LUE shave biopsy with folliculocentric MF and postiive T cell gene re-arrangements.  PET scan showed mildly hpermetabolic cervical nodes and axillary nodes that were 1.2-1.6cm in size and a 2.2 cm right common iliac node.    Flow cytometry of peripheral blood showed a monotypic B cell population most c/w monoclona B cell lymphocytosis.  The CD4+/CD7- population represented 45% of lymphocytes with a CD4:CD8 ratio of 11:1.  The aberrant T cell population represented 12.6% (1099 cells/microL) of peripheral WBC.    He saw Dr Odis Luster [place] in [place] and was started on bexarotene 150mg /day. He has tolerated treatment well. No nausea or fatigue. He thinks that his rash has flattened a little bit on his BUE. No new rash.  The pruritus is quite minimal during the day, but it keeps him up at night.    No fevers, SOB or cough    PAST MEDICAL HISTORY:  Past Medical History:   Diagnosis Date   ??? Allergic    ??? Arthritis    ??? Cancer (CMS-HCC)    ??? Diabetes mellitus (CMS-HCC)    ??? Hyperlipidemia    ??? Hypertension        MEDICATIONS:  Current Outpatient Medications   Medication Sig Dispense Refill   ??? aspirin (ECOTRIN) 81 MG tablet Take 81 mg by mouth once as needed.     ??? atorvastatin (LIPITOR) 40 MG tablet Take 1 tablet (40 mg total) by mouth daily. 30 tablet 6   ??? betamethasone dipropionate (DIPROLENE) 0.05 %  ointment Apply topically two (2) times a day.     ??? bexarotene (TARGRETIN) 75 mg capsule Take 4 capsules (300 mg total) by mouth daily. With food 120 capsule 1   ??? cetirizine (ZYRTEC) 10 MG tablet Take 1 tablet (10 mg total) by mouth daily. (Patient not taking: Reported on 08/14/2018) 30 tablet 5   ??? ciclopirox (LOPROX) 0.77 % cream Apply topically two (2) times a day as needed.     ??? clobetasoL (TEMOVATE) 0.05 % ointment Apply topically Two (2) times a day. As needed for itching 120 g 5   ??? gabapentin (NEURONTIN) 300 MG capsule Take 1 capsule (300 mg total) by mouth nightly. 30 capsule 11   ??? hydrOXYzine (ATARAX) 25 MG tablet Take 1 tablet (25 mg total) by mouth nightly. 30 tablet 5   ??? levothyroxine (SYNTHROID) 50 MCG tablet Take 1 tablet (50 mcg total) by mouth daily. 90 tablet 1   ??? metFORMIN (GLUCOPHAGE) 500 MG tablet Take 500 mg by mouth two (2) times a day.     ??? omega-3 acid ethyl esters (LOVAZA) 1 gram capsule Take 1 capsule by mouth two (2) times a day.     ??? tamsulosin (FLOMAX) 0.4 mg capsule Take 0.4 mg by mouth nightly.       No current facility-administered medications for this visit.        ALLERGIES:  No Known Allergies    SOCIAL HISTORY:  Social History     Socioeconomic History   ??? Marital status: Single     Spouse name: Not on file   ??? Number of children: Not on file   ??? Years of education: Not on file   ??? Highest education level: Not on file   Occupational History   ??? Not on file   Social Needs   ??? Financial resource strain: Not on file   ??? Food insecurity     Worry: Not on file     Inability: Not on file   ??? Transportation needs     Medical: Not on file     Non-medical: Not on file   Tobacco Use   ??? Smoking status: Former Smoker     Types: Cigarettes     Quit date: 01/14/2013     Years since quitting: 5.6   ??? Smokeless tobacco: Never Used   Substance and Sexual Activity   ??? Alcohol use: Not Currently   ??? Drug use: Never   ??? Sexual activity: Not on file   Lifestyle   ??? Physical activity     Days per week: Not on file     Minutes per session: Not on file   ??? Stress: Not on file   Relationships   ??? Social Wellsite geologist on phone: Not on file     Gets together: Not on file     Attends religious service: Not on file     Active member of club or organization: Not on file     Attends meetings of clubs or organizations: Not on file     Relationship status: Not on file   Other Topics Concern   ??? Do you use sunscreen? No   ??? Tanning bed use? No   ??? Are you easily burned? No   ??? Excessive sun exposure? No   ??? Blistering sunburns? No   Social History Narrative   ??? Not on file       FAMILY HISTORY:  Family History  Problem Relation Age of Onset   ??? Melanoma Neg Hx    ??? Basal cell carcinoma Neg Hx    ??? Squamous cell carcinoma Neg Hx        REVIEW OF SYSTEMS:  See HPI. A 10 system ROS is otherwise negative.    VITAL SIGNS:   There were no vitals filed for this visit.    EXAM:  CONST: NAD, Awake, Alert  HEENT: Oropharynx clear.   LYMPH: No cervical, supraclavicular, axillary, or inguinal LAD  RESP: Clear to auscultation bilaterally.  CV: Regular rate and rhythm. No rubs, gallops or murmurs.   GI: Soft, nontender, nondistended. No hepatosplenomegaly.   MSK: No edema.  NEURO: No focal deficits  Skin: Several erythematous folliculocentric papules, plaques and nodules with overlying scale and crust most predominantly on the bilateral upper extremities (L>R) but also involving the ears and back. No new pictures taken today, but to my eyes it looked very similar to photos from 6/8, which can be seen in the media section.  Maybe, a little flattening on BUE lesions on upper arms.      LABORATORY:  No visits with results within 1 Day(s) from this visit.   Latest known visit with results is:   Lab on 08/14/2018   Component Date Value Ref Range Status   ??? Sodium 08/14/2018 139  135 - 145 mmol/L Final   ??? Potassium 08/14/2018 4.2  3.5 - 5.0 mmol/L Final   ??? Chloride 08/14/2018 104  98 - 107 mmol/L Final   ??? Anion Gap 08/14/2018 12  7 - 15 mmol/L Final   ??? CO2 08/14/2018 23.0  22.0 - 30.0 mmol/L Final   ??? BUN 08/14/2018 18  7 - 21 mg/dL Final   ??? Creatinine 08/14/2018 1.00  0.70 - 1.30 mg/dL Final   ??? BUN/Creatinine Ratio 08/14/2018 18   Final   ??? EGFR CKD-EPI Non-African American,* 08/14/2018 75  >=60 mL/min/1.75m2 Final   ??? EGFR CKD-EPI African American, Male 08/14/2018 87  >=60 mL/min/1.10m2 Final   ??? Glucose 08/14/2018 126  70 - 179 mg/dL Final   ??? Calcium 81/19/1478 9.8  8.5 - 10.2 mg/dL Final   ??? Albumin 29/56/2130 4.2  3.5 - 5.0 g/dL Final   ??? Total Protein 08/14/2018 8.3  6.5 - 8.3 g/dL Final   ??? Total Bilirubin 08/14/2018 0.3  0.0 - 1.2 mg/dL Final   ??? AST 86/57/8469 34  19 - 55 U/L Final   ??? ALT 08/14/2018 32  <50 U/L Final   ??? Alkaline Phosphatase 08/14/2018 81  38 - 126 U/L Final   ??? WBC 08/14/2018 7.4  4.5 - 11.0 10*9/L Final   ??? RBC 08/14/2018 4.73  4.50 - 5.90 10*12/L Final   ??? HGB 08/14/2018 13.8  13.5 - 17.5 g/dL Final   ??? HCT 62/95/2841 42.4  41.0 - 53.0 % Final   ??? MCV 08/14/2018 89.6  80.0 - 100.0 fL Final   ??? MCH 08/14/2018 29.1  26.0 - 34.0 pg Final   ??? MCHC 08/14/2018 32.4  31.0 - 37.0 g/dL Final   ??? RDW 32/44/0102 15.5* 12.0 - 15.0 % Final   ??? MPV 08/14/2018 8.4  7.0 - 10.0 fL Final   ??? Platelet 08/14/2018 244  150 - 440 10*9/L Final   ??? Neutrophils % 08/14/2018 33.0  % Final   ??? Lymphocytes % 08/14/2018 26.3  % Final   ??? Monocytes % 08/14/2018 4.7  % Final   ??? Eosinophils % 08/14/2018 32.4  %  Final   ??? Basophils % 08/14/2018 1.0  % Final   ??? Absolute Neutrophils 08/14/2018 2.4  2.0 - 7.5 10*9/L Final   ??? Absolute Lymphocytes 08/14/2018 2.0  1.5 - 5.0 10*9/L Final   ??? Absolute Monocytes 08/14/2018 0.4  0.2 - 0.8 10*9/L Final   ??? Absolute Eosinophils 08/14/2018 2.4* 0.0 - 0.4 10*9/L Final   ??? Absolute Basophils 08/14/2018 0.1  0.0 - 0.1 10*9/L Final   ??? Large Unstained Cells 08/14/2018 3  0 - 4 % Final   ??? Hypochromasia 08/14/2018 Slight* Not Present Final   ??? Triglycerides 08/14/2018 279* 1 - 149 mg/dL Final   ??? Free T4 47/82/9562 0.56* 0.71 - 1.40 ng/dL Final   ??? Cholesterol 08/14/2018 194  100 - 199 mg/dL Final

## 2018-09-24 NOTE — Unmapped (Signed)
Hi Aaron Wilson contacted the Communication Center requesting to speak with the care team of Aaron Wilson to discuss:    Patient said he missed a text message from [facility]. I might be a reminder about the 8/20 appointments he missed. Do those get scheduled like normal appointments? I wasn't sure since the type is for T-cell therapy.    Thank you,   Kelli Hope  [facility]   [phone]

## 2018-09-25 NOTE — Unmapped (Signed)
Hi,     Patient contacted the Communication Center requesting to speak with the care team of Aaron Wilson to discuss:    Would like to reschedule all appointments from 09/18/18. I was not aware of those appointments.    Please contact patient at [phone].    Check Indicates criteria has been reviewed and confirmed with the patient:    [x]  Preferred Name   [x]  DOB and/or MR#  [x]  Preferred Contact Method  [x]  Phone Number(s)   []  MyChart     Thank you,   Laverna Peace  [facility]   [phone]

## 2018-09-26 NOTE — Unmapped (Signed)
Hi,     Cresencio contacted the [employer] regarding the following:    - Following up on getting his 8/20 appointments rescheduled.    Please contact at [phone].    Thanks in advance,    Vernie Ammons  [facility]   [phone]

## 2018-09-26 NOTE — Unmapped (Signed)
I spoke with patient Aaron Wilson to confirm appointments on the following date(s): appts scheduled for 9/17    Samella Parr

## 2018-10-08 DIAGNOSIS — C8400 Mycosis fungoides, unspecified site: Secondary | ICD-10-CM

## 2018-10-08 MED ORDER — BEXAROTENE 75 MG CAPSULE
ORAL_CAPSULE | Freq: Every day | ORAL | 0 refills | 30 days | Status: CP
Start: 2018-10-08 — End: 2018-10-16
  Filled 2018-10-13: qty 120, 30d supply, fill #0

## 2018-10-08 NOTE — Unmapped (Signed)
[facility] Refill Coordination Note    Specialty Medication(s) to be Shipped:   Hematology/Oncology: Targretin    Other medication(s) to be shipped: Aaron Wilson, DOB: 14-Feb-1945  Phone: [phone] (work)      All above HIPAA information was verified with patient.     Completed refill call assessment today to schedule patient's medication shipment from the [facility] [phone]).       Specialty medication(s) and dose(s) confirmed: Regimen is correct and unchanged.   Changes to medications: [name] reports no changes at this time.  Changes to insurance: No  Questions for the pharmacist: No    Confirmed patient received Welcome Packet with first shipment. The patient will receive a drug information handout for each medication shipped and additional FDA Medication Guides as required.       DISEASE/MEDICATION-SPECIFIC INFORMATION        N/A    SPECIALTY MEDICATION ADHERENCE     Medication Adherence    Patient reported X missed doses in the last month: 0  Specialty Medication: Bexarotene 75 mg  Patient is on additional specialty medications: No  Patient is on more than two specialty medications: No  Any gaps in refill history greater than 2 weeks in the last 3 months: no  Demonstrates understanding of importance of adherence: yes  Informant: patient  Reliability of informant: reliable  Confirmed plan for next specialty medication refill: delivery by pharmacy  Refills needed for supportive medications: not needed                Bexarotene 75 mg. 7 days on hand      SHIPPING     Shipping address confirmed in Epic.     Delivery Scheduled: Yes, Expected medication delivery date: 091520.  However, Rx request for refills was sent to the provider as there are none remaining.     Medication will be delivered via UPS to the prescription address in Epic WAM.    Aaron Wilson   [facility] Specialty Technician

## 2018-10-13 MED ORDER — ATORVASTATIN 40 MG TABLET
ORAL_TABLET | 0 refills | 0 days | Status: CP
Start: 2018-10-13 — End: ?

## 2018-10-13 MED FILL — BEXAROTENE 75 MG CAPSULE: 30 days supply | Qty: 120 | Fill #0 | Status: AC

## 2018-10-16 ENCOUNTER — Ambulatory Visit: Admit: 2018-10-16 | Discharge: 2018-10-16 | Payer: MEDICARE | Attending: Dermatology | Primary: Dermatology

## 2018-10-16 ENCOUNTER — Encounter: Admit: 2018-10-16 | Discharge: 2018-10-16 | Payer: MEDICARE

## 2018-10-16 DIAGNOSIS — C8400 Mycosis fungoides, unspecified site: Secondary | ICD-10-CM

## 2018-10-16 DIAGNOSIS — Z79899 Other long term (current) drug therapy: Secondary | ICD-10-CM

## 2018-10-16 DIAGNOSIS — E785 Hyperlipidemia, unspecified: Secondary | ICD-10-CM

## 2018-10-16 DIAGNOSIS — E032 Hypothyroidism due to medicaments and other exogenous substances: Secondary | ICD-10-CM

## 2018-10-16 LAB — CBC W/ AUTO DIFF
BASOPHILS ABSOLUTE COUNT: 0.1 10*9/L (ref 0.0–0.1)
BASOPHILS RELATIVE PERCENT: 1 %
EOSINOPHILS ABSOLUTE COUNT: 2.6 10*9/L — ABNORMAL HIGH (ref 0.0–0.4)
EOSINOPHILS RELATIVE PERCENT: 33.4 %
HEMOGLOBIN: 13.2 g/dL — ABNORMAL LOW (ref 13.5–17.5)
LARGE UNSTAINED CELLS: 3 % (ref 0–4)
LYMPHOCYTES ABSOLUTE COUNT: 2.1 10*9/L (ref 1.5–5.0)
LYMPHOCYTES RELATIVE PERCENT: 26.7 %
MEAN CORPUSCULAR HEMOGLOBIN CONC: 31.4 g/dL (ref 31.0–37.0)
MEAN CORPUSCULAR HEMOGLOBIN: 28 pg (ref 26.0–34.0)
MEAN CORPUSCULAR VOLUME: 89.1 fL (ref 80.0–100.0)
MEAN PLATELET VOLUME: 8.4 fL (ref 7.0–10.0)
MONOCYTES ABSOLUTE COUNT: 0.4 10*9/L (ref 0.2–0.8)
MONOCYTES RELATIVE PERCENT: 4.9 %
NEUTROPHILS ABSOLUTE COUNT: 2.4 10*9/L (ref 2.0–7.5)
NEUTROPHILS RELATIVE PERCENT: 31.1 %
PLATELET COUNT: 235 10*9/L (ref 150–440)
RED BLOOD CELL COUNT: 4.73 10*12/L (ref 4.50–5.90)
RED CELL DISTRIBUTION WIDTH: 15.1 % — ABNORMAL HIGH (ref 12.0–15.0)
WBC ADJUSTED: 7.8 10*9/L (ref 4.5–11.0)

## 2018-10-16 LAB — CHOLESTEROL: Cholesterol:MCnc:Pt:Ser/Plas:Qn:: 200 — ABNORMAL HIGH

## 2018-10-16 LAB — COMPREHENSIVE METABOLIC PANEL
ALBUMIN: 4.1 g/dL (ref 3.5–5.0)
ALKALINE PHOSPHATASE: 80 U/L (ref 38–126)
ALT (SGPT): 25 U/L (ref <50)
ANION GAP: 8 mmol/L (ref 7–15)
AST (SGOT): 32 U/L (ref 19–55)
BILIRUBIN TOTAL: 0.4 mg/dL (ref 0.0–1.2)
BLOOD UREA NITROGEN: 16 mg/dL (ref 7–21)
BUN / CREAT RATIO: 21
CALCIUM: 9.7 mg/dL (ref 8.5–10.2)
CO2: 25 mmol/L (ref 22.0–30.0)
CREATININE: 0.78 mg/dL (ref 0.70–1.30)
EGFR CKD-EPI AA MALE: >90 mL/min/{1.73_m2} (ref >=60)
EGFR CKD-EPI NON-AA MALE: >90 mL/min/{1.73_m2} (ref >=60)
POTASSIUM: 4.3 mmol/L (ref 3.5–5.0)
PROTEIN TOTAL: 7.7 g/dL (ref 6.5–8.3)
SODIUM: 140 mmol/L (ref 135–145)

## 2018-10-16 LAB — FREE T4: Thyroxine.free:MCnc:Pt:Ser/Plas:Qn:: 0.45 — ABNORMAL LOW

## 2018-10-16 LAB — ANION GAP: Anion gap 3:SCnc:Pt:Ser/Plas:Qn:: 8

## 2018-10-16 LAB — MEAN CORPUSCULAR HEMOGLOBIN CONC: Lab: 31.4

## 2018-10-16 LAB — TRIGLYCERIDES: Triglyceride:MCnc:Pt:Ser/Plas:Qn:: 441 — ABNORMAL HIGH

## 2018-10-16 MED ORDER — BEXAROTENE 75 MG CAPSULE
ORAL_CAPSULE | Freq: Every day | ORAL | 2 refills | 30.00000 days | Status: CP
Start: 2018-10-16 — End: ?
  Filled 2018-11-03: qty 180, 30d supply, fill #0

## 2018-10-16 NOTE — Unmapped (Signed)
Multidisciplinary [facility] -- Dermatology Note    ??  Assessment and Plan:     Diagnoses and all orders for this visit:    High risk medication use  -     T4, free  -     Cholesterol, Total  -     Triglycerides    Mycosis fungoides, unspecified body region (CMS-HCC)  -     bexarotene (TARGRETIN) 75 mg capsule; Take 6 capsules (450 mg total) by mouth daily.      Mycosis Fungoides/Sezary, T3NxM0B2, with evidence of folliculotropic infiltrates in patch-plaque lesions as well as more infiltrative tumors and nodules most prominent on the bilateral upper extremities. PET scan showed diffuse adenopathy with minimal/moderate FDG avidity.  - Counseled patient on etiology, natural history, course and treatment for the above conditions.  Reassurance and anticipatory guidance were provided  - Patientis noticing some improvement on bexarotene, although it is subtle, and is tolerating medication well. Will increase dose to 450mg  po daily  - check labs today including free T4, lipids, CBC, CMP  - Continue clobetasoL (TEMOVATE) 0.05 % ointment  - continue gabapentin 300mg  po qhs for pruritus, caution sedation  - ok to continue cetirizine and hydroxyzine for itch but cautioned not to combine gabapentin and hydroxyzine  - Of note, patient did have monoclonal B cell lymphocytosis on flow cytometry but does not meet criteria for CLL at this point. Will continue to monitor  - Discussed assessment and plan with Dr Elmon Kirschner (oncology) and we are in agreement    Bilateral upper extremity weakness, L>R, and bilateral finger numbness  - This is quite notable on physical exam and apparently new onset for patient. Unlikely to be related to his MF or therapy. Advised workup with PCP, patient will call to schedule appointment    Return to clinic in 56mo    The treatment plan was discussed with the patient, who voiced understanding, and all questions were answered.    Subjective:     Chief Complaint: f/u MF    HPI: This is a 73 y.o. male last seen 2 months ago, here for f/u MF. He is currently taking bexarotene 300mg  po daily for a couple of weeks and is tolerating well without side effects. He continues to be bothered by itching on back, although he thinks this is somewhat better and he feels that he is getting fewer new lesions on arms. He otherwise feels well. Despite numerous lesions on his arms he reports that these do not bother him much.     Patient does report worsened bilateral upper extremity weakness, L>R, which is new for him in the past few months. His fingertips are also numb bilaterally. It is so severe that he sometimes has to use his right arm to help raise is left one. He has not yet seen his PCP for evaluation of this.     Pertinent Past Medical History:   Mycosis Fungoides  Biopsy on 03/14/18:  Final Diagnosis   Date Value Ref Range Status   07/07/2018   Final    Peripheral blood, smear review and flow cytometry  -  Immunophenotypically aberrant T-cell population identified representing 12.6% (1,099 cells/uL) of peripheral white blood cells  -  Low level CLL-type monoclonal B-cell lymphocytosis, representing 0.8% (70 cells/uL) of the peripheral white blood cells by flow cytometric analysis (See Comment)  -  CD4-positive/CD7-negative cells represent 45% of lymphocytes  -  CD4:CD8 ratio = 11.4:1      This electronic signature  is attestation that the pathologist personally reviewed the submitted material(s) and the final diagnosis reflects that evaluation.        Special Stains:  By outside report, special stains were performed but not provided for our review; see results below.  Special stains for organisms (PAS/F and Treponema pallidum) are performed and are negative.  ??  Outside stains of block 1A are reviewed.  CD3: CD3 stains the lymphoid infiltrate.  CD20:  CD20 stains scattered cells.  CD4:  CD4 stains the majority of cells in the lymphoid infiltrate.  CD8:  CD8 stains a very small subset of the lymphoid cells.  CD30: CD30 stains scattered cells.  Langerhans:  Langerhans stains scattered cells.    PET scan:  -Multiple mildly hypermetabolic bilateral cervical lymph nodes involving levels II-V.  - Axillae/subpectoral: Multiple bilateral axillary/subpectoral adenopathy. For example, see 1.6 cm moderately FDG avid right node (CT: 121) and 1.2 cm left axillary node, mildly FDG avid (CT: 119).  - Mediastinum/hila: Scattered subcentimeter mediastinal nodes, all of which are non-FDG avid. For example: 0.7 cm para-aortic node (CT: 19). Minimal coronary calcifications.  - Adenopathy: Multiple mild to moderately hypermetabolic abdominal and pelvic lymph nodes, for example: 1.1 cm retrocaval (CT: 250), 2.2 cm right common iliac (4:283), bilateral external iliac (CT 335) and bilateral subcentimeter inguinal (CT: 371).   - Soft tissue nodularity of the anterior abdominal wall (CT: 237); may represent Pautrier microabscesses (T-cell aggregates in the epidermis) known to be associated with Mycosis Fungoides.    Diabetes mellitus, hypertension, hyperlipidemia    Family History:   No family history of melanoma     Social History:   Lives in [place], [place]  Works as a custodian     Review of Systems:  No fever, chills. No other skin complaints. Other than the HPI, the balance of 10 system reviewed is negative.    Objective:     Physical Examination:  General: Well-developed, well-nourished male in no acute distress, resting comfortably.  Neuro: A&Ox 3. Answers questions appropriately.  Psych: Mood and affect appropriate for age  Skin: Examination including inspection and palpation of the face, neck, chest, abdomen, back, bilateral upper extremities, palms, and nails was performed and notable for the following:  - Several erythematous folliculocentric papules, plaques and nodules with overlying scale and crust most predominantly on the bilateral upper extremities (L>R) but also involving the ears and back, slightly improved from 07/2018  - All other areas not specifically commented on are within normal limits.  Lymphatics: negative for palpable  Cervical, axillary, inguinal adenopathy  Neuro: decreased bilateral upper extremity strength, L>R, patient struggles to lift/push/pull

## 2018-10-16 NOTE — Unmapped (Signed)
Please call your primary care doctor and ask for an appointment to evaluate the weakness in your arms. Please let us know if you are not able to get an appointment.

## 2018-10-16 NOTE — Unmapped (Signed)
Labs drawn by  RA and sent for analysis.

## 2018-10-16 NOTE — Unmapped (Signed)
IDENTIFICATION: Aaron Wilson is a 73 y.o. male who presents as a referral from [facility] * in consultation for recommendations about work up and management of mycosis fungoides.  I have reviewed the outside records and interviewed the patient and my findings are summarized below.      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 PET scan showed diffuse adenopathy with minimal/moderate FDG avidity. CD30 stained scattered cells.  Stage: Stage Iva ([id number])  Regimen: 06/2018 - bexarotene 150mg /day and increased to 300mg /day on 08/14/18 and to 450mg  on 10/16/18    He may have already had some benefit from the bexarotene, but certainly not complete clearance. Since he is tolerating it well, we will increase his dose to 450mg /day.  IF he doesn't start to get more of an improvement we may consider referral for TSI.     For pruritus, he will continue neurontin 300mg  qhs.    OTHER ISSUES:   1. Bilateral upper extremity weakness and numbness: Patient notes new complaint of progressive weakness and numbness in his hands for the last 1-2 months, with objective weakness to confrontation strength testing in bilateral arms. He has a normal gait and no lower extremity symptoms to suggest neuropathy in these areas. Involvement of both arms raises concern for bilateral radiculopathy (though this seems less likely in the setting of subacute concurrent bilateral symptoms) or more central compressive process. Symptoms are unlikely to relate to his CTCL or therapy. He warrants C (and likely T) spine imaging, and we advised that he follow up closely with his PCP for further evaluation. We otherwise advised him to call our office to coordinate imaging if he is not able to get in with his PCP in the next few days.    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 He does have some LAD on his recent PET scan, but given current presentation I think this is most likely due to his MF rather than CLL. At this time there is no urgency to biopsy a node to confirm that it is not from CLL since, even if CLL were present, no treatment would be indicated at this time.    4. Hypertriglyceridemia: His Tg have been slowly increaseing since we started bexarotene.  He is now at 441. He is baseline on atorvastatin. We will leave at current dose, but consider further intervention at next visit if they continue to elevate.  TG can increase risk of pancreatitis, typically if they get into the 800 or higher range.    Return to CTCL clinic in 2 months or sooner as needed.    Patient seen and discussed with Dr. Elmon Kirschner.  Patient seen in parallel with dermatology.  Laurian Brim, MD  Hematology and Oncology Fellow     I saw and evaluated the patient, participating in the key portions of the service.?? I reviewed the resident???s note.?? I agree with the resident???s findings and plan. Gae Bon, MD          =========================================    HISTORY OF PRESENT ILLNESS:  Mr. Aaron Wilson developed red scaly bumps on his arms over a year ago with gradual spread to his back with  associated pruritus interfering with sleep.  On 03/14/18 he had a LUE shave biopsy with folliculocentric MF and postiive T cell gene re-arrangements.  PET scan showed mildly hpermetabolic cervical nodes and axillary nodes that were 1.2-1.6cm in size and a 2.2 cm right common iliac node.    Flow cytometry of peripheral blood showed a monotypic B cell population most c/w monoclonal B cell lymphocytosis.  The CD4+/CD7- population represented 45% of lymphocytes with a CD4:CD8 ratio of 11:1.  The aberrant T cell population represented 12.6% (1099 cells/microL) of peripheral WBC.    He saw Dr Odis Luster in [facility] in June and was started on bexarotene 150mg /day, increased to 300mg /day in July.    He is seen today in follow up.  He reports that he feels his skin has cleared somewhat since his last evaluation, particularly on the left arm.  He has noted new numbness in his fingers and weakness in his bilateral arms, though he is not sure exactly how long this has been present. He says that he cannot bend at his elbows on the left side. He denies neck or back pain but reports some pain in the left shoulder.  No weakness or numbness in his legs.  He denies fevers, chills, nausea, vomiting and other new symptomatic complaints.  He states that his itching is minimal.      PAST MEDICAL HISTORY:  Past Medical History:   Diagnosis Date   ??? Allergic    ??? Arthritis    ??? Cancer (CMS-HCC)    ??? Diabetes mellitus (CMS-HCC)    ??? Hyperlipidemia    ??? Hypertension        MEDICATIONS:  Current Outpatient Medications   Medication Sig Dispense Refill   ??? aspirin (ECOTRIN) 81 MG tablet Take 81 mg by mouth once as needed.     ??? atorvastatin (LIPITOR) 40 MG tablet TAKE 1 TABLET BY MOUTH ONCE DAILY FOR CHOLESTEROL 30 tablet 0   ??? cetirizine (ZYRTEC) 10 MG tablet Take 1 tablet (10 mg total) by mouth daily. 30 tablet 5   ??? gabapentin (NEURONTIN) 300 MG capsule Take 1 capsule (300 mg total) by mouth nightly. 30 capsule 11   ??? levothyroxine (SYNTHROID) 50 MCG tablet Take 1 tablet (50 mcg total) by mouth daily. 90 tablet 1   ??? metFORMIN (GLUCOPHAGE) 500 MG tablet Take 500 mg by mouth two (2) times a day.     ??? omega-3 acid ethyl esters (LOVAZA) 1 gram capsule Take 1 capsule by mouth two (2) times a day.     ??? tamsulosin (FLOMAX) 0.4 mg capsule Take 0.4 mg by mouth nightly.     ??? betamethasone dipropionate (DIPROLENE) 0.05 % ointment Apply topically two (2) times a day.     ??? bexarotene (TARGRETIN) 75 mg capsule Take 6 capsules (450 mg total) by mouth daily. 180 capsule 2 ??? ciclopirox (LOPROX) 0.77 % cream Apply topically two (2) times a day as needed.     ??? clobetasoL (TEMOVATE) 0.05 % ointment Apply topically Two (2) times a day. As needed for itching (Patient not taking: Reported on 10/16/2018) 120 g 5   ??? hydrOXYzine (ATARAX) 25 MG tablet Take 1 tablet (25 mg total) by mouth nightly. (Patient not taking: Reported on 10/16/2018) 30 tablet 5     No current facility-administered medications for this visit.        ALLERGIES:  No Known Allergies    SOCIAL HISTORY:  Social History     Socioeconomic History   ???  Marital status: Single     Spouse name: Not on file   ??? Number of children: Not on file   ??? Years of education: Not on file   ??? Highest education level: Not on file   Occupational History   ??? Not on file   Social Needs   ??? Financial resource strain: Not on file   ??? Food insecurity     Worry: Not on file     Inability: Not on file   ??? Transportation needs     Medical: Not on file     Non-medical: Not on file   Tobacco Use   ??? Smoking status: Former Smoker     Types: Cigarettes     Quit date: 01/14/2013     Years since quitting: 5.7   ??? Smokeless tobacco: Never Used   Substance and Sexual Activity   ??? Alcohol use: Not Currently   ??? Drug use: Never   ??? Sexual activity: Not on file   Lifestyle   ??? Physical activity     Days per week: Not on file     Minutes per session: Not on file   ??? Stress: Not on file   Relationships   ??? Social Wellsite geologist on phone: Not on file     Gets together: Not on file     Attends religious service: Not on file     Active member of club or organization: Not on file     Attends meetings of clubs or organizations: Not on file     Relationship status: Not on file   Other Topics Concern   ??? Do you use sunscreen? No   ??? Tanning bed use? No   ??? Are you easily burned? No   ??? Excessive sun exposure? No   ??? Blistering sunburns? No   Social History Narrative   ??? Not on file       FAMILY HISTORY:  Family History   Problem Relation Age of Onset   ??? Melanoma Neg Hx    ??? Basal cell carcinoma Neg Hx    ??? Squamous cell carcinoma Neg Hx        REVIEW OF SYSTEMS:  See HPI. A 10 system ROS is otherwise negative.    VITAL SIGNS:   Vitals:    10/16/18 1336   BP: 139/72   Pulse: 64   Resp: 18   Temp: 36.4 ??C (97.6 ??F)   TempSrc: Temporal   SpO2: 99%   Weight: 92.3 kg (203 lb 8 oz)       EXAM:  CONST: NAD, Awake, Alert  HEENT: Oropharynx clear.   LYMPH: No cervical, supraclavicular, axillary, or inguinal LAD  RESP: Clear to auscultation bilaterally.  CV: Regular rate and rhythm. No rubs, gallops or murmurs.   GI: Soft, nontender, nondistended. No hepatosplenomegaly.   MSK: No edema.  Skin: Several erythematous folliculocentric papules, plaques and nodules with overlying scale and crust most predominantly on the bilateral upper extremities (L>R) but also involving the ears and back, with some clearing of the left arm compared to last visit.  NEURO: No focal deficits  Neuro: A&Ox 3. Answers questions appropriately.  Subjective numbness to light touch in bilateral fingers.  3-/5 left and 4-/5 right elbow flexion weakness   4/5 elbow extension weakness  3/5 bilateral shoulder abduction  Preserved pincer grasp strength  Preserved lower extremity strength to confrontation testing      LABORATORY:  Office Visit on  10/16/2018   Component Date Value Ref Range Status   ??? Free T4 10/16/2018 0.45* 0.71 - 1.40 ng/dL Final   ??? Cholesterol 10/16/2018 200* 100 - 199 mg/dL Final   ??? Triglycerides 10/16/2018 441* 1 - 149 mg/dL Final   Lab on 16/10/9602   Component Date Value Ref Range Status   ??? Sodium 10/16/2018 140  135 - 145 mmol/L Final   ??? Potassium 10/16/2018 4.3  3.5 - 5.0 mmol/L Final   ??? Chloride 10/16/2018 107  98 - 107 mmol/L Final   ??? Anion Gap 10/16/2018 8  7 - 15 mmol/L Final   ??? CO2 10/16/2018 25.0  22.0 - 30.0 mmol/L Final   ??? BUN 10/16/2018 16  7 - 21 mg/dL Final   ??? Creatinine 10/16/2018 0.78  0.70 - 1.30 mg/dL Final   ??? BUN/Creatinine Ratio 10/16/2018 21   Final   ??? EGFR CKD-EPI Non-African American,* 10/16/2018 >90  >=60 mL/min/1.50m2 Final   ??? EGFR CKD-EPI African American, Male 10/16/2018 >90  >=60 mL/min/1.39m2 Final   ??? Glucose 10/16/2018 120  70 - 179 mg/dL Final   ??? Calcium 54/09/8117 9.7  8.5 - 10.2 mg/dL Final   ??? Albumin 14/78/2956 4.1  3.5 - 5.0 g/dL Final   ??? Total Protein 10/16/2018 7.7  6.5 - 8.3 g/dL Final   ??? Total Bilirubin 10/16/2018 0.4  0.0 - 1.2 mg/dL Final   ??? AST 21/30/8657 32  19 - 55 U/L Final   ??? ALT 10/16/2018 25  <50 U/L Final   ??? Alkaline Phosphatase 10/16/2018 80  38 - 126 U/L Final   ??? WBC 10/16/2018 7.8  4.5 - 11.0 10*9/L Final   ??? RBC 10/16/2018 4.73  4.50 - 5.90 10*12/L Final   ??? HGB 10/16/2018 13.2* 13.5 - 17.5 g/dL Final   ??? HCT 84/69/6295 42.1  41.0 - 53.0 % Final   ??? MCV 10/16/2018 89.1  80.0 - 100.0 fL Final   ??? MCH 10/16/2018 28.0  26.0 - 34.0 pg Final   ??? MCHC 10/16/2018 31.4  31.0 - 37.0 g/dL Final   ??? RDW 28/41/3244 15.1* 12.0 - 15.0 % Final   ??? MPV 10/16/2018 8.4  7.0 - 10.0 fL Final   ??? Platelet 10/16/2018 235  150 - 440 10*9/L Final   ??? Neutrophils % 10/16/2018 31.1  % Final   ??? Lymphocytes % 10/16/2018 26.7  % Final   ??? Monocytes % 10/16/2018 4.9  % Final   ??? Eosinophils % 10/16/2018 33.4  % Final   ??? Basophils % 10/16/2018 1.0  % Final   ??? Absolute Neutrophils 10/16/2018 2.4  2.0 - 7.5 10*9/L Final   ??? Absolute Lymphocytes 10/16/2018 2.1  1.5 - 5.0 10*9/L Final   ??? Absolute Monocytes 10/16/2018 0.4  0.2 - 0.8 10*9/L Final   ??? Absolute Eosinophils 10/16/2018 2.6* 0.0 - 0.4 10*9/L Final   ??? Absolute Basophils 10/16/2018 0.1  0.0 - 0.1 10*9/L Final   ??? Large Unstained Cells 10/16/2018 3  0 - 4 % Final   ??? Hypochromasia 10/16/2018 Slight* Not Present Final

## 2018-10-17 NOTE — Unmapped (Signed)
Change in Bexarotene  dosage incerease. Refill too soon until 10/25/2018. Will attempt test claim on 10/27/2018 date.

## 2018-10-19 MED ORDER — LEVOTHYROXINE 75 MCG TABLET
ORAL_TABLET | Freq: Every day | ORAL | 1 refills | 90.00000 days | Status: CP
Start: 2018-10-19 — End: 2019-10-19

## 2018-10-21 NOTE — Unmapped (Signed)
Free T4 low, levothyroxine increased to daily

## 2018-10-27 NOTE — Unmapped (Signed)
Change in Bexarotene dosage increase. Co-pay $100.00.

## 2018-10-31 NOTE — Unmapped (Signed)
[facility] Pharm[facility] Refill Coordination Note    Specialty Medication(s) to be Shipped:   Hematology/Oncology: Targretin    Other medication(s) to be shipped: na     Aaron Wilson, DOB: 05-May-1945  Phone: [phone] (home) [phone] (work)      All above HIPAA information was verified with patient.     Completed refill call assessment today to schedule patient's medication shipment from the [facility] [phone]).       Specialty medication(s) and dose(s) confirmed: Regimen is correct and unchanged.   Changes to medications: Aaron Wilson reports no changes at this time.  Changes to insurance: No  Questions for the pharmacist: No    Confirmed patient received Welcome Packet with first shipment. The patient will receive a drug information handout for each medication shipped and additional FDA Medication Guides as required.       DISEASE/MEDICATION-SPECIFIC INFORMATION        N/A    SPECIALTY MEDICATION ADHERENCE     Medication Adherence    Patient reported X missed doses in the last month: 0  Specialty Medication: Bexarotene 75 mg  Patient is on additional specialty medications: No  Any gaps in refill history greater than 2 weeks in the last 3 months: no  Demonstrates understanding of importance of adherence: yes  Informant: patient  Reliability of informant: reliable  Confirmed plan for next specialty medication refill: delivery by pharmacy  Refills needed for supportive medications: not needed              Bexarotene 75 mg. 7 days on hand      SHIPPING     Shipping address confirmed in Epic.     Delivery Scheduled: Yes, Expected medication delivery date: 100620.     Medication will be delivered via UPS to the prescription address in Epic WAM.    [name] D [name]   [facility] Specialty Technician

## 2018-11-03 MED FILL — BEXAROTENE 75 MG CAPSULE: 30 days supply | Qty: 180 | Fill #0 | Status: AC

## 2018-12-02 NOTE — Unmapped (Signed)
[facility] Pharm[facility] Refill Coordination Note    Specialty Medication(s) to be Shipped:   Hematology/Oncology: Targretin    Other medication(s) to be shipped: Aaron Wilson, DOB: 1945-11-10  Phone: [phone] (home) [phone] (work)      All above HIPAA information was verified with patient.     Completed refill call assessment today to schedule patient's medication shipment from the [facility] [phone]).       Specialty medication(s) and dose(s) confirmed: Regimen is correct and unchanged.   Changes to medications: [name]  Changes to insurance: No  Questions for the pharmacist: No    Confirmed patient received Welcome Packet with first shipment. The patient will receive a drug information handout for each medication shipped and additional FDA Medication Guides as required.       DISEASE/MEDICATION-SPECIFIC INFORMATION        N/A    SPECIALTY MEDICATION ADHERENCE     Medication Adherence    Patient reported X missed doses in the last month: 0  Specialty Medication: Bexarotene 75 mg  Patient is on additional specialty medications: No  Any gaps in refill history greater than 2 weeks in the last 3 months: no  Demonstrates understanding of importance of adherence: yes  Informant: patient  Reliability of informant: reliable  Confirmed plan for next specialty medication refill: delivery by pharmacy  Refills needed for supportive medications: not needed                Bexarotene 75 mg. 7 days on hand      SHIPPING     Shipping address confirmed in Epic.     Delivery Scheduled: Yes, Expected medication delivery date: 110520.     Medication will be delivered via UPS to the prescription address in Epic WAM.    Aaron Wilson   [facility] Specialty Technician

## 2018-12-03 MED FILL — BEXAROTENE 75 MG CAPSULE: 30 days supply | Qty: 180 | Fill #1 | Status: AC

## 2018-12-03 MED FILL — BEXAROTENE 75 MG CAPSULE: ORAL | 30 days supply | Qty: 180 | Fill #1

## 2018-12-17 NOTE — Unmapped (Signed)
Hi,    Patient Aaron Wilson contacted the Communication Center to cancel their appointment for tomorrow.  The appointment has been cancelled.    Cancellation Reason: Transportation     Thank you,  Drema Balzarine  [facility]   [phone]

## 2018-12-17 NOTE — Unmapped (Signed)
Spoke with patient and due to transportation issues he is unable to make today. I got him rescheduled for 12/17

## 2019-01-05 NOTE — Unmapped (Signed)
Aaron Wilson reports things are still going well with his bexarotene (currently at 6 caps/day). He has noticed improvements in his skin clearing and less itching. He has a follow up with Dr. Odis Luster next week.     [facility] Clinical Assessment & Refill Coordination Note    Aaron Wilson, DOB: 05-Nov-1945  Phone: [phone] (home) [phone] (work)    All above HIPAA information was verified with patient.     Was a Nurse, learning disability used for this call? No    Specialty Medication(s):   Hematology/Oncology: bexarotene     Current Outpatient Medications   Medication Sig Dispense Refill   ??? aspirin (ECOTRIN) 81 MG tablet Take 81 mg by mouth once as needed.     ??? atorvastatin (LIPITOR) 40 MG tablet TAKE 1 TABLET BY MOUTH ONCE DAILY FOR CHOLESTEROL 30 tablet 0   ??? betamethasone dipropionate (DIPROLENE) 0.05 % ointment Apply topically two (2) times a day.     ??? bexarotene (TARGRETIN) 75 mg capsule Take 6 capsules (450 mg total) by mouth daily. 180 capsule 2   ??? cetirizine (ZYRTEC) 10 MG tablet Take 1 tablet (10 mg total) by mouth daily. 30 tablet 5   ??? ciclopirox (LOPROX) 0.77 % cream Apply topically two (2) times a day as needed.     ??? clobetasoL (TEMOVATE) 0.05 % ointment Apply topically Two (2) times a day. As needed for itching (Patient not taking: Reported on 10/16/2018) 120 g 5   ??? gabapentin (NEURONTIN) 300 MG capsule Take 1 capsule (300 mg total) by mouth nightly. 30 capsule 11   ??? hydrOXYzine (ATARAX) 25 MG tablet Take 1 tablet (25 mg total) by mouth nightly. (Patient not taking: Reported on 10/16/2018) 30 tablet 5   ??? levothyroxine (SYNTHROID) 75 MCG tablet Take 1 tablet (75 mcg total) by mouth daily. 90 tablet 1   ??? metFORMIN (GLUCOPHAGE) 500 MG tablet Take 500 mg by mouth two (2) times a day.     ??? omega-3 acid ethyl esters (LOVAZA) 1 gram capsule Take 1 capsule by mouth two (2) times a day.     ??? tamsulosin (FLOMAX) 0.4 mg capsule Take 0.4 mg by mouth nightly. No current facility-administered medications for this visit.         Changes to medications: [name] reports no changes at this time.    No Known Allergies    Changes to allergies: No    SPECIALTY MEDICATION ADHERENCE     Bexarotene - unclear. Just opened large bottle but unable to determine how many that contained. Has at least a few days left (takes 6/day, so supply goes quickly).     Specialty medication(s) dose(s) confirmed: Regimen is correct and unchanged.     Are there any concerns with adherence? No    Adherence counseling provided? Not needed    CLINICAL MANAGEMENT AND INTERVENTION      Clinical Benefit Assessment:    Do you feel the medicine is effective or helping your condition? Yes    Clinical Benefit counseling provided? Not needed    Adverse Effects Assessment:    Are you experiencing any side effects? No    Are you experiencing difficulty administering your medicine? No    Quality of Life Assessment:    How many days over the past month did your Mycosis Fungoides  keep you from your normal activities? For example, brushing your teeth or getting up in the morning. 0    Have you discussed this with your provider? Not  needed    Therapy Appropriateness:    Is therapy appropriate? Yes, therapy is appropriate and should be continued    DISEASE/MEDICATION-SPECIFIC INFORMATION      N/A    PATIENT SPECIFIC NEEDS     ? Does the patient have any physical, cognitive, or cultural barriers? No    ? Is the patient high risk? Yes, patient is taking oral chemotherapy. Appropriateness of therapy as been assessed.     ? Does the patient require a Care Management Plan? No     ? Does the patient require physician intervention or other additional services (i.e. nutrition, smoking cessation, social work)? No      SHIPPING     Specialty Medication(s) to be Shipped:   Hematology/Oncology: bexarotene    Other medication(s) to be shipped: na     Changes to insurance: No Delivery Scheduled: Yes, Expected medication delivery date: Wed, Dec 9.     Medication will be delivered via UPS to the confirmed prescription address in [facility].    The patient will receive a drug information handout for each medication shipped and additional FDA Medication Guides as required.  Verified that patient has previously received a Conservation officer, historic buildings.    All of the patient's questions and concerns have been addressed.    Lanney Gins   [facility] Specialty Pharmacist

## 2019-01-06 MED FILL — BEXAROTENE 75 MG CAPSULE: 30 days supply | Qty: 180 | Fill #2 | Status: AC

## 2019-01-06 MED FILL — BEXAROTENE 75 MG CAPSULE: ORAL | 30 days supply | Qty: 180 | Fill #2

## 2019-01-08 ENCOUNTER — Other Ambulatory Visit: Payer: Self-pay

## 2019-01-08 ENCOUNTER — Emergency Department (HOSPITAL_COMMUNITY)
Admission: EM | Admit: 2019-01-08 | Discharge: 2019-01-08 | Disposition: A | Discharge status: Home / Self Care | Payer: Medicare Other | Attending: Emergency Medicine | Admitting: Emergency Medicine

## 2019-01-08 ENCOUNTER — Encounter (HOSPITAL_COMMUNITY): Payer: Self-pay | Admitting: *Deleted

## 2019-01-08 DIAGNOSIS — E119 Type 2 diabetes mellitus without complications: Secondary | ICD-10-CM | POA: Diagnosis not present

## 2019-01-08 DIAGNOSIS — Z7984 Long term (current) use of oral hypoglycemic drugs: Secondary | ICD-10-CM | POA: Diagnosis not present

## 2019-01-08 DIAGNOSIS — Z87891 Personal history of nicotine dependence: Secondary | ICD-10-CM | POA: Diagnosis not present

## 2019-01-08 DIAGNOSIS — G4700 Insomnia, unspecified: Secondary | ICD-10-CM | POA: Diagnosis not present

## 2019-01-08 DIAGNOSIS — I10 Essential (primary) hypertension: Secondary | ICD-10-CM | POA: Diagnosis not present

## 2019-01-08 MED ORDER — HYDROXYZINE HCL 25 MG PO TABS: 25.0000 mg | ORAL_TABLET | Freq: Four times a day (QID) | ORAL | 0 refills | Status: DC

## 2019-01-08 NOTE — ED Triage Notes (Signed)
Patient reports insomnia for the last 3 nights.  Patient does not report any pain, new medications and has not tried anything to help him sleep.  Patient is alert, oriented, no noted distress  in triage.

## 2019-01-08 NOTE — Discharge Instructions (Signed)
Take Hydroxyzine once before bedtime Follow up with your doctor

## 2019-01-08 NOTE — ED Provider Notes (Signed)
[facility] Provider Note   CSN: [id number] Arrival date & time: 01/08/19  1442     History Chief Complaint  Patient presents with  . Insomnia    Cory Lynch is a 73 y.o. male who presents with insomnia.  He states that he has not been able to sleep in the past 2 to 3 days.  He will sleep for 10 minutes during the day but otherwise has not been able to rest.  He denies any headache, chest pain, shortness of breath, abdominal pain.  He has not been started on any new medications or changed his medicines recently he has not tried anything over-the-counter.  He said he started to call his doctor but "they were out of town". Nothing makes it better or worse. He is requesting medication to help him sleep  HPI     Past Medical History:  Diagnosis Date  . Diabetes mellitus without complication ([place])   . Hypercholesteremia   . Hypertension     Patient Active Problem List   Diagnosis Date Noted  . CLL (chronic lymphocytic leukemia) ([place]) 10/05/2016    Past Surgical History:  Procedure Laterality Date  . CYSTOSCOPY WITH LITHOLAPAXY N/A 09/17/2016   Procedure: CYSTOSCOPY WITH LITHOLAPAXY;  Surgeon: Cleon Gustin, MD;  Location: [facility];  Service: Urology;  Laterality: N/A;  30 MINS [phone] [id number]  . HOLMIUM LASER APPLICATION N/A [id number]   Procedure: HOLMIUM LASER APPLICATION;  Surgeon: Cleon Gustin, MD;  Location: [facility];  Service: Urology;  Laterality: N/A;       Family History  Problem Relation Age of Onset  . Cancer Brother     Social History   Tobacco Use  . Smoking status: Former Smoker    Packs/day: 0.25    Types: Cigarettes    Quit date: 09/14/2013    Years since quitting: 5.3  . Smokeless tobacco: Never Used  Substance Use Topics  . Alcohol use: No  . Drug use: No    Home Medications Prior to Admission medications   Medication Sig Start Date End Date Taking? Authorizing Provider    metFORMIN (GLUCOPHAGE) 500 MG tablet  01/30/17   [provider]  Omega-3 Fatty Acids (FISH OIL) 1000 MG CAPS Take 1 capsule by mouth daily.    [provider]  tamsulosin (FLOMAX) 0.4 MG CAPS capsule Take 1 capsule (0.4 mg total) by mouth daily after supper. 09/17/16   McKenzie, Candee Furbish, MD  traMADol (ULTRAM) 50 MG tablet Take 1 tablet (50 mg total) by mouth every 6 (six) hours as needed. 04/07/18   Triplett, Tammy, PA-C    Allergies    Patient has no known allergies.  Review of Systems   Review of Systems  Constitutional: Negative for fever.  Psychiatric/Behavioral: Positive for sleep disturbance.    Physical Exam Updated Vital Signs BP 125/79 (BP Location: Right Arm)   Pulse 72   Temp 98.9 F (37.2 C) (Oral)   Resp 15   Ht 5\' 7"  (1.702 m)   Wt 95.3 kg   SpO2 99%   BMI 32.89 kg/m   Physical Exam Vitals and nursing note reviewed.  Constitutional:      General: He is not in acute distress.    Appearance: Normal appearance. He is well-developed. He is not ill-appearing.     Comments: Calm, cooperative. NAD  HENT:     Head: Normocephalic and atraumatic.  Eyes:     General: No scleral icterus.  Right eye: No discharge.        Left eye: No discharge.     Conjunctiva/sclera: Conjunctivae normal.     Pupils: Pupils are equal, round, and reactive to light.  Cardiovascular:     Rate and Rhythm: Normal rate.  Pulmonary:     Effort: Pulmonary effort is normal. No respiratory distress.  Abdominal:     General: There is no distension.  Musculoskeletal:     Cervical back: Normal range of motion.  Skin:    General: Skin is warm and dry.  Neurological:     Mental Status: He is alert and oriented to person, place, and time.  Psychiatric:        Behavior: Behavior normal.     ED Results / Procedures / Treatments   Labs (all labs ordered are listed, but only abnormal results are displayed) Labs Reviewed - No data to  display  EKG None  Radiology No results found.  Procedures Procedures (including critical care time)  Medications Ordered in ED Medications - No data to display  ED Course  I have reviewed the triage vital signs and the nursing notes.  Pertinent labs & imaging results that were available during my care of the patient were reviewed by me and considered in my medical decision making (see chart for details).    MDM Rules/Calculators/A&P  73 year old presents with insomnia for 3 days. Unclear etiology but not an emergent problem and he has not other specific complaints. His vitals are normal. He was given rx for Hydroxyzine and advised to f/u with PCP  Final Clinical Impression(s) / ED Diagnoses Final diagnoses:  Insomnia, unspecified type    Rx / DC Orders ED Discharge Orders    None       Recardo Evangelist, PA-C 01/09/19 1610    Fredia Sorrow, MD 01/17/19 [phone]

## 2019-01-15 ENCOUNTER — Ambulatory Visit: Admit: 2019-01-15 | Discharge: 2019-01-15 | Payer: MEDICARE | Attending: Dermatology | Primary: Dermatology

## 2019-01-15 ENCOUNTER — Ambulatory Visit: Admit: 2019-01-15 | Discharge: 2019-01-15 | Payer: MEDICARE

## 2019-01-15 DIAGNOSIS — Z79899 Other long term (current) drug therapy: Principal | ICD-10-CM

## 2019-01-15 DIAGNOSIS — C8400 Mycosis fungoides, unspecified site: Principal | ICD-10-CM

## 2019-01-15 LAB — TRIGLYCERIDES
TRIGLYCERIDES: 771 mg/dL — ABNORMAL HIGH (ref 1–149)
Triglyceride:MCnc:Pt:Ser/Plas:Qn:: 771 — ABNORMAL HIGH

## 2019-01-15 LAB — CBC W/ AUTO DIFF
BASOPHILS RELATIVE PERCENT: 1.1 %
EOSINOPHILS ABSOLUTE COUNT: 3 10*9/L — ABNORMAL HIGH (ref 0.0–0.4)
EOSINOPHILS RELATIVE PERCENT: 38.7 %
HEMATOCRIT: 40.9 % — ABNORMAL LOW (ref 41.0–53.0)
HEMOGLOBIN: 12.9 g/dL — ABNORMAL LOW (ref 13.5–17.5)
LARGE UNSTAINED CELLS: 2 % (ref 0–4)
LYMPHOCYTES ABSOLUTE COUNT: 2 10*9/L (ref 1.5–5.0)
LYMPHOCYTES RELATIVE PERCENT: 25.6 %
MEAN CORPUSCULAR HEMOGLOBIN CONC: 31.4 g/dL (ref 31.0–37.0)
MEAN CORPUSCULAR HEMOGLOBIN: 28.3 pg (ref 26.0–34.0)
MEAN CORPUSCULAR VOLUME: 90 fL (ref 80.0–100.0)
MEAN PLATELET VOLUME: 8.8 fL (ref 7.0–10.0)
MONOCYTES RELATIVE PERCENT: 4.2 %
NEUTROPHILS ABSOLUTE COUNT: 2.2 10*9/L (ref 2.0–7.5)
NEUTROPHILS RELATIVE PERCENT: 28 %
PLATELET COUNT: 286 10*9/L (ref 150–440)
RED CELL DISTRIBUTION WIDTH: 15.1 % — ABNORMAL HIGH (ref 12.0–15.0)
WBC ADJUSTED: 7.7 10*9/L (ref 4.5–11.0)

## 2019-01-15 LAB — ALT (SGPT): Alanine aminotransferase:CCnc:Pt:Ser/Plas:Qn:: 31

## 2019-01-15 LAB — LYMPHOCYTES ABSOLUTE COUNT: Lymphocytes:NCnc:Pt:Bld:Qn:Automated count: 2

## 2019-01-15 LAB — SMEAR REVIEW

## 2019-01-15 LAB — FREE T4: Thyroxine.free:MCnc:Pt:Ser/Plas:Qn:: 0.26 — ABNORMAL LOW

## 2019-01-15 LAB — EGFR CKD-EPI AA MALE: Lab: >90

## 2019-01-15 LAB — CREATININE: CREATININE: 0.84 mg/dL (ref 0.70–1.30)

## 2019-01-15 LAB — AST (SGOT): Aspartate aminotransferase:CCnc:Pt:Ser/Plas:Qn:: 34

## 2019-01-15 MED ORDER — BEXAROTENE 75 MG CAPSULE
ORAL_CAPSULE | Freq: Every day | ORAL | 2 refills | 30 days | Status: CP
Start: 2019-01-15 — End: ?

## 2019-01-15 MED ORDER — TRIAMCINOLONE ACETONIDE 0.1 % TOPICAL CREAM
Freq: Two times a day (BID) | TOPICAL | 1 refills | 0 days | Status: CP
Start: 2019-01-15 — End: 2020-01-15

## 2019-01-15 NOTE — Unmapped (Signed)
Please follow-up with your neurologist with the loss of balance    I will message your primary doctor about your thyroid. Please keep taking the thyroid medication.

## 2019-01-15 NOTE — Unmapped (Signed)
[facility] -- Dermatology Note    ??  Assessment and Plan:     Aaron Wilson was seen today for routine follow-up.    Diagnoses and all orders for this visit:    High risk medication use  -     T4, Free; Future  -     CBC w/ Differential; Future  -     Triglycerides; Future  -     AST; Future  -     ALT; Future  -     Creatinine; Future  -     Creatinine  -     ALT  -     AST  -     Triglycerides  -     CBC w/ Differential  -     T4, Free    Mycosis fungoides, unspecified body region (CMS-HCC)  -     bexarotene (TARGRETIN) 75 mg capsule; Take 6 capsules (450 mg total) by mouth daily.  -     triamcinolone (KENALOG) 0.1 % cream; Apply topically Two (2) times a day.    Hypothyroidism due to medication  -     levothyroxine (SYNTHROID) 100 MCG tablet; Take 1 tablet (100 mcg total) by mouth daily.    Hypertriglyceridemia  -     atorvastatin (LIPITOR) 40 MG tablet; Take 1 tablet (40 mg total) by mouth daily.      Mycosis Fungoides/[place], T3NxM0B2, with evidence of folliculotropic infiltrates in patch-plaque lesions as well as more infiltrative tumors and nodules most prominent on the bilateral upper extremities. PET scan showed diffuse adenopathy with minimal/moderate FDG avidity.  - Counseled patient on etiology, natural history, course and treatment for the above conditions.  Reassurance and anticipatory guidance were provided  - Patient feels that he is getting some improvement on bexarotene, although I am not particularly convinced that his physical exam is any better. In fact, I think some of the tumors on his arm are slowly increasing in size. Discussed treatment options and that we could change treatments. I think radiation in particular would be helpful for the very indurated tumors on his arms. However, patient prefers to continue current therapy for now  - check labs today including free T4, lipids, CBC, CMP. Reviewed that bexarotene reliably causes central hypothyroidism meaning that his TSH will be very low when checked but he still needs to continue thyroid supplementation. We follow free T4 to monitor this and adjust as necessary.   - Patient had been on cholesterol medication but recently stopped, not sure why. 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 Will continue to monitor  - Discussed assessment and plan with Dr Elmon Kirschner (oncology) and we are in agreement    Bilateral upper extremity weakness, L>R, and bilateral finger numbness  - Patient reports being seen by a local neurologist for this and that it is stable, although not improved. He is on gabapentin and thinks this may be helping. He has some worsening loss of balance which he plans to discuss at his upcoming appointment for f/u  w/ neurology    Return to clinic in 30mo w/ me and Dr Elmon Kirschner in [facility]    The treatment plan was discussed with the patient, who voiced understanding, and all questions were answered.    Subjective:     Chief Complaint: f/u MF    HPI: This is a 73 y.o. male last seen 2 months ago, here for f/u MF. He is currently taking bexarotene 450mg  po daily, increased in 10/2018. He thinks that it is working well that his back is softer and less itchy. He thinks his arms are pretty much the same but they do not bother him. He is happy with the current treatment.    Notably, at last visit patient complained of worsened bilateral upper extremity weakness, L>R, which is new for him in mid 2020. He has since seen a neurologist for this but could not tell me what they think is going on.     Pertinent Past Medical History:   PCP - Dr Reche Dixon as [facility]    Mycosis Fungoides  Biopsy on 03/14/18:  Final Diagnosis   Date Value Ref Range Status   07/07/2018   Final    Peripheral blood, smear review and flow cytometry  -  Immunophenotypically aberrant T-cell population identified representing 12.6% (1,099 cells/uL) of peripheral white blood cells  -  Low level CLL-type monoclonal B-cell lymphocytosis, representing 0.8% (70 cells/uL) of the peripheral white blood cells by flow cytometric analysis (See Comment)  -  CD4-positive/CD7-negative cells represent 45% of lymphocytes  -  CD4:CD8 ratio = 11.4:1      This electronic signature is attestation that the pathologist personally reviewed the submitted material(s) and the final diagnosis reflects that evaluation.        Special Stains:  By outside report, special stains were performed but not provided for our review; see results below.  Special stains for organisms (PAS/F and Treponema pallidum) are performed and are negative.  ??  Outside stains of block 1A are reviewed.  CD3: CD3 stains the lymphoid infiltrate.  CD20:  CD20 stains scattered cells.  CD4:  CD4 stains the majority of cells in the lymphoid infiltrate.  CD8:  CD8 stains a very small subset of the lymphoid cells.  CD30:  CD30 stains scattered cells.  Langerhans:  Langerhans stains scattered cells.    PET scan:  -Multiple mildly hypermetabolic bilateral cervical lymph nodes involving levels II-V.  - Axillae/subpectoral: Multiple bilateral axillary/subpectoral adenopathy. For example, see 1.6 cm moderately FDG avid right node (CT: 121) and 1.2 cm left axillary node, mildly FDG avid (CT: 119).  - Mediastinum/hila: Scattered subcentimeter mediastinal nodes, all of which are non-FDG avid. For example: 0.7 cm para-aortic node (CT: 19). Minimal coronary calcifications.  - Adenopathy: Multiple mild to moderately hypermetabolic abdominal and pelvic lymph nodes, for example: 1.1 cm retrocaval (CT: 250), 2.2 cm right common iliac (4:283), bilateral external iliac (CT 335) and bilateral subcentimeter inguinal (CT: 371).   - Soft tissue nodularity of the anterior abdominal wall (CT: 237); may represent Pautrier microabscesses (T-cell aggregates in the epidermis) known to be associated with Mycosis Fungoides.    Diabetes mellitus, hypertension, hyperlipidemia    Family History:   No family history of melanoma     Social History:   Lives in [place], [place]  Works as a custodian     Review of Systems:  No fever, chills. No other skin complaints. Other than the HPI, the balance of 10 system reviewed is negative.  Objective:     Physical Examination:  General: Well-developed, well-nourished male in no acute distress, resting comfortably.  Neuro: A&Ox 3. Answers questions appropriately.  Psych: Mood and affect appropriate for age  Skin: Examination including inspection and palpation of the face, neck, chest, abdomen, back, bilateral upper extremities, palms, and nails was performed and notable for the following:  - Several erythematous folliculocentric papules, plaques and nodules with overlying scale and crust most predominantly on the bilateral upper extremities (L>R) but also involving the ears and back, some appear larger than prior exams  - numerous coalescing indurated papules and plaques on the back  - All other areas not specifically commented on are within normal limits.  Lymphatics: negative for palpable  Cervical, axillary, inguinal adenopathy  Neuro: decreased bilateral upper extremity strength, L>R, patient struggles to lift/push/pull

## 2019-01-15 NOTE — Unmapped (Deleted)
IDENTIFICATION: Aaron Wilson is a 73 y.o. male who presents for management of mycosis fungoides.       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 PET scan showed diffuse adenopathy with minimal/moderate FDG avidity. CD30 stained scattered cells.  Stage: Stage Iva (T3NxM0B2)  Regimen: 06/2018 - bexarotene 150mg /day and increased to 300mg /day on 08/14/18 and to 450mg  on 10/16/18    He may have already had some benefit from the bexarotene, but certainly not complete clearance. Since he is tolerating it well, we will increase his dose to 450mg /day.  IF he doesn't start to get more of an improvement we may consider referral for TSI.     For pruritus, he will continue neurontin 300mg  qhs.    OTHER ISSUES:   1. Bilateral upper extremity weakness and numbness: Patient notes new complaint of progressive weakness and numbness in his hand since the summer of 2020, with objective weakness to confrontation strength testing in bilateral arms. He has a normal gait and no lower extremity symptoms to suggest neuropathy in these areas. Involvement of both arms raises concern for bilateral radiculopathy (though this seems less likely in the setting of subacute concurrent bilateral symptoms) or more central compressive process. Symptoms are unlikely to relate to his CTCL or therapy. He warrants C (and likely T) spine imaging, and we advised that he follow up closely with his PCP for further evaluation. We otherwise advised him to call our office to coordinate imaging if he is not able to get in with his PCP in the next few days.    2. Drug induced hypothyroidism: Free T4 low at 0.45 so we will increase his synthroid 3. Moncoclonal B cell lymphocytosis: At this time he does not have cytopenias or an elevated WBC or lymphocyte count, so he does not currently meet criteria for CLL diagnosis. He does have some LAD on his recent PET scan, but given current presentation I think this is most likely due to his MF rather than CLL. At this time there is no urgency to biopsy a node to confirm that it is not from CLL since, even if CLL were present, no treatment would be indicated at this time.    4. Hypertriglyceridemia: His Tg have been slowly increaseing since we started bexarotene.  He is now at 441. He is baseline on atorvastatin. We will leave at current dose, but consider further intervention at next visit if they continue to elevate.  TG can increase risk of pancreatitis, typically if they get into the 800 or higher range.    Clarene Critchley, MD  Associate Professor  Division of Hematology        =========================================    HISTORY OF PRESENT ILLNESS:  Aaron Wilson developed red scaly bumps on his arms over a year ago with gradual spread to his back with associated pruritus interfering with sleep.  On 03/14/18 he had a LUE shave biopsy with folliculocentric MF and postiive T cell gene re-arrangements.  PET scan showed mildly hpermetabolic cervical nodes and axillary nodes that were 1.2-1.6cm in size and a 2.2 cm right common iliac node.    Flow cytometry of peripheral blood showed a monotypic B cell population most c/w monoclonal B cell lymphocytosis.  The CD4+/CD7- population represented 45% of lymphocytes with a CD4:CD8 ratio of 11:1.  The aberrant T cell population represented 12.6% (1099 cells/microL) of peripheral WBC.  HE was started on bexarotene 150mg /day, increased to 300mg /day in July and increased to 450mg /day in September. At the last visit he mentioned weakness and numbness in his BUE, which was confirmed on exam. HE has been following up with his PCP and I see our records showing that he had a normal folate and B12 level    He is seen today in follow up.  He reports that he feels his skin has cleared somewhat since his last evaluation, particularly on the left arm.  He has noted new numbness in his fingers and weakness in his bilateral arms, though he is not sure exactly how long this has been present. He says that he cannot bend at his elbows on the left side. He denies neck or back pain but reports some pain in the left shoulder.  No weakness or numbness in his legs.  He denies fevers, chills, nausea, vomiting and other new symptomatic complaints.  He states that his itching is minimal.      PAST MEDICAL HISTORY:  Past Medical History:   Diagnosis Date   ??? Allergic    ??? Arthritis    ??? Cancer (CMS-HCC)    ??? Diabetes mellitus (CMS-HCC)    ??? Drug-induced hypothyroidism 10/19/2018   ??? Hyperlipidemia    ??? Hypertension        MEDICATIONS:  Current Outpatient Medications   Medication Sig Dispense Refill   ??? aspirin (ECOTRIN) 81 MG tablet Take 81 mg by mouth once as needed.     ??? atorvastatin (LIPITOR) 40 MG tablet TAKE 1 TABLET BY MOUTH ONCE DAILY FOR CHOLESTEROL 30 tablet 0   ??? betamethasone dipropionate (DIPROLENE) 0.05 % ointment Apply topically two (2) times a day.     ??? bexarotene (TARGRETIN) 75 mg capsule Take 6 capsules (450 mg total) by mouth daily. 180 capsule 2   ??? cetirizine (ZYRTEC) 10 MG tablet Take 1 tablet (10 mg total) by mouth daily. 30 tablet 5   ??? ciclopirox (LOPROX) 0.77 % cream Apply topically two (2) times a day as needed.     ??? clobetasoL (TEMOVATE) 0.05 % ointment Apply topically Two (2) times a day. As needed for itching (Patient not taking: Reported on 10/16/2018) 120 g 5   ??? gabapentin (NEURONTIN) 300 MG capsule Take 1 capsule (300 mg total) by mouth nightly. 30 capsule 11 ??? hydrOXYzine (ATARAX) 25 MG tablet Take 1 tablet (25 mg total) by mouth nightly. (Patient not taking: Reported on 10/16/2018) 30 tablet 5   ??? levothyroxine (SYNTHROID) 75 MCG tablet Take 1 tablet (75 mcg total) by mouth daily. 90 tablet 1   ??? metFORMIN (GLUCOPHAGE) 500 MG tablet Take 500 mg by mouth two (2) times a day.     ??? omega-3 acid ethyl esters (LOVAZA) 1 gram capsule Take 1 capsule by mouth two (2) times a day.     ??? tamsulosin (FLOMAX) 0.4 mg capsule Take 0.4 mg by mouth nightly.       No current facility-administered medications for this visit.        ALLERGIES:  No Known Allergies    SOCIAL HISTORY:  Social History     Socioeconomic History   ??? Marital status: Single     Spouse name: Not on file   ??? Number of children: Not on file   ??? Years of education: Not on file   ??? Highest education level: Not on file   Occupational History   ??? Not on file   Social Needs   ???  Financial resource strain: Not on file   ??? Food insecurity     Worry: Not on file     Inability: Not on file   ??? Transportation needs     Medical: Not on file     Non-medical: Not on file   Tobacco Use   ??? Smoking status: Former Smoker     Types: Cigarettes     Quit date: 01/14/2013     Years since quitting: 6.0   ??? Smokeless tobacco: Never Used   Substance and Sexual Activity   ??? Alcohol use: Not Currently   ??? Drug use: Never   ??? Sexual activity: Not on file   Lifestyle   ??? Physical activity     Days per week: Not on file     Minutes per session: Not on file   ??? Stress: Not on file   Relationships   ??? Social Wellsite geologist on phone: Not on file     Gets together: Not on file     Attends religious service: Not on file     Active member of club or organization: Not on file     Attends meetings of clubs or organizations: Not on file     Relationship status: Not on file   Other Topics Concern   ??? Do you use sunscreen? No   ??? Tanning bed use? No   ??? Are you easily burned? No   ??? Excessive sun exposure? No   ??? Blistering sunburns? No Social History Narrative   ??? Not on file       FAMILY HISTORY:  Family History   Problem Relation Age of Onset   ??? Melanoma Neg Hx    ??? Basal cell carcinoma Neg Hx    ??? Squamous cell carcinoma Neg Hx        REVIEW OF SYSTEMS:  See HPI. A 10 system ROS is otherwise negative.    VITAL SIGNS:   There were no vitals filed for this visit.    EXAM:  CONST: NAD, Awake, Alert  HEENT: Oropharynx clear.   LYMPH: No cervical, supraclavicular, axillary, or inguinal LAD  RESP: Clear to auscultation bilaterally.  CV: Regular rate and rhythm. No rubs, gallops or murmurs.   GI: Soft, nontender, nondistended. No hepatosplenomegaly.   MSK: No edema.  Skin: Several erythematous folliculocentric papules, plaques and nodules with overlying scale and crust most predominantly on the bilateral upper extremities (L>R) but also involving the ears and back, with some clearing of the left arm compared to last visit.  NEURO: No focal deficits  Neuro: A&Ox 3. Answers questions appropriately.  Subjective numbness to light touch in bilateral fingers.  3-/5 left and 4-/5 right elbow flexion weakness   4/5 elbow extension weakness  3/5 bilateral shoulder abduction  Preserved pincer grasp strength  Preserved lower extremity strength to confrontation testing      LABORATORY:  No visits with results within 1 Day(s) from this visit.   Latest known visit with results is:   Office Visit on 10/16/2018   Component Date Value Ref Range Status   ??? Free T4 10/16/2018 0.45* 0.71 - 1.40 ng/dL Final   ??? Cholesterol 10/16/2018 200* 100 - 199 mg/dL Final   ??? Triglycerides 10/16/2018 441* 1 - 149 mg/dL Final

## 2019-01-16 MED ORDER — ATORVASTATIN 40 MG TABLET
ORAL_TABLET | Freq: Every day | ORAL | 1 refills | 90.00000 days | Status: CP
Start: 2019-01-16 — End: ?

## 2019-01-16 MED ORDER — LEVOTHYROXINE 100 MCG TABLET
ORAL_TABLET | Freq: Every day | ORAL | 1 refills | 90 days | Status: CP
Start: 2019-01-16 — End: 2020-01-16

## 2019-01-16 NOTE — Unmapped (Signed)
Free T4 is low at 0.26 and TG high at 771. Patient at visit had said he was taking his synthroid but not his atorvastatin. Tried calling him twice to discuss temporarily decreasing bexarotene and adjusting thyroid supplementation and restarting statin, but no answer. Attempted to call his PCP's office but they are closed until Monday. Will try again later.

## 2019-01-17 NOTE — Unmapped (Signed)
Called and discussed results with patient. Will increase synthroid to , restart atorvastatin 40 and decrease bexarotene temporarily to 300mg  po daily. F/u in 47mo as planned or earlier if needed.

## 2019-01-22 NOTE — Unmapped (Signed)
Patient Aaron Wilson was contacted today regarding scheduled appt.'s for 03/19/2019. Voicemail has not been set up, therefore I have mailed the patient appt. Reminders.

## 2019-01-29 NOTE — Unmapped (Signed)
His dose has been decreased to 4 caps/day.  He has more than 2 weeks of medicine on hand and would like a call back in 10 days.  Will scheduled refill call accordingly,

## 2019-02-04 ENCOUNTER — Other Ambulatory Visit: Payer: Self-pay | Admitting: Neurology

## 2019-02-04 ENCOUNTER — Other Ambulatory Visit (HOSPITAL_COMMUNITY): Payer: Self-pay | Admitting: Neurology

## 2019-02-04 DIAGNOSIS — G959 Disease of spinal cord, unspecified: Secondary | ICD-10-CM

## 2019-02-11 DIAGNOSIS — C8400 Mycosis fungoides, unspecified site: Principal | ICD-10-CM

## 2019-02-12 NOTE — Unmapped (Signed)
Patient states he has a full bottle and 3 to 4 capsules in another bottle. Obtained new insurance and copay is $200. Patient agreed with plan of entering a referral for copay assistance and rescheduling his refill call for 2 weeks from now. Patient also reports taking differently than prescribed - 4 caps daily. Sent refill request to provider for updated prescription.

## 2019-02-13 MED ORDER — BEXAROTENE 75 MG CAPSULE
ORAL_CAPSULE | Freq: Every day | ORAL | 2 refills | 30.00000 days | Status: CP
Start: 2019-02-13 — End: ?
  Filled 2019-03-03: qty 120, 30d supply, fill #0

## 2019-02-25 NOTE — Unmapped (Signed)
[facility] Refill Coordination Note    Specialty Medication(s) to be Shipped:   Hematology/Oncology: Bexarotene 75mg     Other medication(s) to be shipped: n/a     Aaron Wilson, DOB: Mar 29, 1945  Phone: [phone] (home) [phone] (work)      All above HIPAA information was verified with patient.     Was a Nurse, learning disability used for this call? No    Completed refill call assessment today to schedule patient's medication shipment from the [facility] [phone]).       Specialty medication(s) and dose(s) confirmed: Regimen is correct and unchanged.   Changes to medications: Aaron Wilson reports no changes at this time.  Changes to insurance: No  Questions for the pharmacist: No    Confirmed patient received Welcome Packet with first shipment. The patient will receive a drug information handout for each medication shipped and additional FDA Medication Guides as required.       DISEASE/MEDICATION-SPECIFIC INFORMATION        N/A    SPECIALTY MEDICATION ADHERENCE     Medication Adherence    Patient reported X missed doses in the last month: 0  Specialty Medication: Bexarotene 75mg   Informant: patient                Bexarotene 75 mg: 10 days of medicine on hand         SHIPPING     Shipping address confirmed in Epic.     Delivery Scheduled: Yes, Expected medication delivery date: 03/03/19.     Medication will be delivered via UPS to the prescription address in Epic WAM.    Aaron Wilson   [facility] Specialty Technician

## 2019-03-02 ENCOUNTER — Other Ambulatory Visit (HOSPITAL_COMMUNITY): Payer: Self-pay | Admitting: Neurology

## 2019-03-02 DIAGNOSIS — G959 Disease of spinal cord, unspecified: Secondary | ICD-10-CM

## 2019-03-02 NOTE — Unmapped (Signed)
Aaron Wilson 's bexarotene shipment will be delayed as a result of a high copay.     I have reached out to the patient and left a voicemail message.  We will wait for a call back from the patient to reschedule the delivery.  We have not confirmed the new delivery date.

## 2019-03-03 MED FILL — BEXAROTENE 75 MG CAPSULE: 30 days supply | Qty: 120 | Fill #0 | Status: AC

## 2019-03-03 NOTE — Unmapped (Signed)
Aaron Wilson 's BEXAROTENE shipment will be sent out  as a result of copay is now approved by patient/caregiver.      I have reached out to the patient and communicated the delivery change. We will reschedule the medication for the delivery date that the patient agreed upon.  We have confirmed the delivery date as 03/04/19, via ups.

## 2019-03-18 DIAGNOSIS — C8400 Mycosis fungoides, unspecified site: Principal | ICD-10-CM

## 2019-03-18 DIAGNOSIS — E032 Hypothyroidism due to medicaments and other exogenous substances: Principal | ICD-10-CM

## 2019-03-18 DIAGNOSIS — E785 Hyperlipidemia, unspecified: Principal | ICD-10-CM

## 2019-03-18 NOTE — Unmapped (Signed)
Hi,     [employer] contacted the Communication Center regarding the following:    - Would like to reschedule 03/19/19 appointments due to the weather. I tried to reschedule but the next day with early appointments is in 05/2019.    Please contact patient at [phone].    Thanks in advance,    Laverna Peace  [facility]   [phone]

## 2019-03-18 NOTE — Unmapped (Signed)
I spoke with patient Aaron Wilson to confirm appointments on the following date(s): per pt request to reschedule appts due to potential bad weather. Pt has been rescheduled for 3/18 with labs and T cell clinic     Samella Parr

## 2019-03-30 NOTE — Unmapped (Signed)
Unable to reach patient after 3 call attempts to inform of covid 19 vaccine eligibility (calls made 2/18, 2/19 and 3/1)

## 2019-04-01 NOTE — Unmapped (Signed)
03/03-pt states still having 1 month of Bexarotene on hand,he states not missing any doses,advised pt we would call back in 3 weeks to reschedule delivery-CB

## 2019-04-13 ENCOUNTER — Other Ambulatory Visit (HOSPITAL_COMMUNITY): Payer: Self-pay | Admitting: Neurology

## 2019-04-13 DIAGNOSIS — M4712 Other spondylosis with myelopathy, cervical region: Secondary | ICD-10-CM

## 2019-04-15 NOTE — Unmapped (Deleted)
IDENTIFICATION: Aaron Wilson is a 74 y.o. male who presents for management of mycosis fungoides.      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 PET scan showed diffuse adenopathy with minimal/moderate FDG avidity. CD30 stained scattered cells.  Stage: Stage Iva (T3NxM0B2)  Regimen: 06/2018 - bexarotene 150mg /day and increased to 300mg /day on 08/14/18 and to 450mg  on 10/16/18    He may have already had some benefit from the bexarotene, but certainly not complete clearance. Since he is tolerating it well, we will increase his dose to 450mg /day.  IF he doesn't start to get more of an improvement we may consider referral for TSI.     For pruritus, he will continue neurontin 300mg  qhs.    OTHER ISSUES:   1. Bilateral upper extremity weakness and numbness: Patient notes new complaint of progressive weakness and numbness in his hands for the last 1-2 months, with objective weakness to confrontation strength testing in bilateral arms. He has a normal gait and no lower extremity symptoms to suggest neuropathy in these areas. Involvement of both arms raises concern for bilateral radiculopathy (though this seems less likely in the setting of subacute concurrent bilateral symptoms) or more central compressive process. Symptoms are unlikely to relate to his CTCL or therapy. He warrants C (and likely T) spine imaging, and we advised that he follow up closely with his PCP for further evaluation. We otherwise advised him to call our office to coordinate imaging if he is not able to get in with his PCP in the next few days.    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 He does have some LAD on his recent PET scan, but given current presentation I think this is most likely due to his MF rather than CLL. At this time there is no urgency to biopsy a node to confirm that it is not from CLL since, even if CLL were present, no treatment would be indicated at this time.    4. Hypertriglyceridemia: His Tg have been slowly increaseing since we started bexarotene.  He is now at 441. He is baseline on atorvastatin. We will leave at current dose, but consider further intervention at next visit if they continue to elevate.  TG can increase risk of pancreatitis, typically if they get into the 800 or higher range.    Return to CTCL clinic in 2 months or sooner as needed.    Patient seen and discussed with Dr. Elmon Kirschner.  Patient seen in parallel with dermatology.  Aaron Brim, MD  Hematology and Oncology Fellow     I saw and evaluated the patient, participating in the key portions of the service.?? I reviewed the resident???s note.?? I agree with the resident???s findings and plan. Gae Bon, MD          =========================================    HISTORY OF PRESENT ILLNESS:  Aaron Wilson developed red scaly bumps on his arms over a year ago with gradual spread to his back with associated pruritus interfering with sleep.  On 03/14/18 he had a LUE shave biopsy with folliculocentric MF and postiive T cell gene re-arrangements.  PET scan showed mildly hpermetabolic cervical nodes and  axillary nodes that were 1.2-1.6cm in size and a 2.2 cm right common iliac node.    Flow cytometry of peripheral blood showed a monotypic B cell population most c/w monoclonal B cell lymphocytosis.  The CD4+/CD7- population represented 45% of lymphocytes with a CD4:CD8 ratio of 11:1.  The aberrant T cell population represented 12.6% (1099 cells/microL) of peripheral WBC.    He saw Dr Odis Luster in clinc in June and was started on bexarotene 150mg /day, increased to 300mg /day in July.    He is seen today in follow up.  He reports that he feels his skin has cleared somewhat since his last evaluation, particularly on the left arm.  He has noted new numbness in his fingers and weakness in his bilateral arms, though he is not sure exactly how long this has been present. He says that he cannot bend at his elbows on the left side. He denies neck or back pain but reports some pain in the left shoulder.  No weakness or numbness in his legs.  He denies fevers, chills, nausea, vomiting and other new symptomatic complaints.  He states that his itching is minimal.      PAST MEDICAL HISTORY:  Past Medical History:   Diagnosis Date   ??? Allergic    ??? Arthritis    ??? Cancer (CMS-HCC)    ??? Diabetes mellitus (CMS-HCC)    ??? Drug-induced hypothyroidism 10/19/2018   ??? Hyperlipidemia    ??? Hypertension        MEDICATIONS:  Current Outpatient Medications   Medication Sig Dispense Refill   ??? aspirin (ECOTRIN) 81 MG tablet Take 81 mg by mouth once as needed.     ??? atorvastatin (LIPITOR) 40 MG tablet Take 1 tablet (40 mg total) by mouth daily. 90 tablet 1   ??? betamethasone dipropionate (DIPROLENE) 0.05 % ointment Apply topically two (2) times a day.     ??? bexarotene (TARGRETIN) 75 mg capsule Take 4 capsules (300 mg total) by mouth daily. 120 capsule 2   ??? cetirizine (ZYRTEC) 10 MG tablet Take 1 tablet (10 mg total) by mouth daily. 30 tablet 5   ??? ciclopirox (LOPROX) 0.77 % cream Apply topically two (2) times a day as needed.     ??? clobetasoL (TEMOVATE) 0.05 % ointment Apply topically Two (2) times a day. As needed for itching 120 g 5   ??? gabapentin (NEURONTIN) 300 MG capsule Take 1 capsule (300 mg total) by mouth nightly. 30 capsule 11   ??? hydrOXYzine (ATARAX) 25 MG tablet Take 1 tablet (25 mg total) by mouth nightly. (Patient not taking: Reported on 10/16/2018) 30 tablet 5   ??? levothyroxine (SYNTHROID) 100 MCG tablet Take 1 tablet (100 mcg total) by mouth daily. 90 tablet 1   ??? metFORMIN (GLUCOPHAGE) 500 MG tablet Take 500 mg by mouth two (2) times a day.     ??? omega-3 acid ethyl esters (LOVAZA) 1 gram capsule Take 1 capsule by mouth two (2) times a day.     ??? tamsulosin (FLOMAX) 0.4 mg capsule Take 0.4 mg by mouth nightly.     ??? triamcinolone (KENALOG) 0.1 % cream Apply topically Two (2) times a day. 30 g 1     No current facility-administered medications for this visit.        ALLERGIES:  No Known Allergies    SOCIAL HISTORY:  Social History     Socioeconomic History   ??? Marital status: Single     Spouse name: Not on file   ???  Number of children: Not on file   ??? Years of education: Not on file   ??? Highest education level: Not on file   Occupational History   ??? Not on file   Social Needs   ??? Financial resource strain: Not on file   ??? Food insecurity     Worry: Not on file     Inability: Not on file   ??? Transportation needs     Medical: Not on file     Non-medical: Not on file   Tobacco Use   ??? Smoking status: Former Smoker     Types: Cigarettes     Quit date: 01/14/2013     Years since quitting: 6.2   ??? Smokeless tobacco: Never Used   Substance and Sexual Activity   ??? Alcohol use: Not Currently   ??? Drug use: Never   ??? Sexual activity: Not on file   Lifestyle   ??? Physical activity     Days per week: Not on file     Minutes per session: Not on file   ??? Stress: Not on file   Relationships   ??? Social Wellsite geologist on phone: Not on file     Gets together: Not on file     Attends religious service: Not on file     Active member of club or organization: Not on file     Attends meetings of clubs or organizations: Not on file     Relationship status: Not on file   Other Topics Concern   ??? Do you use sunscreen? No   ??? Tanning bed use? No   ??? Are you easily burned? No   ??? Excessive sun exposure? No   ??? Blistering sunburns? No   Social History Narrative   ??? Not on file       FAMILY HISTORY:  Family History   Problem Relation Age of Onset   ??? Melanoma Neg Hx    ??? Basal cell carcinoma Neg Hx    ??? Squamous cell carcinoma Neg Hx        REVIEW OF SYSTEMS:  See HPI. A 10 system ROS is otherwise negative.    VITAL SIGNS:   There were no vitals filed for this visit.    EXAM:  CONST: NAD, Awake, Alert  HEENT: Oropharynx clear.   LYMPH: No cervical, supraclavicular, axillary, or inguinal LAD  RESP: Clear to auscultation bilaterally.  CV: Regular rate and rhythm. No rubs, gallops or murmurs.   GI: Soft, nontender, nondistended. No hepatosplenomegaly.   MSK: No edema.  Skin: Several erythematous folliculocentric papules, plaques and nodules with overlying scale and crust most predominantly on the bilateral upper extremities (L>R) but also involving the ears and back, with some clearing of the left arm compared to last visit.  NEURO: No focal deficits  Neuro: A&Ox 3. Answers questions appropriately.  Subjective numbness to light touch in bilateral fingers.  3-/5 left and 4-/5 right elbow flexion weakness   4/5 elbow extension weakness  3/5 bilateral shoulder abduction  Preserved pincer grasp strength  Preserved lower extremity strength to confrontation testing      LABORATORY:  No visits with results within 1 Day(s) from this visit.   Latest known visit with results is:   Office Visit on 01/15/2019   Component Date Value Ref Range Status   ??? Creatinine 01/15/2019 0.84  0.70 - 1.30 mg/dL Final   ??? EGFR CKD-EPI Non-African American,* 01/15/2019 88  >=60  mL/min/1.58m2 Final   ??? EGFR CKD-EPI African American, Male 01/15/2019 >90  >=60 mL/min/1.10m2 Final   ??? ALT 01/15/2019 31  <50 U/L Final   ??? AST 01/15/2019 34  19 - 55 U/L Final   ??? Triglycerides 01/15/2019 771* 1 - 149 mg/dL Final   ??? Free T4 62/13/0865 0.26* 0.71 - 1.40 ng/dL Final   ??? WBC 78/46/9629 7.7  4.5 - 11.0 10*9/L Final   ??? RBC 01/15/2019 4.55  4.50 - 5.90 10*12/L Final   ??? HGB 01/15/2019 12.9* 13.5 - 17.5 g/dL Final   ??? HCT 52/84/1324 40.9* 41.0 - 53.0 % Final   ??? MCV 01/15/2019 90.0  80.0 - 100.0 fL Final   ??? MCH 01/15/2019 28.3  26.0 - 34.0 pg Final   ??? MCHC 01/15/2019 31.4  31.0 - 37.0 g/dL Final   ??? RDW 40/10/2723 15.1* 12.0 - 15.0 % Final   ??? MPV 01/15/2019 8.8  7.0 - 10.0 fL Final   ??? Platelet 01/15/2019 286  150 - 440 10*9/L Final   ??? Neutrophils % 01/15/2019 28.0  % Final   ??? Lymphocytes % 01/15/2019 25.6  % Final   ??? Monocytes % 01/15/2019 4.2  % Final   ??? Eosinophils % 01/15/2019 38.7  % Final   ??? Basophils % 01/15/2019 1.1  % Final   ??? Absolute Neutrophils 01/15/2019 2.2  2.0 - 7.5 10*9/L Final   ??? Absolute Lymphocytes 01/15/2019 2.0  1.5 - 5.0 10*9/L Final   ??? Absolute Monocytes 01/15/2019 0.3  0.2 - 0.8 10*9/L Final   ??? Absolute Eosinophils 01/15/2019 3.0* 0.0 - 0.4 10*9/L Final   ??? Absolute Basophils 01/15/2019 0.1  0.0 - 0.1 10*9/L Final   ??? Large Unstained Cells 01/15/2019 2  0 - 4 % Final   ??? Hypochromasia 01/15/2019 Marked* Not Present Final   ??? Smear Review Comments 01/15/2019 See Comment* Undefined Final    Slide Reviewed

## 2019-04-23 NOTE — Unmapped (Signed)
[facility] Refill Coordination Note    Specialty Medication(s) to be Shipped:   Hematology/Oncology: Targretin    Other medication(s) to be shipped: n/a     Aaron Wilson, DOB: July 10, 1945  Phone: [phone] (home) [phone] (work)      All above HIPAA information was verified with patient.     Was a Nurse, learning disability used for this call? No    Completed refill call assessment today to schedule patient's medication shipment from the [facility] [phone]).       Specialty medication(s) and dose(s) confirmed: Regimen is correct and unchanged.   Changes to medications: [name] reports no changes at this time.  Changes to insurance: No  Questions for the pharmacist: No    Confirmed patient received Welcome Packet with first shipment. The patient will receive a drug information handout for each medication shipped and additional FDA Medication Guides as required.       DISEASE/MEDICATION-SPECIFIC INFORMATION        N/A    SPECIALTY MEDICATION ADHERENCE     Medication Adherence    Patient reported X missed doses in the last month: 0  Specialty Medication: Bexarotene 75 mg  Patient is on additional specialty medications: No  Patient is on more than two specialty medications: No  Any gaps in refill history greater than 2 weeks in the last 3 months: no  Demonstrates understanding of importance of adherence: yes  Informant: patient                Targretin 75mg : Patient has 14 days of medication on hand      SHIPPING     Shipping address confirmed in Epic.     Delivery Scheduled: Yes, Expected medication delivery date: 4/1.     Medication will be delivered via UPS to the prescription address in Epic WAM.    Aaron Wilson   [facility] Specialty Technician

## 2019-04-29 MED FILL — BEXAROTENE 75 MG CAPSULE: ORAL | 30 days supply | Qty: 120 | Fill #1

## 2019-04-29 MED FILL — BEXAROTENE 75 MG CAPSULE: 30 days supply | Qty: 120 | Fill #1 | Status: AC

## 2019-05-01 DIAGNOSIS — E781 Pure hyperglyceridemia: Principal | ICD-10-CM

## 2019-05-04 MED ORDER — ATORVASTATIN 40 MG TABLET
ORAL_TABLET | 0 refills | 0 days | Status: CP
Start: 2019-05-04 — End: ?

## 2019-05-14 ENCOUNTER — Ambulatory Visit: Admit: 2019-05-14 | Discharge: 2019-05-14 | Payer: MEDICARE | Attending: Dermatology | Primary: Dermatology

## 2019-05-14 ENCOUNTER — Ambulatory Visit: Admit: 2019-05-14 | Discharge: 2019-05-14 | Payer: MEDICARE

## 2019-05-14 ENCOUNTER — Other Ambulatory Visit: Admit: 2019-05-14 | Discharge: 2019-05-14 | Payer: MEDICARE

## 2019-05-14 DIAGNOSIS — E032 Hypothyroidism due to medicaments and other exogenous substances: Principal | ICD-10-CM

## 2019-05-14 DIAGNOSIS — E785 Hyperlipidemia, unspecified: Principal | ICD-10-CM

## 2019-05-14 DIAGNOSIS — C8400 Mycosis fungoides, unspecified site: Principal | ICD-10-CM

## 2019-05-14 LAB — EGFR CKD-EPI AA MALE: Glomerular filtration rate/1.73 sq M.predicted.black:ArVRat:Pt:Ser/Plas/Bld:Qn:Creatinine-based formula (CKD-EPI): >90

## 2019-05-14 LAB — CBC W/ AUTO DIFF
BASOPHILS ABSOLUTE COUNT: 0.1 10*9/L (ref 0.0–0.1)
BASOPHILS RELATIVE PERCENT: 1 %
EOSINOPHILS ABSOLUTE COUNT: 2.4 10*9/L — ABNORMAL HIGH (ref 0.0–0.4)
EOSINOPHILS RELATIVE PERCENT: 27.6 %
HEMATOCRIT: 43.5 % (ref 41.0–53.0)
LARGE UNSTAINED CELLS: 4 % (ref 0–4)
LYMPHOCYTES ABSOLUTE COUNT: 2.7 10*9/L (ref 1.5–5.0)
LYMPHOCYTES RELATIVE PERCENT: 30.6 %
MEAN CORPUSCULAR HEMOGLOBIN CONC: 29.9 g/dL — ABNORMAL LOW (ref 31.0–37.0)
MEAN CORPUSCULAR HEMOGLOBIN: 27.5 pg (ref 26.0–34.0)
MEAN CORPUSCULAR VOLUME: 91.9 fL (ref 80.0–100.0)
MEAN PLATELET VOLUME: 8.5 fL (ref 7.0–10.0)
MONOCYTES ABSOLUTE COUNT: 0.4 10*9/L (ref 0.2–0.8)
MONOCYTES RELATIVE PERCENT: 4.5 %
NEUTROPHILS ABSOLUTE COUNT: 2.8 10*9/L (ref 2.0–7.5)
NEUTROPHILS RELATIVE PERCENT: 32.1 %
PLATELET COUNT: 200 10*9/L (ref 150–440)
RED BLOOD CELL COUNT: 4.73 10*12/L (ref 4.50–5.90)
RED CELL DISTRIBUTION WIDTH: 14 % (ref 12.0–15.0)
WBC ADJUSTED: 8.8 10*9/L (ref 4.5–11.0)

## 2019-05-14 LAB — COMPREHENSIVE METABOLIC PANEL
ALKALINE PHOSPHATASE: 95 U/L (ref 38–126)
ALT (SGPT): 21 U/L (ref <50)
ANION GAP: 5 mmol/L — ABNORMAL LOW (ref 7–15)
AST (SGOT): 24 U/L (ref 19–55)
BILIRUBIN TOTAL: 0.2 mg/dL (ref 0.0–1.2)
BLOOD UREA NITROGEN: 15 mg/dL (ref 7–21)
BUN / CREAT RATIO: 18
CALCIUM: 9.7 mg/dL (ref 8.5–10.2)
CHLORIDE: 108 mmol/L — ABNORMAL HIGH (ref 98–107)
CO2: 31 mmol/L — ABNORMAL HIGH (ref 22.0–30.0)
CREATININE: 0.85 mg/dL (ref 0.70–1.30)
EGFR CKD-EPI AA MALE: >90 mL/min/{1.73_m2} (ref >=60)
GLUCOSE RANDOM: 100 mg/dL (ref 70–179)
POTASSIUM: 4.1 mmol/L (ref 3.5–5.0)
PROTEIN TOTAL: 7.1 g/dL (ref 6.5–8.3)
SODIUM: 144 mmol/L (ref 135–145)

## 2019-05-14 LAB — TRIGLYCERIDES: Triglyceride:MCnc:Pt:Ser/Plas:Qn:: 316 — ABNORMAL HIGH

## 2019-05-14 LAB — MONOCYTES RELATIVE PERCENT: Monocytes/100 leukocytes:NFr:Pt:Bld:Qn:Automated count: 4.5

## 2019-05-14 LAB — FREE T4: Thyroxine.free:MCnc:Pt:Ser/Plas:Qn:: 0.67 — ABNORMAL LOW

## 2019-05-14 MED ORDER — TRIAMCINOLONE ACETONIDE 0.1 % TOPICAL OINTMENT
Freq: Two times a day (BID) | TOPICAL | 3 refills | 0 days | Status: CP
Start: 2019-05-14 — End: 2020-05-13

## 2019-05-14 MED ORDER — CETIRIZINE 10 MG TABLET
ORAL_TABLET | Freq: Every day | ORAL | 5 refills | 30 days | Status: CP
Start: 2019-05-14 — End: 2020-05-13

## 2019-05-14 NOTE — Unmapped (Signed)
Labs drawn peripherally by Kyrstan Gotwalt.

## 2019-05-14 NOTE — Unmapped (Signed)
I saw and evaluated the patient, participating in the key portions of the service.  I reviewed the resident???s note -- Dr Jena Gauss.  I agree with the resident???s findings and plan.    Aaron Canterbury, MD          Dermatology Note     Assessment and Plan:      Mycosis Fungoides/Sezary, T3NxM0B2, with evidence of folliculotropic infiltrates in patch-plaque lesions as well as more infiltrative tumors and nodules most prominent on the bilateral upper extremities. PET scan showed diffuse adenopathy with minimal/moderate FDG avidity.  - skin finding stable to improved based on clinical photographs  - patient reports subjective improvement of symptoms on treatment. Will continue current therapy for now.   - continue bexarotene 300 mg po daily  - triamcinolone ointment prescribed today for involved areas on the ears, advised patient to let us know if unable to obtain from pharmacy due to cost   - Continue clobetasoL (TEMOVATE) 0.05 % ointment  - continue gabapentin 300mg  po qhs for pruritus, tolerating well  - ok to continue cetirizine and hydroxyzine for itch. Refilled cetirizine.   - Of note, patient did have monoclonal B cell lymphocytosis on flow cytometry but does not meet criteria for CLL at this point. Will continue to monitor  - Discussed assessment and plan with Dr Elmon Kirschner (oncology) and we are in agreement.    High risk medication use  -     CMP  -     Triglycerides  -     CBC w/ Differential  -     T4, Free  -  continue   levothyroxine (SYNTHROID) 100 MCG tablet; Take 1 tablet (100 mcg total) by mouth daily for drug induced hypothyroidism  -   continue  atorvastatin (LIPITOR) 40 MG tablet; Take 1 tablet (40 mg total) by mouth daily for hypertriglyceridemia      Risk of complications and/or morbidity and mortality associated with the management of MF/Sezary is considered high due to:  Prescription drug management and Drug therapy requiring intensive monitoring for toxicity    The patient was advised to call for an appointment should any new, changing ,or symptomatic lesions or a flare develop.   _____________________________________________________________________________________    Chief Complaint   Mycosis Fungoides/Sezary    HPI     Aaron Wilson is a pleasant 74 y.o. male, who presents as a follow up patient to follow up on Mycosis Fungoides/Sezary. He was last seen 12/2018 and at that time he was continued on bexarotene 450mg  po daily, topical steroids, gabapentin, cetirizine and hydroxyzine for pruritus. He was found to have low Free T4 and high TGs at 771. His synthroid was increased to , atorvastatin 40 was restarted and bexarotene was decreased temporarily to 300mg  po daily. Since LV, reports that his symptoms are improved. He reports improvement in his skin bumps on the arms and the back. Pruritus is well controlled. He is tolerating treatments well. Was unable to obtain topical medication for rash on the ears. He is s/p both covid vaccines.    Pertinent Past Medical History       Problem List        Musculoskeletal and Integument    Mycosis fungoides (CMS-HCC) - Primary     Diagnosis: Sezary/Mycosis Fungoides w/ folliculotropic infiltrates in patch-plaque lesions??as well as more infiltrative tumors and nodules most prominent on the bilateral upper extremities. PET scan showed diffuse adenopathy with minimal/moderate FDG avidity. CD30 stained scattered cells.  Stage: Stage IVA (  T3NxM0B2)  Regimen: 06/2018 - bexarotene 150mg /day and increased to 300mg /day on 08/14/18 and to 450mg  on 10/16/18, decreased to 300mg  daily because of elevated TG 12/20         Relevant Medications    triamcinolone (KENALOG) 0.1 % ointment    cetirizine (ZYRTEC) 10 MG tablet           Past Medical History, Family History, Social History , Med List, Allergies, Problem List reviewed in the rooming section of Epic     ROS: Other than symptoms mentioned in the HPI,   No fevers, chills, or other skin complaints.    Physical Examination General: Well-appearing male, in no acute distress, resting comfortably  Neuro: Alert and oriented, answers questions appropriately  Waist Up Skin Exam: Per patient request, Exam of the scalp, face, eyelids, lips, nose, ears, neck, chest, upper abdomen, back, arms, hands, and fingernails was performed.  Skin examination was notable for the following:  - skin colored papules and nodules scattered over the bl upper arms, nose, trunk  - innumerable folliculocentric skin colored to hyperpigmented papules coalescing into plaques on the back ad bl ears, improved from prior clinical photos   All areas not commented on are within normal limits or unremarkable.    The patient was seen and examined by Aaron Canterbury, MD who agrees with the assessment and plan as above.

## 2019-05-14 NOTE — Unmapped (Signed)
IDENTIFICATION: Aaron Wilson is a 74 y.o. male who presents as a referral from [place] in consultation for recommendations about work up and management of mycosis fungoides.  I have reviewed the outside records and interviewed the patient and my findings are summarized below.      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 PET scan showed diffuse adenopathy with minimal/moderate FDG avidity. CD30 stained scattered cells.  Stage: Stage Iva (T3NxM0B2)  Regimen: 06/2018 - bexarotene 150mg /day and increased to 300mg /day on 08/14/18 (went up to 450 for awhile, but his triglycerides increased so brought back to 300)    He still has significant disease but on exam there is some clearance on back and shrinkage of large tumor over his right elbow, and more importantly, he is happy with the current improvement and disease control. Therefore we will continue with the bexarotene at 300mg /day.    For pruritus, he will continue neurontin 300mg  qhs.    OTHER ISSUES:   1. Bilateral upper extremity weakness and numbness: At prior visit, patient notes new complaint of progressive weakness and numbness in his hands for the last 1-2 months, with objective weakness to confrontation strength testing in bilateral arms. He has a normal gait and no lower extremity symptoms to suggest neuropathy in these areas. Involvement of both arms raises concern for bilateral radiculopathy (though this seems less likely in the setting of subacute concurrent bilateral symptoms) or more central compressive process. Symptoms are unlikely to relate to his CTCL or therapy. He warrants C (and likely T) spine imaging, and we advised that he follow up closely with his PCP for further evaluation. This was not discussed further today.    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 He does have some LAD on his recent PET scan, but given current presentation I think this is most likely due to his MF rather than CLL. At this time there is no urgency to biopsy a node to confirm that it is not from CLL since, even if CLL were present, no treatment would be indicated at this time.    4. Hypertriglyceridemia: His Tg have been slowly increaseing since we started bexarotene but it is at an okay, if elevated level now.continue his statin. TG can increase risk of pancreatitis, typically if they get into the 800 or higher range.    Return to CTCL clinic in 3 months or sooner as needed.      =========================================    HISTORY OF PRESENT ILLNESS:  4 pills of targretin per day and he thinks his sking continues to improve. He is very satisfied with current level of skin control.   Got covid vaccine        MEDICATIONS:  Current Outpatient Medications   Medication Sig Dispense Refill   ??? aspirin (ECOTRIN) 81 MG tablet Take 81 mg by mouth once as needed.     ??? atorvastatin (LIPITOR) 40 MG tablet TAKE 1 TABLET BY MOUTH ONCE DAILY FOR CHOLESTEROL 90 tablet 0   ??? azithromycin (ZITHROMAX) 250 MG tablet      ??? betamethasone dipropionate (DIPROLENE) 0.05 % ointment Apply topically two (2)  times a day.     ??? bexarotene (TARGRETIN) 75 mg capsule Take 4 capsules (300 mg total) by mouth daily. 120 capsule 2   ??? cephalexin (KEFLEX) 500 MG capsule      ??? ciclopirox (LOPROX) 0.77 % cream Apply topically two (2) times a day as needed.     ??? clobetasoL (TEMOVATE) 0.05 % ointment Apply topically Two (2) times a day. As needed for itching 120 g 5   ??? gabapentin (NEURONTIN) 300 MG capsule Take 1 capsule (300 mg total) by mouth nightly. 30 capsule 11   ??? hydrOXYzine (ATARAX) 25 MG tablet Take 1 tablet (25 mg total) by mouth nightly. 30 tablet 5   ??? levothyroxine (SYNTHROID) 100 MCG tablet Take 1 tablet (100 mcg total) by mouth daily. 90 tablet 1   ??? metFORMIN (GLUCOPHAGE) 500 MG tablet Take 500 mg by mouth two (2) times a day.     ??? omega-3 acid ethyl esters (LOVAZA) 1 gram capsule Take 1 capsule by mouth two (2) times a day.     ??? pimecrolimus (ELIDEL) 1 % cream      ??? tamsulosin (FLOMAX) 0.4 mg capsule Take 0.4 mg by mouth nightly.     ??? traZODone (DESYREL) 100 MG tablet      ??? triamcinolone (KENALOG) 0.1 % cream Apply topically Two (2) times a day. 30 g 1   ??? zolpidem (AMBIEN) 5 MG tablet      ??? cetirizine (ZYRTEC) 10 MG tablet Take 1 tablet (10 mg total) by mouth daily. 30 tablet 5   ??? triamcinolone (KENALOG) 0.1 % ointment Apply topically Two (2) times a day. As needed 30 g 3     No current facility-administered medications for this visit.         VITAL SIGNS:   Vitals:    05/14/19 1609   BP: 135/79   Pulse: 71   Resp: 16   Temp: 36.1 ??C (96.9 ??F)   TempSrc: Temporal   SpO2: 100%   Weight: 89.6 kg (197 lb 8 oz)       EXAM:  CONST: NAD, Awake, Alert  HEENT: Oropharynx clear.   LYMPH: No cervical, supraclavicular, axillary, or inguinal LAD  RESP: Clear to auscultation bilaterally.  CV: Regular rate and rhythm. No rubs, gallops or murmurs.   GI: Soft, nontender, nondistended. No hepatosplenomegaly.   MSK: No edema.  Skin: Several erythematous folliculocentric papules, plaques and nodules with overlying scale and crust most predominantly on the bilateral upper extremities (L>R) but also involving the ears and back, with some definite clearing of back since last visit and decreased size of the right elbow tumor.        LABORATORY:  Lab on 05/14/2019   Component Date Value Ref Range Status   ??? Sodium 05/14/2019 144  135 - 145 mmol/L Final   ??? Potassium 05/14/2019 4.1  3.5 - 5.0 mmol/L Final   ??? Chloride 05/14/2019 108* 98 - 107 mmol/L Final   ??? Anion Gap 05/14/2019 5* 7 - 15 mmol/L Final   ??? CO2 05/14/2019 31.0* 22.0 - 30.0 mmol/L Final   ??? BUN 05/14/2019 15  7 - 21 mg/dL Final   ??? Creatinine 05/14/2019 0.85  0.70 - 1.30 mg/dL Final   ??? BUN/Creatinine Ratio 05/14/2019 18   Final   ??? EGFR CKD-EPI Non-African American,* 05/14/2019 87  >=60 mL/min/1.60m2 Final   ??? EGFR CKD-EPI African American, Male 05/14/2019 >90  >=60 mL/min/1.30m2 Final   ??? Glucose 05/14/2019 100  70 - 179 mg/dL Final   ??? Calcium 16/10/9602 9.7  8.5 - 10.2 mg/dL Final   ??? Albumin 54/09/8117 4.2  3.5 - 5.0 g/dL Final   ??? Total Protein 05/14/2019 7.1  6.5 - 8.3 g/dL Final   ??? Total Bilirubin 05/14/2019 0.2  0.0 - 1.2 mg/dL Final   ??? AST 14/78/2956 24  19 - 55 U/L Final   ??? ALT 05/14/2019 21  <50 U/L Final   ??? Alkaline Phosphatase 05/14/2019 95  38 - 126 U/L Final   ??? Free T4 05/14/2019 0.67* 0.71 - 1.40 ng/dL Final   ??? Triglycerides 05/14/2019 316* 1 - 149 mg/dL Final   ??? WBC 21/30/8657 8.8  4.5 - 11.0 10*9/L Final   ??? RBC 05/14/2019 4.73  4.50 - 5.90 10*12/L Final   ??? HGB 05/14/2019 13.0* 13.5 - 17.5 g/dL Final   ??? HCT 84/69/6295 43.5  41.0 - 53.0 % Final   ??? MCV 05/14/2019 91.9  80.0 - 100.0 fL Final   ??? MCH 05/14/2019 27.5  26.0 - 34.0 pg Final   ??? MCHC 05/14/2019 29.9* 31.0 - 37.0 g/dL Final   ??? RDW 28/41/3244 14.0  12.0 - 15.0 % Final   ??? MPV 05/14/2019 8.5  7.0 - 10.0 fL Final   ??? Platelet 05/14/2019 200  150 - 440 10*9/L Final   ??? Neutrophils % 05/14/2019 32.1  % Final   ??? Lymphocytes % 05/14/2019 30.6  % Final   ??? Monocytes % 05/14/2019 4.5  % Final   ??? Eosinophils % 05/14/2019 27.6  % Final   ??? Basophils % 05/14/2019 1.0  % Final   ??? Absolute Neutrophils 05/14/2019 2.8  2.0 - 7.5 10*9/L Final   ??? Absolute Lymphocytes 05/14/2019 2.7  1.5 - 5.0 10*9/L Final   ??? Absolute Monocytes 05/14/2019 0.4  0.2 - 0.8 10*9/L Final   ??? Absolute Eosinophils 05/14/2019 2.4* 0.0 - 0.4 10*9/L Final   ??? Absolute Basophils 05/14/2019 0.1  0.0 - 0.1 10*9/L Final   ??? Large Unstained Cells 05/14/2019 4  0 - 4 % Final   ??? Hypochromasia 05/14/2019 Moderate* Not Present Final

## 2019-05-15 ENCOUNTER — Encounter (HOSPITAL_COMMUNITY): Payer: Self-pay

## 2019-05-15 ENCOUNTER — Ambulatory Visit (HOSPITAL_COMMUNITY): Payer: Medicare (Managed Care)

## 2019-05-18 NOTE — Unmapped (Signed)
I spoke with patient Aaron Wilson to confirm appointments on the following date(s): 08/13/2019.  Pt. Confirmed date and times.    Danford Bad

## 2019-05-21 NOTE — Unmapped (Signed)
Return t/c to patient. Let him know per Dr. Elmon Kirschner, his labs look good. His thyroid level is still a little low, but we don't need to change his Synthroid dose yet. Triglycerides have improved compared to prior. We will continue to monitor his labs at next visit. Patient was appreciative of call back.

## 2019-05-22 NOTE — Unmapped (Signed)
[facility] Refill Coordination Note    Specialty Medication(s) to be Shipped:   Hematology/Oncology: Targretin    Other medication(s) to be shipped: n/a     Aaron Wilson, DOB: 10-06-1945  Phone: [phone] (home) [phone] (work)      All above HIPAA information was verified with patient.     Was a Nurse, learning disability used for this call? No    Completed refill call assessment today to schedule patient's medication shipment from the [facility] [phone]).       Specialty medication(s) and dose(s) confirmed: Regimen is correct and unchanged.   Changes to medications: Aaron Wilson reports no changes at this time.  Changes to insurance: No  Questions for the pharmacist: No    Confirmed patient received Welcome Packet with first shipment. The patient will receive a drug information handout for each medication shipped and additional FDA Medication Guides as required.       DISEASE/MEDICATION-SPECIFIC INFORMATION        N/A    SPECIALTY MEDICATION ADHERENCE     Medication Adherence    Patient reported X missed doses in the last month: 0  Specialty Medication: Bexarotene 75 mg  Patient is on additional specialty medications: No  Any gaps in refill history greater than 2 weeks in the last 3 months: no  Demonstrates understanding of importance of adherence: yes  Informant: patient  Reliability of informant: reliable  Confirmed plan for next specialty medication refill: delivery by pharmacy  Refills needed for supportive medications: not needed                Targretin 75mg : Patient has 14 days of medication on hand      SHIPPING     Shipping address confirmed in Epic.     Delivery Scheduled: Yes, Expected medication delivery date: 05/27/2019.     Medication will be delivered via UPS to the prescription address in Epic WAM.    Aaron Wilson   [facility] Shared [facility] Specialty Technician

## 2019-05-26 MED FILL — BEXAROTENE 75 MG CAPSULE: 30 days supply | Qty: 120 | Fill #2 | Status: AC

## 2019-05-26 MED FILL — BEXAROTENE 75 MG CAPSULE: ORAL | 30 days supply | Qty: 120 | Fill #2

## 2019-06-10 ENCOUNTER — Ambulatory Visit (HOSPITAL_COMMUNITY): Payer: BC Managed Care – PPO

## 2019-06-17 DIAGNOSIS — C8400 Mycosis fungoides, unspecified site: Principal | ICD-10-CM

## 2019-06-17 MED ORDER — CLOBETASOL 0.05 % TOPICAL OINTMENT
Freq: Two times a day (BID) | TOPICAL | 0 refills | 0 days | Status: CP
Start: 2019-06-17 — End: ?
  Filled 2019-07-06: qty 60, 30d supply, fill #0

## 2019-06-17 MED ORDER — BEXAROTENE 75 MG CAPSULE
ORAL_CAPSULE | Freq: Every day | ORAL | 1 refills | 30.00000 days | Status: CP
Start: 2019-06-17 — End: ?
  Filled 2019-06-24: qty 120, 30d supply, fill #0

## 2019-06-17 NOTE — Unmapped (Signed)
Aaron Wilson reports things are stable with his bexarotene. He had a recent visit and the plan is to continue 300 mg/daily.    He initially told me he had plenty of medication and didn't need a refill - when I asked further questions, he denied missed doses and wasn't sure of his current supply. Will continue to track to ensure no issues with adherence. Medication is expensive, and want to make sure this doesn't impact his ability to take (is using [employer] account and making payments).    He reports the triamcinolone hasn't helped his ear itching despite using BID for past month. I did confirm with local pharmacy it was picked up around 4/17. I'll see if his team has any other thoughts that might help.     [facility] Clinical Assessment & Refill Coordination Note    Aaron Wilson, DOB: August 13, 1945  Phone: [phone] (home) [phone] (work)    All above HIPAA information was verified with patient.     Was a Nurse, learning disability used for this call? No    Specialty Medication(s):   Hematology/Oncology: bexarotene     Current Outpatient Medications   Medication Sig Dispense Refill   ??? aspirin (ECOTRIN) 81 MG tablet Take 81 mg by mouth once as needed.     ??? atorvastatin (LIPITOR) 40 MG tablet TAKE 1 TABLET BY MOUTH ONCE DAILY FOR CHOLESTEROL 90 tablet 0   ??? azithromycin (ZITHROMAX) 250 MG tablet      ??? betamethasone dipropionate (DIPROLENE) 0.05 % ointment Apply topically two (2) times a day.     ??? bexarotene (TARGRETIN) 75 mg capsule Take 4 capsules (300 mg total) by mouth daily. 120 capsule 2   ??? cephalexin (KEFLEX) 500 MG capsule      ??? cetirizine (ZYRTEC) 10 MG tablet Take 1 tablet (10 mg total) by mouth daily. 30 tablet 5   ??? ciclopirox (LOPROX) 0.77 % cream Apply topically two (2) times a day as needed.     ??? clobetasoL (TEMOVATE) 0.05 % ointment Apply topically Two (2) times a day. As needed for itching 120 g 5   ??? gabapentin (NEURONTIN) 300 MG capsule Take 1 capsule (300 mg total) by mouth nightly. 30 capsule 11   ??? hydrOXYzine (ATARAX) 25 MG tablet Take 1 tablet (25 mg total) by mouth nightly. 30 tablet 5   ??? levothyroxine (SYNTHROID) 100 MCG tablet Take 1 tablet (100 mcg total) by mouth daily. 90 tablet 1   ??? metFORMIN (GLUCOPHAGE) 500 MG tablet Take 500 mg by mouth two (2) times a day.     ??? omega-3 acid ethyl esters (LOVAZA) 1 gram capsule Take 1 capsule by mouth two (2) times a day.     ??? pimecrolimus (ELIDEL) 1 % cream      ??? tamsulosin (FLOMAX) 0.4 mg capsule Take 0.4 mg by mouth nightly.     ??? traZODone (DESYREL) 100 MG tablet      ??? triamcinolone (KENALOG) 0.1 % cream Apply topically Two (2) times a day. 30 g 1   ??? triamcinolone (KENALOG) 0.1 % ointment Apply topically Two (2) times a day. As needed 30 g 3   ??? zolpidem (AMBIEN) 5 MG tablet        No current facility-administered medications for this visit.        Changes to medications: Zenas reports no changes at this time.    No Known Allergies    Changes to allergies: No    SPECIALTY MEDICATION ADHERENCE  bexarotene ~ 1 week left     Medication Adherence    Patient reported X missed doses in the last month: 0  Specialty Medication: Bexarotene  Patient is on additional specialty medications: No          Specialty medication(s) dose(s) confirmed: Regimen is correct and unchanged.     Are there any concerns with adherence? No    Adherence counseling provided? Not needed    CLINICAL MANAGEMENT AND INTERVENTION      Clinical Benefit Assessment:    Do you feel the medicine is effective or helping your condition? Yes    Clinical Benefit counseling provided? Not needed    Adverse Effects Assessment:    Are you experiencing any side effects? No    Are you experiencing difficulty administering your medicine? No    Quality of Life Assessment:    How many days over the past month did your Sezary/MF  keep you from your normal activities? For example, brushing your teeth or getting up in the morning. 0    Have you discussed this with your provider? Not needed    Therapy Appropriateness:    Is therapy appropriate? Yes, therapy is appropriate and should be continued    DISEASE/MEDICATION-SPECIFIC INFORMATION      N/A    PATIENT SPECIFIC NEEDS     - Does the patient have any physical, cognitive, or cultural barriers? No    - Is the patient high risk? Yes, patient is taking oral chemotherapy. Appropriateness of therapy as been assessed.     - Does the patient require a Care Management Plan? No     - Does the patient require physician intervention or other additional services (i.e. nutrition, smoking cessation, social work)? No      SHIPPING     Specialty Medication(s) to be Shipped:   Hematology/Oncology: bexarotene    Other medication(s) to be shipped: na     Changes to insurance: No    Delivery Scheduled: Yes, Expected medication delivery date: Thurs, May 27.     Medication will be delivered via UPS to the confirmed prescription address in [facility].    The patient will receive a drug information handout for each medication shipped and additional FDA Medication Guides as required.  Verified that patient has previously received a Conservation officer, historic buildings.    All of the patient's questions and concerns have been addressed.    Lanney Gins   [facility] Shared [facility] Specialty Pharmacist

## 2019-06-17 NOTE — Unmapped (Signed)
Addended by: [name] on: 06/17/2019 04:45 PM     Modules accepted: Orders

## 2019-06-24 MED FILL — BEXAROTENE 75 MG CAPSULE: 30 days supply | Qty: 120 | Fill #0 | Status: AC

## 2019-06-30 ENCOUNTER — Ambulatory Visit (HOSPITAL_COMMUNITY): Payer: BC Managed Care – PPO

## 2019-07-06 MED FILL — CLOBETASOL 0.05 % TOPICAL OINTMENT: 30 days supply | Qty: 60 | Fill #0 | Status: AC

## 2019-07-13 ENCOUNTER — Ambulatory Visit (HOSPITAL_COMMUNITY): Payer: BC Managed Care – PPO

## 2019-07-15 MED ORDER — IBUPROFEN 600 MG TABLET
0 days
Start: 2019-07-15 — End: ?

## 2019-07-17 NOTE — Unmapped (Signed)
Patient states he has 1 full bottle on hand. Rescheduling call.

## 2019-07-30 ENCOUNTER — Ambulatory Visit (HOSPITAL_COMMUNITY): Payer: Medicare (Managed Care)

## 2019-08-04 NOTE — Unmapped (Signed)
[facility] Refill Coordination Note    Specialty Medication(s) to be Shipped:   Inflammatory Disorders: Bexarotene 75mg     Other medication(s) to be shipped: none     Aaron Wilson, DOB: 05/17/1945  Phone: [phone] (home) [phone] (work)      All above HIPAA information was verified with patient.     Was a Nurse, learning disability used for this call? No    Completed refill call assessment today to schedule patient's medication shipment from the [facility] [phone]).       Specialty medication(s) and dose(s) confirmed: Regimen is correct and unchanged.   Changes to medications: Aaron Wilson reports no changes at this time.  Changes to insurance: No  Questions for the pharmacist: No    Confirmed patient received Welcome Packet with first shipment. The patient will receive a drug information handout for each medication shipped and additional FDA Medication Guides as required.       DISEASE/MEDICATION-SPECIFIC INFORMATION        N/A    SPECIALTY MEDICATION ADHERENCE     Medication Adherence    Patient reported X missed doses in the last month: 0  Specialty Medication: Bexarotone 75mg   Patient is on additional specialty medications: No                bexarotene 75 mg: 7 days of medicine on hand          SHIPPING     Shipping address confirmed in Epic.     Delivery Scheduled: Yes, Expected medication delivery date: 08/07/19.     Medication will be delivered via UPS to the prescription address in Epic WAM.    Unk Lightning   [facility] Specialty Technician

## 2019-08-06 DIAGNOSIS — C8400 Mycosis fungoides, unspecified site: Principal | ICD-10-CM

## 2019-08-06 MED ORDER — BEXAROTENE 75 MG CAPSULE
ORAL_CAPSULE | Freq: Every day | ORAL | 1 refills | 30.00000 days | Status: CP
Start: 2019-08-06 — End: ?

## 2019-08-06 NOTE — Unmapped (Signed)
The [facility] has received the prescription(s) for Bexarotene. The triage team has completed the benefits investigation and has determined that the patient is NOT able to fill this medication at the [facility] due to insurance plan limitations. Please see additional information below and re-route the prescription to the preferred pharmacy. Thank you.    PA Required: No  Specialty Pharmacy Required: [facility] -- Phone: [phone] and Fax: [phone]     **This medication was scheduled to ship to the patient today for delivery tomorrow.**

## 2019-08-06 NOTE — Unmapped (Signed)
Addended by: [name] on: 08/06/2019 11:09 AM     Modules accepted: Orders

## 2019-08-06 NOTE — Unmapped (Signed)
Aaron Wilson 's Bexarotene shipment will be canceled  as a result of no longer being eligible to fill at [facility] pharmacy.     I have reached out to the patient and communicated the delivery change. We will not reschedule the medication due to patient must fill at [facility]. and have removed this/these medication(s) from the work request.  We have canceled this work request.

## 2019-08-11 NOTE — Unmapped (Addendum)
Update: per derm note 7/15, the plan will be to stop bexarotene, so no need to f/u on patient receiving this at this point. Will remove from [facility] list.    This patient has been disenrolled from the [facility] specialty pharmacy services due to a change in therapy. The patient is now taking gemcitabine and is not filling at the [facility].    Lanney Gins  [facility] Shared [employer] Specialty Pharmacist      [facility] Pharmacist Intervention    Type of intervention: care coordination/drug access    Medication: Bexarotene    Problem: [employer] insurance plan now requires [facility] Odis Luster sent a new order to them on 7/8.     Intervention: I called [employer] to follow up. He [facility] not yet [facility]. He reported he was on hold with them for ~ 3 hours and then a rep told him they'd call back. It's unclear what the issue may be, but I'll follow up with Fayrene Fearing in a few more days to make sure he has received his medication.    He reports having half a bottle for now.    Follow up needed: yes, move check in call.     Approximate time spent: 6 minutes    Bassem Bernasconi A Desiree Lucy Shared [facility] Specialty Pharmacist

## 2019-08-13 ENCOUNTER — Other Ambulatory Visit: Admit: 2019-08-13 | Discharge: 2019-08-13 | Payer: MEDICARE

## 2019-08-13 ENCOUNTER — Ambulatory Visit: Admit: 2019-08-13 | Discharge: 2019-08-13 | Payer: MEDICARE

## 2019-08-13 ENCOUNTER — Ambulatory Visit: Admit: 2019-08-13 | Discharge: 2019-08-13 | Payer: MEDICARE | Attending: Dermatology | Primary: Dermatology

## 2019-08-13 DIAGNOSIS — C8400 Mycosis fungoides, unspecified site: Principal | ICD-10-CM

## 2019-08-13 DIAGNOSIS — E785 Hyperlipidemia, unspecified: Principal | ICD-10-CM

## 2019-08-13 DIAGNOSIS — E032 Hypothyroidism due to medicaments and other exogenous substances: Principal | ICD-10-CM

## 2019-08-13 LAB — CBC W/ AUTO DIFF
BASOPHILS ABSOLUTE COUNT: 0.1 10*9/L (ref 0.0–0.1)
BASOPHILS RELATIVE PERCENT: 1.1 %
EOSINOPHILS RELATIVE PERCENT: 38.8 %
HEMATOCRIT: 39.2 % — ABNORMAL LOW (ref 41.0–53.0)
HEMOGLOBIN: 12.4 g/dL — ABNORMAL LOW (ref 13.5–17.5)
LARGE UNSTAINED CELLS: 3 % (ref 0–4)
LYMPHOCYTES ABSOLUTE COUNT: 3.1 10*9/L (ref 1.5–5.0)
LYMPHOCYTES RELATIVE PERCENT: 27.4 %
MEAN CORPUSCULAR HEMOGLOBIN CONC: 31.5 g/dL (ref 31.0–37.0)
MEAN CORPUSCULAR HEMOGLOBIN: 29 pg (ref 26.0–34.0)
MEAN CORPUSCULAR VOLUME: 91.9 fL (ref 80.0–100.0)
MEAN PLATELET VOLUME: 8.3 fL (ref 7.0–10.0)
MONOCYTES ABSOLUTE COUNT: 0.5 10*9/L (ref 0.2–0.8)
MONOCYTES RELATIVE PERCENT: 4 %
NEUTROPHILS ABSOLUTE COUNT: 2.9 10*9/L (ref 2.0–7.5)
NEUTROPHILS RELATIVE PERCENT: 26.3 %
PLATELET COUNT: 231 10*9/L (ref 150–440)
RED BLOOD CELL COUNT: 4.27 10*12/L — ABNORMAL LOW (ref 4.50–5.90)
RED CELL DISTRIBUTION WIDTH: 13.5 % (ref 12.0–15.0)
WBC ADJUSTED: 11.2 10*9/L — ABNORMAL HIGH (ref 4.5–11.0)

## 2019-08-13 LAB — MEAN PLATELET VOLUME: Platelet mean volume:EntVol:Pt:Bld:Qn:Automated count: 8.3

## 2019-08-13 LAB — ALT (SGPT): Alanine aminotransferase:CCnc:Pt:Ser/Plas:Qn:: 25

## 2019-08-13 LAB — FREE T4: Thyroxine.free:MCnc:Pt:Ser/Plas:Qn:: 0.8 — ABNORMAL LOW

## 2019-08-13 LAB — TRIGLYCERIDES: Triglyceride:MCnc:Pt:Ser/Plas:Qn:: 285 — ABNORMAL HIGH

## 2019-08-13 NOTE — Unmapped (Unsigned)
Getting an mri of the neck  Having pain in left hand    Lesions on arms getting bigger  Discussed switching to gemcitabine      [facility] -- Dermatology Note     Assessment and Plan:      Mycosis Fungoides/Sezary, T3NxM0B2, with evidence of folliculotropic infiltrates in patch-plaque lesions as well as more infiltrative tumors and nodules most prominent on the bilateral upper extremities. PET scan showed diffuse adenopathy with minimal/moderate FDG avidity.  - Patient has been tolerating bexarotene 300mg  daily well, and does seem to have had some improvement in the itching on his back and in some of the smaller papules on his arms and back. However, several of the tumors on his arm (L>R) are noticeably bigger than when we started therapy.   - Labs today are acceptable, TG slightly elevated in the 200s, free T4 slightly low but improved from prior  - Discussed options of continuing bexarotene versus changing therapy. Agreed that a trial of gemcitabine is reasonable -- Dr Elmon Kirschner will set this up. Discussed possible side effects and expectations with this.   - Patient advised to stop bexarotene at the time that he starts the gemcitabine. He will continue levothyroxine for 2 weeks after stopping and then he will stop that as well.     The patient was advised to call for an appointment should any new, changing, or symptomatic lesions develop.     RTC: w/ Dr Elmon Kirschner for gemcitabine initiation  _________________________________________________________________      Chief Complaint     No chief complaint on file.      HPI     Aaron Wilson is a 74 y.o. male who presents as a {Derm pt type:71410} [facility] for {reason for visit:75513} ***    The patient denies any other new or changing lesions or areas of concern.     Pertinent Past Medical History     {derm PMH :75456}    Problem List     None        ***    Family History:   {derm skin check melanoma history:75452}    Past Medical History, Family History, Social History, Medication List, Allergies, and Problem List were reviewed in the rooming section of Epic.     ROS: Other than symptoms mentioned in the HPI, {derm skin check ros:75408::no fevers, chills, or other skin complaints}    Physical Examination     GENERAL: Well-appearing Fitzpatrick skin type {Fitzpatrick skin type numbers:75512} male in no acute distress, resting comfortably.  {PE systems:75418::NEURO: Alert and oriented, answers questions appropriately}  {PE extent:75514} was performed  {PE list:75421}  ***    {PE limitations:75419::All areas not commented on are within normal limits or unremarkable}      (Approved Template 06/19/2019)

## 2019-08-13 NOTE — Unmapped (Unsigned)
Labs drawn and sent for analysis. Care provided by [facility] RN

## 2019-08-13 NOTE — Unmapped (Signed)
Dr Elmon Kirschner will set you up for the new medication (gemcitabine).  Please stop the bexarotene when you start this new medication.   Continue the levothyroxine (thyroid pill) for 2 weeks after stopping bexarotene but then stop this as well.

## 2019-08-13 NOTE — Unmapped (Signed)
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 PET scan showed diffuse adenopathy with minimal/moderate FDG avidity. CD30 stained scattered cells.  Stage: Stage Iva (T3NxM0B2)  Regimen: 06/2018 - bexarotene 150mg /day and increased to 300mg /day on 08/14/18 (went up to 450 for awhile, but his triglycerides increased so brought back to 300)  -Gemcitabine to start 08/2019    His disease control with bexarotene has never been great but I think there has been a little disease progression and he is not longer satisfied with current control.  Therefore we will stop the bexarotene and start gemcitabine when he returns in two weeks. Plan to continue for around 6 cycles if he is responding well.    Gemcitabine  Gemcitabine is a pyrimidine antimetabolite that inhibits DNA synthesis by blocking DNA polymerase and ribonucleotide reductase.  Zinzani et al., conducted  phase II clinical trial of single-agent gemcitabine in patients with relapsed or refractory (R/R) CTCls, of which 30 patients had MF.  In patients with MF, the CR rate (CRR) was 10% and the partial response rate (PRR) was 60%. For patients who obtained a CR, responses were ongoing at 6-22 months. Those with partial responses (PR) had a median duration of response ranging from 6-10 months.  A long-term updated analysis of 39 patients with R/R T-cell lymphoma treated with single-agent gemcitabine, demonstrated an ORR of 48% and a CRR of 16%, in the 19 patients who had MF.  In the three patients with MF who developed CRs, responses were durable with remissions lasting 10, 18, and 120 months.  Lastly, Duvic et al., conducted a phase II clinical trial of single-agent gemcitabine in 33 patients with R/R cutaneousT-cell lymphomas, of which 8 patients were treated off protoco. Thirty-one patients in the study had the diagnosis of MF, with 28 having advanced stage disease.  The ORR for protocol patients was 68%, with a CRR of 12%.  The median duration of response for protocol patients achieving a PR was 4.1 months.  The estimated median 3-year overall survival was 20.4 month. Thus, single-agent gemcitabine can be effective in some patients, and, although rare, it can produce long term remissions.     OTHER ISSUES:   1. Bilateral upper extremity weakness and numbness: At prior visit, patient notes new complaint of progressive weakness and numbness in his hands for the last 1-2 months, with objective weakness to confrontation strength testing in bilateral arms. He has a normal gait and no lower extremity symptoms to suggest neuropathy in these areas. Involvement of both arms raises concern for bilateral radiculopathy (though this seems less likely in the setting of subacute concurrent bilateral symptoms) or more central compressive process. Symptoms are unlikely to relate to his CTCL or therapy. He warrants C (and likely T) spine imaging, and we advised that he follow up closely with his PCP for further evaluation. This was not discussed further today.  He has an MRI scan scheduled for sometime in the next few weeks.    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 He does have some LAD on his recent PET scan, but given current presentation I think this is most likely due to his MF  rather than CLL. At this time there is no urgency to biopsy a node to confirm that it is not from CLL since, even if CLL were present, no treatment would be indicated at this time.    4. Hypertriglyceridemia: His Tg have been slowly increaseing since we started bexarotene but it is at an okay, if elevated level now.continue his statin. TG can increase risk of pancreatitis, typically if they get into the 800 or higher range.  May be able to stop the statin once the bexarotene is stopped.    5. He got the covid vaccine    Recommendations discussed with Dr. Odis Luster and she is in agreement with the plan.    Clarene Critchley, MD  Associate Professor  Division of Hematology        =========================================    HISTORY OF PRESENT ILLNESS:      He still has trouble with his hands and weakness and he is set up for an MRI scan in a few weeks to evaluate more fully.     He think the skin is stable to lightly worse and he is interested in trying a different therapy.      MEDICATIONS:  Current Outpatient Medications   Medication Sig Dispense Refill   ??? aspirin (ECOTRIN) 81 MG tablet Take 81 mg by mouth once as needed.     ??? atorvastatin (LIPITOR) 40 MG tablet TAKE 1 TABLET BY MOUTH ONCE DAILY FOR CHOLESTEROL 90 tablet 0   ??? bexarotene (TARGRETIN) 75 mg capsule Take 4 capsules (300 mg total) by mouth daily. 120 capsule 1   ??? cetirizine (ZYRTEC) 10 MG tablet Take 1 tablet (10 mg total) by mouth daily. 30 tablet 5   ??? clobetasoL (TEMOVATE) 0.05 % ointment Apply topically to the affected area(s) twice daily as needed for itching. 60 g 0   ??? gabapentin (NEURONTIN) 300 MG capsule Take 1 capsule (300 mg total) by mouth nightly. 30 capsule 11   ??? hydrOXYzine (ATARAX) 25 MG tablet Take 1 tablet (25 mg total) by mouth nightly. 30 tablet 5   ??? levothyroxine (SYNTHROID) 100 MCG tablet Take 1 tablet (100 mcg total) by mouth daily. 90 tablet 1   ??? tamsulosin (FLOMAX) 0.4 mg capsule Take 0.4 mg by mouth nightly.     ??? traZODone (DESYREL) 100 MG tablet      ??? zolpidem (AMBIEN) 5 MG tablet      ??? azithromycin (ZITHROMAX) 250 MG tablet  (Patient not taking: Reported on 08/13/2019)     ??? betamethasone dipropionate (DIPROLENE) 0.05 % ointment Apply topically two (2) times a day. (Patient not taking: Reported on 08/13/2019)     ??? cephalexin (KEFLEX) 500 MG capsule  (Patient not taking: Reported on 08/13/2019)     ??? ciclopirox (LOPROX) 0.77 % cream Apply topically two (2) times a day as needed. (Patient not taking: Reported on 08/13/2019)     ??? metFORMIN (GLUCOPHAGE) 500 MG tablet Take 500 mg by mouth two (2) times a day. (Patient not taking: Reported on 08/13/2019)     ??? omega-3 acid ethyl esters (LOVAZA) 1 gram capsule Take 1 capsule by mouth two (2) times a day. (Patient not taking: Reported on 08/13/2019)     ??? pimecrolimus (ELIDEL) 1 % cream  (Patient not taking: Reported on 08/13/2019)     ??? triamcinolone (KENALOG) 0.1 % cream Apply topically Two (2) times a day. (Patient not taking: Reported on 08/13/2019) 30 g 1   ??? triamcinolone (KENALOG) 0.1 %  ointment Apply topically Two (2) times a day. As needed (Patient not taking: Reported on 08/13/2019) 30 g 3     No current facility-administered medications for this visit.         VITAL SIGNS:   Vitals:    08/13/19 1540   BP: 123/73   Pulse: 72   Resp: 16   Temp: 36.6 ??C (97.9 ??F)   TempSrc: Oral   SpO2: 99%   Weight: 89.9 kg (198 lb 3.2 oz)   Height: 167.6 cm (5' 5.98)       EXAM:  CONST: NAD, Awake, Alert  LYMPH: No cervical, supraclavicular, axillary, or inguinal LAD  RESP: Clear to auscultation bilaterally.  CV: Regular rate and rhythm. No rubs, gallops or murmurs.   GI: Soft, nontender, nondistended. No hepatosplenomegaly.   MSK: No edema.  Skin: Several erythematous folliculocentric papules, plaques and nodules with overlying scale and crust most predominantly on the bilateral upper extremities (L>R) but also involving the ears and back, the tumors have progressed slighty on his extremities and he has new lesions on his abdominal wall                                LABORATORY:  Lab on 08/13/2019   Component Date Value Ref Range Status   ??? ALT 08/13/2019 25  10 - 49 U/L Final   ??? Free T4 08/13/2019 0.80* 0.89 - 1.76 ng/dL Final   ??? Triglycerides 08/13/2019 285* 0 - 150 mg/dL Final   ??? WBC 16/10/9602 11.2* 4.5 - 11.0 10*9/L Final   ??? RBC 08/13/2019 4.27* 4.50 - 5.90 10*12/L Final   ??? HGB 08/13/2019 12.4* 13.5 - 17.5 g/dL Final   ??? HCT 54/09/8117 39.2* 41.0 - 53.0 % Final   ??? MCV 08/13/2019 91.9  80.0 - 100.0 fL Final   ??? MCH 08/13/2019 29.0  26.0 - 34.0 pg Final   ??? MCHC 08/13/2019 31.5  31.0 - 37.0 g/dL Final   ??? RDW 14/78/2956 13.5  12.0 - 15.0 % Final   ??? MPV 08/13/2019 8.3  7.0 - 10.0 fL Final   ??? Platelet 08/13/2019 231  150 - 440 10*9/L Final   ??? Neutrophils % 08/13/2019 26.3  % Final   ??? Lymphocytes % 08/13/2019 27.4  % Final   ??? Monocytes % 08/13/2019 4.0  % Final   ??? Eosinophils % 08/13/2019 38.8  % Final   ??? Basophils % 08/13/2019 1.1  % Final   ??? Absolute Neutrophils 08/13/2019 2.9  2.0 - 7.5 10*9/L Final   ??? Absolute Lymphocytes 08/13/2019 3.1  1.5 - 5.0 10*9/L Final   ??? Absolute Monocytes 08/13/2019 0.5  0.2 - 0.8 10*9/L Final   ??? Absolute Eosinophils 08/13/2019 4.3* 0.0 - 0.4 10*9/L Final   ??? Absolute Basophils 08/13/2019 0.1  0.0 - 0.1 10*9/L Final   ??? Large Unstained Cells 08/13/2019 3  0 - 4 % Final   ??? Hypochromasia 08/13/2019 Moderate* Not Present Final

## 2019-08-13 NOTE — Unmapped (Signed)
Stop the capsules the day before we start the new medicine    Stop they thyroid medicine about 2 weeks after stopping the capsules and then stop     The new medicine si called gemcitabine. It is given through an IV - once a week for three weeks in a row followed by a one week break. Then we do it all again - if it is helping then we might continue for up to around 6 months.    It is generally pretty well tolerated.

## 2019-08-19 NOTE — Unmapped (Signed)
Called patient to schd an appt he stated that he saw Dr. Odis Luster last week with another Dr at the cancer clinic on 7.15.2021 he also saw her in April. Patient stated that he sees Dr. Derryl Harbor at the cancer clinic now.    Dr. Odis Luster,    Please advise, would patient need to come into Derm for PA to have medication refill or is he ok since you seen him at the cancer clinic     Thanks  [facility]

## 2019-08-19 NOTE — Unmapped (Signed)
PA required for Bexarotene.  Dr. Odis Luster last sent script in 08/06/19.  Patient's LOV 07/07/2018.  Needs to be schedule for f/u appointment.

## 2019-08-21 NOTE — Unmapped (Signed)
PA Initiated for Targretin through [facility]. Key: [id number]

## 2019-08-25 ENCOUNTER — Telehealth (HOSPITAL_COMMUNITY): Payer: Self-pay

## 2019-08-26 NOTE — Unmapped (Signed)
Fax received stating PA APPROVAL for Bexarotene, Effective until further notice. Approval letter scanned into chart under media tab.

## 2019-08-27 ENCOUNTER — Ambulatory Visit (HOSPITAL_COMMUNITY): Payer: Medicare (Managed Care)

## 2019-08-27 ENCOUNTER — Other Ambulatory Visit: Admit: 2019-08-27 | Discharge: 2019-08-28 | Payer: MEDICARE

## 2019-08-27 ENCOUNTER — Ambulatory Visit: Admit: 2019-08-27 | Discharge: 2019-08-28 | Payer: MEDICARE

## 2019-08-27 DIAGNOSIS — C8400 Mycosis fungoides, unspecified site: Principal | ICD-10-CM

## 2019-08-27 LAB — COMPREHENSIVE METABOLIC PANEL
ALBUMIN: 3.6 g/dL (ref 3.4–5.0)
ALKALINE PHOSPHATASE: 102 U/L (ref 46–116)
ALT (SGPT): 26 U/L (ref 10–49)
ANION GAP: 7 mmol/L (ref 5–14)
BILIRUBIN TOTAL: 0.2 mg/dL — ABNORMAL LOW (ref 0.3–1.2)
CALCIUM: 9.6 mg/dL (ref 8.7–10.4)
CHLORIDE: 107 mmol/L (ref 98–107)
CO2: 23 mmol/L (ref 20.0–31.0)
EGFR CKD-EPI AA MALE: >90 mL/min/{1.73_m2} (ref >=60)
EGFR CKD-EPI NON-AA MALE: 87 mL/min/{1.73_m2} (ref >=60)
GLUCOSE RANDOM: 156 mg/dL (ref 70–179)
PROTEIN TOTAL: 7.5 g/dL (ref 5.7–8.2)
SODIUM: 137 mmol/L (ref 135–145)

## 2019-08-27 LAB — CBC W/ AUTO DIFF
BASOPHILS ABSOLUTE COUNT: 0.1 10*9/L (ref 0.0–0.1)
BASOPHILS RELATIVE PERCENT: 0.9 %
EOSINOPHILS ABSOLUTE COUNT: 4.7 10*9/L — ABNORMAL HIGH (ref 0.0–0.4)
EOSINOPHILS RELATIVE PERCENT: 42.8 %
HEMATOCRIT: 41.7 % (ref 41.0–53.0)
HEMOGLOBIN: 13.1 g/dL — ABNORMAL LOW (ref 13.5–17.5)
LARGE UNSTAINED CELLS: 2 % (ref 0–4)
LYMPHOCYTES ABSOLUTE COUNT: 3 10*9/L (ref 1.5–5.0)
LYMPHOCYTES RELATIVE PERCENT: 27.4 %
MEAN CORPUSCULAR HEMOGLOBIN: 28.5 pg (ref 26.0–34.0)
MEAN PLATELET VOLUME: 9.3 fL (ref 7.0–10.0)
MONOCYTES ABSOLUTE COUNT: 0.3 10*9/L (ref 0.2–0.8)
MONOCYTES RELATIVE PERCENT: 2.9 %
NEUTROPHILS RELATIVE PERCENT: 23.6 %
PLATELET COUNT: 254 10*9/L (ref 150–440)
RED BLOOD CELL COUNT: 4.58 10*12/L (ref 4.50–5.90)
RED CELL DISTRIBUTION WIDTH: 14 % (ref 12.0–15.0)
WBC ADJUSTED: 10.9 10*9/L (ref 4.5–11.0)

## 2019-08-27 LAB — BILIRUBIN TOTAL: Bilirubin:MCnc:Pt:Ser/Plas:Qn:: 0.2 — ABNORMAL LOW

## 2019-08-27 LAB — RED CELL DISTRIBUTION WIDTH: Lab: 14

## 2019-08-27 MED ORDER — ONDANSETRON HCL 8 MG TABLET
ORAL_TABLET | Freq: Three times a day (TID) | ORAL | 2 refills | 10.00000 days | Status: CP | PRN
Start: 2019-08-27 — End: 2020-08-26

## 2019-08-27 MED ADMIN — gemcitabine (GEMZAR) 2,460 mg in sodium chloride (NS) 0.9 % 250 mL IVPB: 1200 mg/m2 | INTRAVENOUS | @ 16:00:00 | Stop: 2019-08-27

## 2019-08-27 MED ADMIN — dexAMETHasone (DECADRON) tablet 8 mg: 8 mg | ORAL | @ 16:00:00 | Stop: 2019-08-27

## 2019-08-27 MED ADMIN — sodium chloride (NS) 0.9 % infusion: 100 mL/h | INTRAVENOUS | @ 16:00:00

## 2019-08-27 NOTE — Unmapped (Signed)
Pt arrives to infusion for treatment. Pt is free of complaints and denies recent illness. Pt had PIV placed pta. Pt tolerated treatment well. PIV with brisk blood return pre and post infusion. PIV flushed with saline and cath tip intact on removal. Gauze held in place with coban. Pt tolerated well.  Discharge completed and all questions answered. Pt is aware of next appointment. Pt left department with steady gait in nad.

## 2019-08-27 NOTE — Unmapped (Unsigned)
PIV inserted and labs drawn with no complications by  Chae Summe .  PIV flushed with saline.

## 2019-08-27 NOTE — Unmapped (Signed)
Pharmacy: First Cycle Chemotherapy Patient Education    Chemotherapy regimen/agents: gemcitabine  Venous Access: PIV  Caregivers present for education: none    Mr. Scheff is a 74 year old with mycoses fungoides. Chemotherapy education was provided to the patient by an oncology pharmacist.      Side effects discussed included but were not limited to:   extravasation, complications associated with myelosuppresion (such as infection/fever, fatigue, and bleeding), nausea/vomiting, mucositis, flu-like symptoms or rash.    The patient handout from [facility] or the Hematology/Oncology Fellow on-call phone number for concerns after hours were given.The patient verbalized understanding of this information.  Medication reconciliation was completed and the medication list was updated in EPIC.      Approximate time spent with patient: 15  Minutes.    Kennon Holter, PharmD, CPP

## 2019-08-27 NOTE — Unmapped (Signed)
Labs found to be within parameters for treatment today. Request for drug sent to pharmacy.

## 2019-08-27 NOTE — Unmapped (Signed)
Lab on 08/27/2019   Component Date Value Ref Range Status   ??? Sodium 08/27/2019 137  135 - 145 mmol/L Final   ??? Potassium 08/27/2019    Final    Specimen hemolyzed     ??? Chloride 08/27/2019 107  98 - 107 mmol/L Final   ??? Anion Gap 08/27/2019 7  5 - 14 mmol/L Final   ??? CO2 08/27/2019 23.0  20.0 - 31.0 mmol/L Final   ??? BUN 08/27/2019    Final    Specimen hemolyzed     ??? Creatinine 08/27/2019 0.85  0.60 - 1.10 mg/dL Final   ??? EGFR CKD-EPI Non-African American,* 08/27/2019 87  >=60 mL/min/1.75m2 Final   ??? EGFR CKD-EPI African American, Male 08/27/2019 >90  >=60 mL/min/1.2m2 Final   ??? Glucose 08/27/2019 156  70 - 179 mg/dL Final   ??? Calcium 41/32/4401 9.6  8.7 - 10.4 mg/dL Final   ??? Albumin 02/72/5366 3.6  3.4 - 5.0 g/dL Final   ??? Total Protein 08/27/2019 7.5  5.7 - 8.2 g/dL Final   ??? Total Bilirubin 08/27/2019 0.2* 0.3 - 1.2 mg/dL Final   ??? AST 44/03/4740    Final    Specimen hemolyzed   ??? ALT 08/27/2019 26  10 - 49 U/L Final   ??? Alkaline Phosphatase 08/27/2019 102  46 - 116 U/L Final   ??? WBC 08/27/2019 10.9  4.5 - 11.0 10*9/L Final   ??? RBC 08/27/2019 4.58  4.50 - 5.90 10*12/L Final   ??? HGB 08/27/2019 13.1* 13.5 - 17.5 g/dL Final   ??? HCT 59/56/3875 41.7  41.0 - 53.0 % Final   ??? MCV 08/27/2019 91.2  80.0 - 100.0 fL Final   ??? MCH 08/27/2019 28.5  26.0 - 34.0 pg Final   ??? MCHC 08/27/2019 31.3  31.0 - 37.0 g/dL Final   ??? RDW 64/33/2951 14.0  12.0 - 15.0 % Final   ??? MPV 08/27/2019 9.3  7.0 - 10.0 fL Final   ??? Platelet 08/27/2019 254  150 - 440 10*9/L Final   ??? Neutrophils % 08/27/2019 23.6  % Final   ??? Lymphocytes % 08/27/2019 27.4  % Final   ??? Monocytes % 08/27/2019 2.9  % Final   ??? Eosinophils % 08/27/2019 42.8  % Final   ??? Basophils % 08/27/2019 0.9  % Final   ??? Absolute Neutrophils 08/27/2019 2.6  2.0 - 7.5 10*9/L Final   ??? Absolute Lymphocytes 08/27/2019 3.0  1.5 - 5.0 10*9/L Final   ??? Absolute Monocytes 08/27/2019 0.3  0.2 - 0.8 10*9/L Final   ??? Absolute Eosinophils 08/27/2019 4.7* 0.0 - 0.4 10*9/L Final   ??? Absolute Basophils 08/27/2019 0.1  0.0 - 0.1 10*9/L Final   ??? Large Unstained Cells 08/27/2019 2  0 - 4 % Final   ??? Hypochromasia 08/27/2019 Slight* Not Present Final

## 2019-08-27 NOTE — Unmapped (Signed)
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 PET scan showed diffuse adenopathy with minimal/moderate FDG avidity. CD30 stained scattered cells.  Stage: Stage Iva (T3NxM0B2)  Regimen: 06/2018 - bexarotene 150mg /day and increased to 300mg /day on 08/14/18 (went up to 450 for awhile, but his triglycerides increased so brought back to 300)  -Gemcitabine to start 08/2019    His disease control with bexarotene has never been great but I think there has been a little disease progression and he is not longer satisfied with current control.  Therefore we will stop the bexarotene and start gemcitabine.  Today is C1D1 gemcitabine. Plan to continue for around 6 cycles if he is responding well.    No significant changes in skin or health status/symptoms since last assessment.      He will return in one week for interval assessment and treatment.   He will not continue the bexarotene.  He will continue lipid medications and thyroid until next cycle; we can reassess labs at that time.     In the meantime I encouraged him to exercise and not watch as much television.        OTHER ISSUES:   1. Bilateral upper extremity weakness and numbness: At prior visit, patient notes new complaint of progressive weakness and numbness in his hands for the last 1-2 months, with objective weakness to confrontation strength testing in bilateral arms. He has a normal gait and no lower extremity symptoms to suggest neuropathy in these areas. Involvement of both arms raises concern for bilateral radiculopathy (though this seems less likely in the setting of subacute concurrent bilateral symptoms) or more central compressive process. Symptoms are unlikely to relate to his CTCL or therapy. He warrants C (and likely T) spine imaging, and we advised that he follow up closely with his PCP for further evaluation. Imaging is still pending authorization.     2. Drug induced hypothyroidism: Continue synthroid until next cycle.     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 He does have some LAD on his recent PET scan, but given current presentation I think this is most likely due to his MF rather than CLL. At this time there is no urgency to biopsy a node to confirm that it is not from CLL since, even if CLL were present, no treatment would be indicated at this time.    4. Hypertriglyceridemia: His Tg have been slowly increaseing since we started bexarotene - he has stopped bexarotene - he will continue current medications until next cycle and we can recheck.     5. He got the covid vaccine      =========================================    HISTORY OF PRESENT ILLNESS:    No worsening of his skin rash/lesions. Mild associated pruritis.  No fevers, chills, sweats, chest pain, cough, shortness of breath, abdominal pain, nausea, weight loss, reflux.  Normal bowel and bladder habits.  No extremity edema.  He reports difficulty sleeping.  He watchings television most of the day.  Not performing routine exercise. Blood glucose levels have been good- he is off metformin.        MEDICATIONS:  Current Outpatient Medications   Medication Sig Dispense Refill   ??? aspirin (ECOTRIN) 81 MG tablet Take 81 mg by mouth once as needed.     ???  atorvastatin (LIPITOR) 40 MG tablet TAKE 1 TABLET BY MOUTH ONCE DAILY FOR CHOLESTEROL 90 tablet 0   ??? cetirizine (ZYRTEC) 10 MG tablet Take 1 tablet (10 mg total) by mouth daily. 30 tablet 5   ??? clobetasoL (TEMOVATE) 0.05 % ointment Apply topically to the affected area(s) twice daily as needed for itching. 60 g 0   ??? gabapentin (NEURONTIN) 300 MG capsule Take 300 mg by mouth Three (3) times a day.     ??? hydrOXYzine (ATARAX) 25 MG tablet Take 1 tablet (25 mg total) by mouth nightly. 30 tablet 5   ??? ibuprofen (MOTRIN) 600 MG tablet 600 mg.     ??? levothyroxine (SYNTHROID) 100 MCG tablet Take 1 tablet (100 mcg total) by mouth daily. 90 tablet 1   ??? tamsulosin (FLOMAX) 0.4 mg capsule Take 0.4 mg by mouth nightly.     ??? traZODone (DESYREL) 100 MG tablet      ??? zolpidem (AMBIEN) 5 MG tablet        No current facility-administered medications for this visit.         VITAL SIGNS:   Vitals:    08/27/19 0933 08/27/19 0934 08/27/19 0935   BP: 124/63     Pulse: 62     Temp: 36.1 ??C (97 ??F)     Weight: 89.1 kg (196 lb 6.4 oz)  89.1 kg (196 lb 6.4 oz)   Height:  165.1 cm (5' 5) 165.1 cm (5' 5)       EXAM:  CONST: NAD, Awake, Alert  LYMPH: No appreciable cervical, supraclavicular, axillary, or inguinal LAD  RESP: Rhonchi right lung cleared with cough; otherwise CTAB.  CV: Regular rate and rhythm. No rubs, gallops or murmurs.   GI: Soft, nontender, nondistended. No hepatosplenomegaly.   MSK: No edema.  Skin: Several erythematous folliculocentric papules, plaques and nodules with overlying scale and crust most predominantly on the bilateral upper extremities (L>R) but also involving the ears and back, the tumors have progressed slighty on his extremities and he has new lesions on his abdominal wall                                LABORATORY:  Lab on 08/27/2019   Component Date Value Ref Range Status   ??? Sodium 08/27/2019 137  135 - 145 mmol/L Final   ??? Potassium 08/27/2019    Final    Specimen hemolyzed     ??? Chloride 08/27/2019 107  98 - 107 mmol/L Final   ??? Anion Gap 08/27/2019 7  5 - 14 mmol/L Final   ??? CO2 08/27/2019 23.0  20.0 - 31.0 mmol/L Final   ??? BUN 08/27/2019    Final    Specimen hemolyzed     ??? Creatinine 08/27/2019 0.85  0.60 - 1.10 mg/dL Final   ??? EGFR CKD-EPI Non-African American,* 08/27/2019 87  >=60 mL/min/1.67m2 Final   ??? EGFR CKD-EPI African American, Male 08/27/2019 >90  >=60 mL/min/1.24m2 Final   ??? Glucose 08/27/2019 156  70 - 179 mg/dL Final   ??? Calcium 16/10/9602 9.6  8.7 - 10.4 mg/dL Final   ??? Albumin 54/09/8117 3.6  3.4 - 5.0 g/dL Final   ??? Total Protein 08/27/2019 7.5  5.7 - 8.2 g/dL Final   ??? Total Bilirubin 08/27/2019 0.2* 0.3 - 1.2 mg/dL Final   ??? AST 14/78/2956    Final    Specimen hemolyzed   ??? ALT 08/27/2019 26  10 - 49 U/L Final   ??? Alkaline Phosphatase 08/27/2019 102  46 - 116 U/L Final   ??? WBC 08/27/2019 10.9  4.5 - 11.0 10*9/L Final   ??? RBC 08/27/2019 4.58  4.50 - 5.90 10*12/L Final   ??? HGB 08/27/2019 13.1* 13.5 - 17.5 g/dL Final   ??? HCT 16/10/9602 41.7  41.0 - 53.0 % Final   ??? MCV 08/27/2019 91.2  80.0 - 100.0 fL Final   ??? MCH 08/27/2019 28.5  26.0 - 34.0 pg Final   ??? MCHC 08/27/2019 31.3  31.0 - 37.0 g/dL Final   ??? RDW 54/09/8117 14.0  12.0 - 15.0 % Final   ??? MPV 08/27/2019 9.3  7.0 - 10.0 fL Final   ??? Platelet 08/27/2019 254  150 - 440 10*9/L Final   ??? Neutrophils % 08/27/2019 23.6  % Final   ??? Lymphocytes % 08/27/2019 27.4  % Final   ??? Monocytes % 08/27/2019 2.9  % Final   ??? Eosinophils % 08/27/2019 42.8  % Final   ??? Basophils % 08/27/2019 0.9  % Final   ??? Absolute Neutrophils 08/27/2019 2.6  2.0 - 7.5 10*9/L Final   ??? Absolute Lymphocytes 08/27/2019 3.0  1.5 - 5.0 10*9/L Final   ??? Absolute Monocytes 08/27/2019 0.3  0.2 - 0.8 10*9/L Final   ??? Absolute Eosinophils 08/27/2019 4.7* 0.0 - 0.4 10*9/L Final   ??? Absolute Basophils 08/27/2019 0.1  0.0 - 0.1 10*9/L Final   ??? Large Unstained Cells 08/27/2019 2  0 - 4 % Final   ??? Hypochromasia 08/27/2019 Slight* Not Present Final

## 2019-09-02 ENCOUNTER — Ambulatory Visit (HOSPITAL_COMMUNITY): Payer: BC Managed Care – PPO

## 2019-09-03 ENCOUNTER — Ambulatory Visit: Admit: 2019-09-03 | Discharge: 2019-09-04 | Payer: MEDICARE

## 2019-09-03 ENCOUNTER — Other Ambulatory Visit: Admit: 2019-09-03 | Discharge: 2019-09-04 | Payer: MEDICARE

## 2019-09-03 DIAGNOSIS — C8400 Mycosis fungoides, unspecified site: Principal | ICD-10-CM

## 2019-09-07 ENCOUNTER — Emergency Department (HOSPITAL_COMMUNITY)
Admission: EM | Admit: 2019-09-07 | Discharge: 2019-09-07 | Discharge status: Against Medical Advice | Payer: Medicare (Managed Care) | Attending: Emergency Medicine | Admitting: Emergency Medicine

## 2019-09-07 ENCOUNTER — Other Ambulatory Visit: Payer: Self-pay

## 2019-09-07 ENCOUNTER — Encounter (HOSPITAL_COMMUNITY): Payer: Self-pay | Admitting: *Deleted

## 2019-09-07 DIAGNOSIS — R2232 Localized swelling, mass and lump, left upper limb: Secondary | ICD-10-CM | POA: Diagnosis present

## 2019-09-07 DIAGNOSIS — L989 Disorder of the skin and subcutaneous tissue, unspecified: Secondary | ICD-10-CM | POA: Insufficient documentation

## 2019-09-07 DIAGNOSIS — Z5321 Procedure and treatment not carried out due to patient leaving prior to being seen by health care provider: Secondary | ICD-10-CM | POA: Insufficient documentation

## 2019-09-07 NOTE — ED Triage Notes (Signed)
Delay explained to patient

## 2019-09-07 NOTE — ED Triage Notes (Signed)
Lesions on left arm, patient is being seen in [facility] for same and treated for skin cancer.

## 2019-09-07 NOTE — ED Notes (Signed)
Left prior to room assignment

## 2019-09-07 NOTE — ED Notes (Signed)
Not in waiting area.  

## 2019-09-07 NOTE — ED Notes (Signed)
Request pain med refill until appointment with [facility] this week.

## 2019-09-08 MED ORDER — ZOLPIDEM 5 MG TABLET
0 days
Start: 2019-09-08 — End: ?

## 2019-09-10 ENCOUNTER — Other Ambulatory Visit (HOSPITAL_COMMUNITY): Payer: Self-pay | Admitting: Neurology

## 2019-09-10 ENCOUNTER — Ambulatory Visit: Admit: 2019-09-10 | Discharge: 2019-09-10 | Payer: MEDICARE

## 2019-09-10 ENCOUNTER — Other Ambulatory Visit: Admit: 2019-09-10 | Discharge: 2019-09-10 | Payer: MEDICARE

## 2019-09-10 DIAGNOSIS — G959 Disease of spinal cord, unspecified: Secondary | ICD-10-CM

## 2019-09-10 DIAGNOSIS — C8400 Mycosis fungoides, unspecified site: Principal | ICD-10-CM

## 2019-09-21 MED ORDER — HYDROCODONE 5 MG-ACETAMINOPHEN 325 MG TABLET
0 days
Start: 2019-09-21 — End: ?

## 2019-09-24 ENCOUNTER — Other Ambulatory Visit: Admit: 2019-09-24 | Discharge: 2019-09-25 | Payer: MEDICARE

## 2019-09-24 ENCOUNTER — Ambulatory Visit: Admit: 2019-09-24 | Discharge: 2019-09-25 | Payer: MEDICARE

## 2019-09-24 DIAGNOSIS — C8400 Mycosis fungoides, unspecified site: Principal | ICD-10-CM

## 2019-09-24 DIAGNOSIS — E032 Hypothyroidism due to medicaments and other exogenous substances: Principal | ICD-10-CM

## 2019-09-24 DIAGNOSIS — E785 Hyperlipidemia, unspecified: Principal | ICD-10-CM

## 2019-09-24 MED ORDER — CLOBETASOL 0.05 % TOPICAL LOTION IN PUMP
Freq: Two times a day (BID) | TOPICAL | 0 refills | 0.00000 days | Status: CP | PRN
Start: 2019-09-24 — End: ?

## 2019-10-02 ENCOUNTER — Emergency Department (HOSPITAL_COMMUNITY)
Admission: EM | Admit: 2019-10-02 | Discharge: 2019-10-03 | Disposition: A | Discharge status: Home / Self Care | Payer: Medicare (Managed Care) | Attending: Emergency Medicine | Admitting: Emergency Medicine

## 2019-10-02 ENCOUNTER — Encounter (HOSPITAL_COMMUNITY): Payer: Self-pay

## 2019-10-02 ENCOUNTER — Other Ambulatory Visit: Payer: Self-pay

## 2019-10-02 ENCOUNTER — Emergency Department (HOSPITAL_COMMUNITY): Payer: Medicare (Managed Care)

## 2019-10-02 DIAGNOSIS — R600 Localized edema: Secondary | ICD-10-CM | POA: Insufficient documentation

## 2019-10-02 DIAGNOSIS — Z79899 Other long term (current) drug therapy: Secondary | ICD-10-CM | POA: Insufficient documentation

## 2019-10-02 DIAGNOSIS — Z7984 Long term (current) use of oral hypoglycemic drugs: Secondary | ICD-10-CM | POA: Insufficient documentation

## 2019-10-02 DIAGNOSIS — E119 Type 2 diabetes mellitus without complications: Secondary | ICD-10-CM | POA: Insufficient documentation

## 2019-10-02 DIAGNOSIS — I10 Essential (primary) hypertension: Secondary | ICD-10-CM | POA: Insufficient documentation

## 2019-10-02 DIAGNOSIS — Z87891 Personal history of nicotine dependence: Secondary | ICD-10-CM | POA: Insufficient documentation

## 2019-10-02 LAB — CBC WITH DIFFERENTIAL/PLATELET
Abs Immature Granulocytes: 0.14 10*3/uL — ABNORMAL HIGH (ref 0.00–0.07)
Basophils Absolute: 0.1 10*3/uL (ref 0.0–0.1)
Basophils Relative: 0 %
Eosinophils Absolute: 16.9 10*3/uL — ABNORMAL HIGH (ref 0.0–0.5)
Eosinophils Relative: 74 %
HCT: 40.8 % (ref 39.0–52.0)
Hemoglobin: 12.8 g/dL — ABNORMAL LOW (ref 13.0–17.0)
Immature Granulocytes: 1 %
Lymphocytes Relative: 11 %
Lymphs Abs: 2.4 10*3/uL (ref 0.7–4.0)
MCH: 29.3 pg (ref 26.0–34.0)
MCHC: 31.4 g/dL (ref 30.0–36.0)
MCV: 93.4 fL (ref 80.0–100.0)
Monocytes Absolute: 0.3 10*3/uL (ref 0.1–1.0)
Monocytes Relative: 2 %
Neutro Abs: 2.6 10*3/uL (ref 1.7–7.7)
Neutrophils Relative %: 12 %
Platelets: 205 10*3/uL (ref 150–400)
RBC: 4.37 MIL/uL (ref 4.22–5.81)
RDW: 16.9 % — ABNORMAL HIGH (ref 11.5–15.5)
WBC: 22.5 10*3/uL — ABNORMAL HIGH (ref 4.0–10.5)
nRBC: 0.1 % (ref 0.0–0.2)

## 2019-10-02 LAB — CBG MONITORING, ED: Glucose-Capillary: 202 mg/dL — ABNORMAL HIGH (ref 70–99)

## 2019-10-02 LAB — COMPREHENSIVE METABOLIC PANEL
ALT: 36 U/L (ref 0–44)
AST: 20 U/L (ref 15–41)
Albumin: 3.4 g/dL — ABNORMAL LOW (ref 3.5–5.0)
Alkaline Phosphatase: 126 U/L (ref 38–126)
Anion gap: 11 (ref 5–15)
BUN: 13 mg/dL (ref 8–23)
CO2: 22 mmol/L (ref 22–32)
Calcium: 9.1 mg/dL (ref 8.9–10.3)
Chloride: 109 mmol/L (ref 98–111)
Creatinine, Ser: 0.93 mg/dL (ref 0.61–1.24)
GFR calc Af Amer: >60 mL/min (ref >60)
GFR calc non Af Amer: >60 mL/min (ref >60)
Glucose, Bld: 174 mg/dL — ABNORMAL HIGH (ref 70–99)
Potassium: 3.7 mmol/L (ref 3.5–5.1)
Sodium: 142 mmol/L (ref 135–145)
Total Bilirubin: 0.2 mg/dL — ABNORMAL LOW (ref 0.3–1.2)
Total Protein: 6.9 g/dL (ref 6.5–8.1)

## 2019-10-02 LAB — BRAIN NATRIURETIC PEPTIDE: B Natriuretic Peptide: 81 pg/mL (ref 0.0–100.0)

## 2019-10-02 NOTE — ED Notes (Signed)
Pt given ginger ale per request

## 2019-10-02 NOTE — ED Triage Notes (Signed)
Pt to er, pt states that he is here for some leg pain, states that he also has some leg swelling.  Pt states that he was told that he has fluid in his legs.

## 2019-10-02 NOTE — ED Provider Notes (Addendum)
[facility] EMERGENCY DEPARTMENT Provider Note   CSN: [id number] Arrival date & time: 10/02/19  1352     History Chief Complaint  Patient presents with  . Leg Swelling    Cory Lynch is a 74 y.o. male with PMHx diabetes, HTN, who presents to the ED today with complaint of gradual onset, constant, unchanged, bilateral leg swelling x 2-3 months. Pt reports that he was walking today and felt like his leg swelling got worse; prompting him to come to the ED. He states he has addressed this with his PCP but they have not done anything for him. Pt denies hx of CHF. He denies chest pain, shortness of breath, abdominal distention, or any other associated symptoms.   The history is provided by the patient and medical records.       Past Medical History:  Diagnosis Date  . Diabetes mellitus without complication ([place])   . Hypercholesteremia   . Hypertension     Patient Active Problem List   Diagnosis Date Noted  . CLL (chronic lymphocytic leukemia) ([place]) 10/05/2016    Past Surgical History:  Procedure Laterality Date  . CYSTOSCOPY WITH LITHOLAPAXY N/A 09/17/2016   Procedure: CYSTOSCOPY WITH LITHOLAPAXY;  Surgeon: Cleon Gustin, MD;  Location: [facility];  Service: Urology;  Laterality: N/A;  30 MINS [phone] [id number]  . HOLMIUM LASER APPLICATION N/A 9/70/2637   Procedure: HOLMIUM LASER APPLICATION;  Surgeon: Cleon Gustin, MD;  Location: [facility];  Service: Urology;  Laterality: N/A;       Family History  Problem Relation Age of Onset  . Cancer Brother     Social History   Tobacco Use  . Smoking status: Former Smoker    Packs/day: 0.25    Types: Cigarettes    Quit date: 09/14/2013    Years since quitting: 6.0  . Smokeless tobacco: Never Used  Vaping Use  . Vaping Use: Never used  Substance Use Topics  . Alcohol use: No  . Drug use: No    Home Medications Prior to Admission medications   Medication Sig Start Date End  Date Taking? Authorizing Provider  hydrOXYzine (ATARAX/VISTARIL) 25 MG tablet Take 1 tablet (25 mg total) by mouth every 6 (six) hours. 01/08/19   Recardo Evangelist, PA-C  metFORMIN (GLUCOPHAGE) 500 MG tablet  01/30/17   [provider]  Omega-3 Fatty Acids (FISH OIL) 1000 MG CAPS Take 1 capsule by mouth daily.    [provider]  tamsulosin (FLOMAX) 0.4 MG CAPS capsule Take 1 capsule (0.4 mg total) by mouth daily after supper. 09/17/16   McKenzie, Candee Furbish, MD  traMADol (ULTRAM) 50 MG tablet Take 1 tablet (50 mg total) by mouth every 6 (six) hours as needed. 04/07/18   Triplett, Tammy, PA-C    Allergies    Patient has no known allergies.  Review of Systems   Review of Systems  Constitutional: Negative for chills and fever.  Respiratory: Negative for shortness of breath.   Cardiovascular: Positive for leg swelling (bilaterally). Negative for chest pain.  All other systems reviewed and are negative.   Physical Exam Updated Vital Signs BP 114/66 (BP Location: Right Arm)   Pulse 65   Temp (!) 96 F (35.6 C)   Resp 17   Ht 5\' 4"  (1.626 m)   Wt 93.9 kg   SpO2 99%   BMI 35.53 kg/m   Physical Exam Vitals and nursing note reviewed.  Constitutional:      Appearance: He is not  ill-appearing or diaphoretic.  HENT:     Head: Normocephalic and atraumatic.  Eyes:     Conjunctiva/sclera: Conjunctivae normal.  Cardiovascular:     Rate and Rhythm: Normal rate and regular rhythm.     Pulses: Normal pulses.  Pulmonary:     Effort: Pulmonary effort is normal.     Breath sounds: Normal breath sounds. No wheezing, rhonchi or rales.  Abdominal:     Palpations: Abdomen is soft.     Tenderness: There is no abdominal tenderness. There is no guarding or rebound.  Musculoskeletal:     Cervical back: Neck supple.     Comments: Bilateral 1+ pitting edema. No calf TTP. Negative homan's sign bilaterally. Good perfusion throughout legs; they are warm and dry. 2+ DP pulses.   Skin:     General: Skin is warm and dry.  Neurological:     Mental Status: He is alert.     ED Results / Procedures / Treatments   Labs (all labs ordered are listed, but only abnormal results are displayed) Labs Reviewed  COMPREHENSIVE METABOLIC PANEL - Abnormal; Notable for the following components:      Result Value   Glucose, Bld 174 (*)    Albumin 3.4 (*)    Total Bilirubin 0.2 (*)    All other components within normal limits  CBC WITH DIFFERENTIAL/PLATELET - Abnormal; Notable for the following components:   WBC 22.5 (*)    Hemoglobin 12.8 (*)    RDW 16.9 (*)    Eosinophils Absolute 16.9 (*)    Abs Immature Granulocytes 0.14 (*)    All other components within normal limits  BRAIN NATRIURETIC PEPTIDE    EKG None  Radiology No results found.  Procedures Procedures (including critical care time)  Medications Ordered in ED Medications - No data to display  ED Course  I have reviewed the triage vital signs and the nursing notes.  Pertinent labs & imaging results that were available during my care of the patient were reviewed by me and considered in my medical decision making (see chart for details).    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  States it got worse today after a walk prompting him to come to the ED.  He states he has addressed this with his PCP however they have not done anything for him.  On arrival to the ED patient is afebrile, nontachycardic nontachypneic.  He appears to be in no acute distress.  He has good sensation throughout his feet and good distal pulses.  He has 1+ pitting edema bilaterally.  He has no calf tenderness.  No or swelling on one side compared to the other.  He denies any shortness of breath or chest pain.  He has no history of CHF.  I very much doubt a DVT versus CHF exacerbation.   Suspect dependent edema is most likely the cause.  However we will plan to check labs at this time including CBC, CMP, BNP.  Patient may benefit from home Lasix however given this has been ongoing for 2 to 3 months I feel that this can be discussed with PCP.   CBC with a white blood cell count of 22.5.  Per care everywhere patient did  have lab work done at hematologist office on 8/26 with similar value. CMP with glucose 174. No other findings BNP 81.0  At shift change case signed out to Dr. Wyvonnia Dusky who will evaluate patient and dispo accordingly. Pending CXR at this time.   This note was prepared using Dragon voice recognition software and may include unintentional dictation errors due to the inherent limitations of voice recognition software.  Final Clinical Impression(s) / ED Diagnoses Final diagnoses:  Bilateral lower extremity edema    Rx / DC Orders ED Discharge Orders    None         Eustaquio Maize, PA-C 10/02/19 2337    Ezequiel Essex, MD 10/03/19 [phone]

## 2019-10-02 NOTE — Discharge Instructions (Addendum)
Keep legs elevated.  Use the compression stockings.  Follow-up for ultrasounds of her legs to rule out blood clots.  Return to the ED with chest pain, shortness of breath, fever, any other concerns.

## 2019-10-02 NOTE — ED Notes (Signed)
Patient called out stating that he felt as if his blood sugar was dropping. Checked patient's CBG 202 at this time. Patient assisted to restroom at this time.

## 2019-10-03 MED ORDER — FUROSEMIDE 20 MG PO TABS: 10.0000 mg | ORAL_TABLET | Freq: Every day | ORAL | 0 refills | Status: DC | PRN

## 2019-10-03 NOTE — ED Provider Notes (Signed)
Care assumed from [facility].  Patient with several weeks of leg swelling.  No chest pain or shortness of breath.  No history of blood clot.  Ambulatory without desaturation.  Intact distal pulses.  Leukocytosis appears to be chronic consistent with previous values  We will try low-dose Lasix.  Outpatient follow-up for Doppler studies. Compression stockings.   BP 115/69   Pulse 67   Temp (!) 96 F (35.6 C)   Resp 18   Ht 5\' 4"  (1.626 m)   Wt 93.9 kg   SpO2 99%   BMI 35.53 kg/m     Ezequiel Essex, MD 10/03/19 [phone]

## 2019-10-03 NOTE — ED Notes (Signed)
Pt. Ambulated well at 100% O2 sat. Pt. Had no signs of wheezing.

## 2019-10-04 ENCOUNTER — Ambulatory Visit (HOSPITAL_COMMUNITY): Admission: RE | Admit: 2019-10-04 | Payer: BC Managed Care – PPO | Source: Ambulatory Visit

## 2019-10-05 ENCOUNTER — Encounter (HOSPITAL_COMMUNITY): Payer: Self-pay

## 2019-10-05 ENCOUNTER — Other Ambulatory Visit: Payer: Self-pay

## 2019-10-05 ENCOUNTER — Inpatient Hospital Stay (HOSPITAL_COMMUNITY)
Admission: EM | Admit: 2019-10-05 | Discharge: 2019-10-09 | DRG: 871 | Disposition: A | Discharge status: Skilled Nursing Facility | Payer: Medicare (Managed Care) | Attending: Family Medicine | Admitting: Family Medicine

## 2019-10-05 ENCOUNTER — Emergency Department (HOSPITAL_COMMUNITY): Payer: Medicare (Managed Care)

## 2019-10-05 DIAGNOSIS — I959 Hypotension, unspecified: Secondary | ICD-10-CM | POA: Diagnosis present

## 2019-10-05 DIAGNOSIS — C8400 Mycosis fungoides, unspecified site: Secondary | ICD-10-CM | POA: Diagnosis present

## 2019-10-05 DIAGNOSIS — T68XXXA Hypothermia, initial encounter: Secondary | ICD-10-CM | POA: Diagnosis present

## 2019-10-05 DIAGNOSIS — E039 Hypothyroidism, unspecified: Secondary | ICD-10-CM | POA: Diagnosis present

## 2019-10-05 DIAGNOSIS — Z87891 Personal history of nicotine dependence: Secondary | ICD-10-CM

## 2019-10-05 DIAGNOSIS — T50905A Adverse effect of unspecified drugs, medicaments and biological substances, initial encounter: Secondary | ICD-10-CM | POA: Diagnosis present

## 2019-10-05 DIAGNOSIS — Z79890 Hormone replacement therapy (postmenopausal): Secondary | ICD-10-CM | POA: Diagnosis not present

## 2019-10-05 DIAGNOSIS — Z809 Family history of malignant neoplasm, unspecified: Secondary | ICD-10-CM | POA: Diagnosis not present

## 2019-10-05 DIAGNOSIS — T502X5A Adverse effect of carbonic-anhydrase inhibitors, benzothiadiazides and other diuretics, initial encounter: Secondary | ICD-10-CM | POA: Diagnosis present

## 2019-10-05 DIAGNOSIS — J9601 Acute respiratory failure with hypoxia: Secondary | ICD-10-CM | POA: Diagnosis present

## 2019-10-05 DIAGNOSIS — D759 Disease of blood and blood-forming organs, unspecified: Secondary | ICD-10-CM | POA: Diagnosis not present

## 2019-10-05 DIAGNOSIS — A419 Sepsis, unspecified organism: Secondary | ICD-10-CM | POA: Diagnosis not present

## 2019-10-05 DIAGNOSIS — Z7982 Long term (current) use of aspirin: Secondary | ICD-10-CM

## 2019-10-05 DIAGNOSIS — R571 Hypovolemic shock: Secondary | ICD-10-CM | POA: Diagnosis present

## 2019-10-05 DIAGNOSIS — D630 Anemia in neoplastic disease: Secondary | ICD-10-CM | POA: Diagnosis present

## 2019-10-05 DIAGNOSIS — G629 Polyneuropathy, unspecified: Secondary | ICD-10-CM | POA: Diagnosis present

## 2019-10-05 DIAGNOSIS — R6521 Severe sepsis with septic shock: Secondary | ICD-10-CM | POA: Diagnosis present

## 2019-10-05 DIAGNOSIS — E119 Type 2 diabetes mellitus without complications: Secondary | ICD-10-CM | POA: Diagnosis not present

## 2019-10-05 DIAGNOSIS — Z20822 Contact with and (suspected) exposure to covid-19: Secondary | ICD-10-CM | POA: Diagnosis present

## 2019-10-05 DIAGNOSIS — E7800 Pure hypercholesterolemia, unspecified: Secondary | ICD-10-CM | POA: Diagnosis present

## 2019-10-05 DIAGNOSIS — I119 Hypertensive heart disease without heart failure: Secondary | ICD-10-CM | POA: Diagnosis present

## 2019-10-05 DIAGNOSIS — G4700 Insomnia, unspecified: Secondary | ICD-10-CM | POA: Diagnosis present

## 2019-10-05 DIAGNOSIS — D6481 Anemia due to antineoplastic chemotherapy: Secondary | ICD-10-CM | POA: Diagnosis present

## 2019-10-05 DIAGNOSIS — E032 Hypothyroidism due to medicaments and other exogenous substances: Secondary | ICD-10-CM | POA: Diagnosis present

## 2019-10-05 DIAGNOSIS — Z79899 Other long term (current) drug therapy: Secondary | ICD-10-CM | POA: Diagnosis not present

## 2019-10-05 DIAGNOSIS — C9110 Chronic lymphocytic leukemia of B-cell type not having achieved remission: Secondary | ICD-10-CM | POA: Diagnosis present

## 2019-10-05 DIAGNOSIS — D6959 Other secondary thrombocytopenia: Secondary | ICD-10-CM | POA: Diagnosis present

## 2019-10-05 DIAGNOSIS — I9589 Other hypotension: Secondary | ICD-10-CM | POA: Diagnosis not present

## 2019-10-05 DIAGNOSIS — D649 Anemia, unspecified: Secondary | ICD-10-CM | POA: Diagnosis not present

## 2019-10-05 DIAGNOSIS — T451X5A Adverse effect of antineoplastic and immunosuppressive drugs, initial encounter: Secondary | ICD-10-CM | POA: Diagnosis present

## 2019-10-05 DIAGNOSIS — E1165 Type 2 diabetes mellitus with hyperglycemia: Secondary | ICD-10-CM | POA: Diagnosis present

## 2019-10-05 DIAGNOSIS — D696 Thrombocytopenia, unspecified: Secondary | ICD-10-CM | POA: Diagnosis present

## 2019-10-05 DIAGNOSIS — R0902 Hypoxemia: Secondary | ICD-10-CM

## 2019-10-05 DIAGNOSIS — D72829 Elevated white blood cell count, unspecified: Secondary | ICD-10-CM | POA: Diagnosis present

## 2019-10-05 DIAGNOSIS — R680 Hypothermia, not associated with low environmental temperature: Secondary | ICD-10-CM | POA: Diagnosis present

## 2019-10-05 DIAGNOSIS — R739 Hyperglycemia, unspecified: Secondary | ICD-10-CM | POA: Diagnosis not present

## 2019-10-05 DIAGNOSIS — I10 Essential (primary) hypertension: Secondary | ICD-10-CM | POA: Diagnosis present

## 2019-10-05 DIAGNOSIS — E781 Pure hyperglyceridemia: Secondary | ICD-10-CM | POA: Diagnosis present

## 2019-10-05 DIAGNOSIS — E872 Acidosis, unspecified: Secondary | ICD-10-CM | POA: Diagnosis present

## 2019-10-05 DIAGNOSIS — F5104 Psychophysiologic insomnia: Secondary | ICD-10-CM | POA: Diagnosis present

## 2019-10-05 HISTORY — DX: Hypothyroidism, unspecified: E03.9

## 2019-10-05 HISTORY — DX: Mycosis fungoides, unspecified site: C84.00

## 2019-10-05 LAB — URINALYSIS, ROUTINE W REFLEX MICROSCOPIC
Bilirubin Urine: NEGATIVE
Glucose, UA: NEGATIVE mg/dL
Ketones, ur: NEGATIVE mg/dL
Leukocytes,Ua: NEGATIVE
Nitrite: NEGATIVE
Protein, ur: NEGATIVE mg/dL
Specific Gravity, Urine: 1.024 (ref 1.005–1.030)
pH: 5 (ref 5.0–8.0)

## 2019-10-05 LAB — COMPREHENSIVE METABOLIC PANEL
ALT: 46 U/L — ABNORMAL HIGH (ref 0–44)
AST: 34 U/L (ref 15–41)
Albumin: 2.4 g/dL — ABNORMAL LOW (ref 3.5–5.0)
Alkaline Phosphatase: 92 U/L (ref 38–126)
Anion gap: 8 (ref 5–15)
BUN: 18 mg/dL (ref 8–23)
CO2: 22 mmol/L (ref 22–32)
Calcium: 8.4 mg/dL — ABNORMAL LOW (ref 8.9–10.3)
Chloride: 111 mmol/L (ref 98–111)
Creatinine, Ser: 1.03 mg/dL (ref 0.61–1.24)
GFR calc Af Amer: >60 mL/min (ref >60)
GFR calc non Af Amer: >60 mL/min (ref >60)
Glucose, Bld: 160 mg/dL — ABNORMAL HIGH (ref 70–99)
Potassium: 3.8 mmol/L (ref 3.5–5.1)
Sodium: 141 mmol/L (ref 135–145)
Total Bilirubin: 0.2 mg/dL — ABNORMAL LOW (ref 0.3–1.2)
Total Protein: 4.7 g/dL — ABNORMAL LOW (ref 6.5–8.1)

## 2019-10-05 LAB — CBC WITH DIFFERENTIAL/PLATELET
Abs Immature Granulocytes: 0.13 10*3/uL — ABNORMAL HIGH (ref 0.00–0.07)
Basophils Absolute: 0.1 10*3/uL (ref 0.0–0.1)
Basophils Relative: 0 %
Eosinophils Absolute: 25.7 10*3/uL — ABNORMAL HIGH (ref 0.0–0.5)
Eosinophils Relative: 74 %
HCT: 34.5 % — ABNORMAL LOW (ref 39.0–52.0)
Hemoglobin: 11.1 g/dL — ABNORMAL LOW (ref 13.0–17.0)
Immature Granulocytes: 0 %
Lymphocytes Relative: 7 %
Lymphs Abs: 2.3 10*3/uL (ref 0.7–4.0)
MCH: 29.8 pg (ref 26.0–34.0)
MCHC: 32.2 g/dL (ref 30.0–36.0)
MCV: 92.7 fL (ref 80.0–100.0)
Monocytes Absolute: 0.7 10*3/uL (ref 0.1–1.0)
Monocytes Relative: 2 %
Neutro Abs: 6.1 10*3/uL (ref 1.7–7.7)
Neutrophils Relative %: 17 %
Platelets: 106 10*3/uL — ABNORMAL LOW (ref 150–400)
RBC: 3.72 MIL/uL — ABNORMAL LOW (ref 4.22–5.81)
RDW: 17.2 % — ABNORMAL HIGH (ref 11.5–15.5)
WBC: 35 10*3/uL — ABNORMAL HIGH (ref 4.0–10.5)
nRBC: 0.1 % (ref 0.0–0.2)

## 2019-10-05 LAB — APTT: aPTT: 45 seconds — ABNORMAL HIGH (ref 24–36)

## 2019-10-05 LAB — LACTIC ACID, PLASMA
Lactic Acid, Venous: 2.7 mmol/L (ref 0.5–1.9)
Lactic Acid, Venous: 1.9 mmol/L (ref 0.5–1.9)
Lactic Acid, Venous: 1.4 mmol/L (ref 0.5–1.9)

## 2019-10-05 LAB — PROTIME-INR
INR: 1.3 — ABNORMAL HIGH (ref 0.8–1.2)
Prothrombin Time: 15.3 seconds — ABNORMAL HIGH (ref 11.4–15.2)

## 2019-10-05 LAB — BRAIN NATRIURETIC PEPTIDE: B Natriuretic Peptide: 55 pg/mL (ref 0.0–100.0)

## 2019-10-05 LAB — TSH: TSH: 2.008 u[IU]/mL (ref 0.350–4.500)

## 2019-10-05 LAB — SARS CORONAVIRUS 2 BY RT PCR (HOSPITAL ORDER, PERFORMED IN ~~LOC~~ HOSPITAL LAB): SARS Coronavirus 2: NEGATIVE

## 2019-10-05 MED ORDER — VANCOMYCIN HCL IN DEXTROSE 1-5 GM/200ML-% IV SOLN: 1000.0000 mg | Freq: Once | INTRAVENOUS | Status: DC

## 2019-10-05 MED ORDER — LACTATED RINGERS IV BOLUS (SEPSIS)
800.0000 mL | Freq: Once | INTRAVENOUS | Status: AC
  Administered 2019-10-05: 800 mL via INTRAVENOUS

## 2019-10-05 MED ORDER — VANCOMYCIN HCL 2000 MG/400ML IV SOLN
2000.0000 mg | Freq: Once | INTRAVENOUS | Status: AC
  Administered 2019-10-05: 2000 mg via INTRAVENOUS
  Filled 2019-10-05: qty 400

## 2019-10-05 MED ORDER — LACTATED RINGERS IV SOLN: INTRAVENOUS | Status: DC

## 2019-10-05 MED ORDER — LACTATED RINGERS IV BOLUS
1000.0000 mL | Freq: Once | INTRAVENOUS | Status: AC
  Administered 2019-10-05: 1000 mL via INTRAVENOUS

## 2019-10-05 MED ORDER — SODIUM CHLORIDE 0.9 % IV SOLN
2.0000 g | Freq: Three times a day (TID) | INTRAVENOUS | Status: DC
  Administered 2019-10-06 – 2019-10-07 (×4): 2 g via INTRAVENOUS
  Filled 2019-10-05 (×4): qty 2

## 2019-10-05 MED ORDER — METRONIDAZOLE IN NACL 5-0.79 MG/ML-% IV SOLN
500.0000 mg | Freq: Once | INTRAVENOUS | Status: AC
  Administered 2019-10-05: 500 mg via INTRAVENOUS
  Filled 2019-10-05: qty 100

## 2019-10-05 MED ORDER — VANCOMYCIN HCL 1250 MG/250ML IV SOLN
1250.0000 mg | Freq: Two times a day (BID) | INTRAVENOUS | Status: DC
  Administered 2019-10-06 (×2): 1250 mg via INTRAVENOUS
  Filled 2019-10-05 (×2): qty 250

## 2019-10-05 MED ORDER — SODIUM CHLORIDE 0.9 % IV SOLN
2.0000 g | Freq: Once | INTRAVENOUS | Status: AC
  Administered 2019-10-05: 2 g via INTRAVENOUS
  Filled 2019-10-05: qty 2

## 2019-10-05 MED ORDER — LACTATED RINGERS IV BOLUS (SEPSIS)
1000.0000 mL | Freq: Once | INTRAVENOUS | Status: AC
  Administered 2019-10-05: 1000 mL via INTRAVENOUS

## 2019-10-05 NOTE — ED Notes (Signed)
One step blood cultures collected. Pt very edematous in arms and hands. Per EDP start abt even without second culture. Lab attempting to obtain without success

## 2019-10-05 NOTE — Progress Notes (Signed)
Pharmacy Antibiotic Note  Cory Lynch is a 74 y.o. male admitted on 10/05/2019 with sepsis.  Pharmacy has been consulted for vancomycin and cefepime dosing.  Plan: Vancomycin 1250mg  IV every 12 hours.  Goal trough 15-20 mcg/mL. cefepime 2gm iv q8h  Height: 5\' 4"  (162.6 cm) Weight: 101.6 kg (223 lb 15.8 oz) IBW/kg (Calculated) : 59.2  Temp (24hrs), Avg:89.3 F (31.8 C), Min:89.3 F (31.8 C), Max:89.3 F (31.8 C)  Recent Labs  Lab 10/02/19 2214 10/05/19 1841  WBC 22.5* 35.0*  CREATININE 0.93 1.03  LATICACIDVEN  --  2.7*    Estimated Creatinine Clearance: 69.9 mL/min (by C-G formula based on SCr of 1.03 mg/dL).    No Known Allergies  Antimicrobials this admission: 9/6 cefepime >>  9/6 vancomycin >>  9/6 metronidazole x 1   Microbiology results: 9/6 BCx: sent 9/6 UCx: sent   Thank you for allowing pharmacy to be a part of this patient's care.  Donna Christen Stavros Cail 10/05/2019 7:48 PM

## 2019-10-05 NOTE — ED Triage Notes (Addendum)
Pt brought in by EMS due to BP being low. Pt denies any pain or that anything is bothering him. Pt noted to have generalized edema

## 2019-10-05 NOTE — Progress Notes (Signed)
[facility] Progress Note Patient Name: VAUGHN BEAUMIER DOB: 10-03-1945 MRN: [id number]   Date of Service  10/05/2019  HPI/Events of Note  NP Cherene Altes from [facility] ED discussion for transfer to [facility] ICU evaluation. NP asking for ICU care at [facility].  74 yr old male with CLL, on recent chemo, now with Sepsis; source not clear. Fluid unresponsive ( over 4 lit) hypotesnion in shock. wbc 35 K ( from 20 K a week ago). Covid neg. starting Levophed. on emperical abx.   eICU Interventions  [facility] bed side Team MD notified.  Called back NP at [facility]. Accepted as waiting list, no ICU bed at [facility] yet. He is going to stay in ED until gets bed.      Intervention Category Intermediate Interventions: Communication with other healthcare providers and/or family  Elmer Sow 10/05/2019, 10:33 PM

## 2019-10-05 NOTE — ED Provider Notes (Signed)
Medical screening examination/treatment/procedure(s) were conducted as a shared visit with non-physician practitioner(s) and myself.  I personally evaluated the patient during the encounter.  EKG Interpretation  Date/Time:  Monday October 05 2019 18:01:49 EDT Ventricular Rate:  61 PR Interval:    QRS Duration: 114 QT Interval:  471 QTC Calculation: 475 R Axis:   31 Text Interpretation: Sinus rhythm Short PR interval Borderline intraventricular conduction delay Borderline T wave abnormalities Confirmed by Fredia Sorrow [phone]) on 10/05/2019 6:33:20 PM   Results for orders placed or performed during the hospital encounter of 10/05/19  Blood Culture (routine x 2)   Specimen: Blood  Result Value Ref Range   Specimen Description BLOOD RIGHT FOREARM    Special Requests      BOTTLES DRAWN AEROBIC AND ANAEROBIC Blood Culture adequate volume Performed at [facility], [address], [place], [place] [address]    Culture PENDING    Report Status PENDING   SARS Coronavirus 2 by RT PCR (hospital order, performed in [place] hospital lab) Nasopharyngeal Nasopharyngeal Swab   Specimen: Nasopharyngeal Swab  Result Value Ref Range   SARS Coronavirus 2 NEGATIVE NEGATIVE  Lactic acid, plasma  Result Value Ref Range   Lactic Acid, Venous 2.7 (HH) 0.5 - 1.9 mmol/L  Lactic acid, plasma  Result Value Ref Range   Lactic Acid, Venous 1.9 0.5 - 1.9 mmol/L  Comprehensive metabolic panel  Result Value Ref Range   Sodium 141 135 - 145 mmol/L   Potassium 3.8 3.5 - 5.1 mmol/L   Chloride 111 98 - 111 mmol/L   CO2 22 22 - 32 mmol/L   Glucose, Bld 160 (H) 70 - 99 mg/dL   BUN 18 8 - 23 mg/dL   Creatinine, Ser 1.03 0.61 - 1.24 mg/dL   Calcium 8.4 (L) 8.9 - 10.3 mg/dL   Total Protein 4.7 (L) 6.5 - 8.1 g/dL   Albumin 2.4 (L) 3.5 - 5.0 g/dL   AST 34 15 - 41 U/L   ALT 46 (H) 0 - 44 U/L   Alkaline Phosphatase 92 38 - 126 U/L   Total Bilirubin 0.2 (L) 0.3 - 1.2 mg/dL   GFR calc non Af Amer >60 >60  mL/min   GFR calc Af Amer >60 >60 mL/min   Anion gap 8 5 - 15  CBC WITH DIFFERENTIAL  Result Value Ref Range   WBC 35.0 (H) 4.0 - 10.5 K/uL   RBC 3.72 (L) 4.22 - 5.81 MIL/uL   Hemoglobin 11.1 (L) 13.0 - 17.0 g/dL   HCT 34.5 (L) 39 - 52 %   MCV 92.7 80.0 - 100.0 fL   MCH 29.8 26.0 - 34.0 pg   MCHC 32.2 30.0 - 36.0 g/dL   RDW 17.2 (H) 11.5 - 15.5 %   Platelets 106 (L) 150 - 400 K/uL   nRBC 0.1 0.0 - 0.2 %   Neutrophils Relative % 17 %   Neutro Abs 6.1 1.7 - 7.7 K/uL   Lymphocytes Relative 7 %   Lymphs Abs 2.3 0.7 - 4.0 K/uL   Monocytes Relative 2 %   Monocytes Absolute 0.7 0 - 1 K/uL   Eosinophils Relative 74 %   Eosinophils Absolute 25.7 (H) 0 - 0 K/uL   Basophils Relative 0 %   Basophils Absolute 0.1 0 - 0 K/uL   Immature Granulocytes 0 %   Abs Immature Granulocytes 0.13 (H) 0.00 - 0.07 K/uL  Protime-INR  Result Value Ref Range   Prothrombin Time 15.3 (H) 11.4 - 15.2  seconds   INR 1.3 (H) 0.8 - 1.2  APTT  Result Value Ref Range   aPTT 45 (H) 24 - 36 seconds  Urinalysis, Routine w reflex microscopic Urine, Catheterized  Result Value Ref Range   Color, Urine YELLOW YELLOW   APPearance CLOUDY (A) CLEAR   Specific Gravity, Urine 1.024 1.005 - 1.030   pH 5.0 5.0 - 8.0   Glucose, UA NEGATIVE NEGATIVE mg/dL   Hgb urine dipstick LARGE (A) NEGATIVE   Bilirubin Urine NEGATIVE NEGATIVE   Ketones, ur NEGATIVE NEGATIVE mg/dL   Protein, ur NEGATIVE NEGATIVE mg/dL   Nitrite NEGATIVE NEGATIVE   Leukocytes,Ua NEGATIVE NEGATIVE   RBC / HPF 6-10 0 - 5 RBC/hpf   WBC, UA 6-10 0 - 5 WBC/hpf   Bacteria, UA RARE (A) NONE SEEN   Squamous Epithelial / LPF 0-5 0 - 5   Mucus PRESENT    Hyaline Casts, UA PRESENT    Uric Acid Crys, UA PRESENT   Brain natriuretic peptide  Result Value Ref Range   B Natriuretic Peptide 55.0 0.0 - 100.0 pg/mL  TSH  Result Value Ref Range   TSH 2.008 0.350 - 4.500 uIU/mL   DG Chest Port 1 View  Result Date: 10/05/2019 CLINICAL DATA:  Hypotension. Upper  and lower extremity swelling. Possible sepsis. Ex-smoker. EXAM: PORTABLE CHEST 1 VIEW COMPARISON:  10/02/2019 FINDINGS: Stable enlarged cardiac silhouette, clear lungs and mildly prominent pulmonary vasculature. Thoracic spine degenerative changes. IMPRESSION: Stable cardiomegaly and mild pulmonary vascular congestion. Electronically Signed   By: Claudie Revering M.D.   On: 10/05/2019 19:26   DG Chest Portable 1 View  Result Date: 10/02/2019 CLINICAL DATA:  Leg swelling.  Leg pain. EXAM: PORTABLE CHEST 1 VIEW COMPARISON:  None. FINDINGS: Mild cardiomegaly. Mild peribronchial thickening. No focal airspace disease, pleural effusion or pneumothorax. No acute osseous abnormalities are seen. IMPRESSION: Mild cardiomegaly. Mild peribronchial thickening may be chronic or seen with bronchitis. Electronically Signed   By: Keith Rake M.D.   On: 10/02/2019 23:55     Patient seen by me along with physician assistant.  Patient presenting with vital sign concerns for possible sepsis.  He was hypothermic.  To hypotensive but never tachycardic.  Patient's initial lactic acid was in the upper twos.  Patient received 4 L of fluid but his pressures up into the low 90s.  Lactic acid went down below 2.  Never got tachycardic.  Foley catheter was placed.  He was placed on a warming blanket.  Initial urine was only about 250 cc after receiving per about 3 L of fluid.  Went on to give a fourth.  The fluid challenges of help maintain his blood pressure.  Patient also with marked leukocytosis he was seen Friday bilateral leg swelling and a marked leukocytosis but he does have a history of chronic lymphocytic leukemia.  But his white counts have been under better control.  He is followed by [facility] entirely for this.  Patient without a fever.  Just the hypothermia.  With fluids patient's pressures remained in around 90.  Lactic acid remained good.  Before the end of the shift in the evening we discussed with critical  care specialist.  They agreed to accept him to [facility] because of the soft blood pressures.  Turned over to the overnight physician.  Clinically with observation of him on raising some concern that this may not be sepsis at all something is not adding up.  Patient's blood pressures are soft but remaining  stable patient is alert very comfortable.  Does have edema to upper extremities as well as lower extremities.  Do not feel that patient needs pressure agents at this time.   CRITICAL CARE Performed by: Fredia Sorrow Total critical care time: 90 minutes Critical care time was exclusive of separately billable procedures and treating other patients. Critical care was necessary to treat or prevent imminent or life-threatening deterioration. Critical care was time spent personally by me on the following activities: development of treatment plan with patient and/or surrogate as well as nursing, discussions with consultants, evaluation of patient's response to treatment, examination of patient, obtaining history from patient or surrogate, ordering and performing treatments and interventions, ordering and review of laboratory studies, ordering and review of radiographic studies, pulse oximetry and re-evaluation of patient's condition.      Fredia Sorrow, MD 10/06/19 1555

## 2019-10-05 NOTE — ED Provider Notes (Signed)
[facility] EMERGENCY DEPARTMENT Provider Note   CSN: [id number] Arrival date & time: 10/05/19  1730     History Chief Complaint  Patient presents with  . Hypotension    Cory Lynch is a 74 y.o. male with a past medical history of DM, CLL, Mycosis fungoides primarily treated at [facility] who presents today for evaluation of hypotension.  He reports that he started feeling poorly about 2 PM.  He was found to be hypotensive.  He denies any pain.  He recently started taking Lasix for lower extremity edema.  He denies any wounds.  He denies any abdominal pain, urinary symptoms, cough, chest pain or shortness of breath.  He does not have a port.  He states that he just generally feels bad.  He is vaccinated against Covid however is currently getting chemotherapy.  Chart review from [facility] shows that he last got chemo 09/24/2019.  According to his notes he has been getting gabapentin and occasional Norco for pain in his bilateral upper extremities.   HPI     Past Medical History:  Diagnosis Date  . Diabetes mellitus without complication ([place])   . Hypercholesteremia   . Hypertension   . Hypothyroid   . Mycosis fungoides [facility])     Patient Active Problem List   Diagnosis Date Noted  . Hypotension 10/05/2019  . CLL (chronic lymphocytic leukemia) ([place]) 10/05/2016    Past Surgical History:  Procedure Laterality Date  . CYSTOSCOPY WITH LITHOLAPAXY N/A 09/17/2016   Procedure: CYSTOSCOPY WITH LITHOLAPAXY;  Surgeon: Cleon Gustin, MD;  Location: [facility];  Service: Urology;  Laterality: N/A;  30 MINS [phone] [id number]  . HOLMIUM LASER APPLICATION N/A 5/73/2202   Procedure: HOLMIUM LASER APPLICATION;  Surgeon: Cleon Gustin, MD;  Location: [facility];  Service: Urology;  Laterality: N/A;        Family History  Problem Relation Age of Onset  . Cancer Brother     Social History   Tobacco Use  . Smoking status: Former Smoker    Packs/day: 0.25      Types: Cigarettes    Quit date: 09/14/2013    Years since quitting: 6.0  . Smokeless tobacco: Never Used  Vaping Use  . Vaping Use: Never used  Substance Use Topics  . Alcohol use: No  . Drug use: No    Home Medications Prior to Admission medications   Medication Sig Start Date End Date Taking? Authorizing Provider  furosemide (LASIX) 20 MG tablet Take 0.5 tablets (10 mg total) by mouth daily as needed for edema. 10/03/19   Rancour, Annie Main, MD  hydrOXYzine (ATARAX/VISTARIL) 25 MG tablet Take 1 tablet (25 mg total) by mouth every 6 (six) hours. 01/08/19   Recardo Evangelist, PA-C  metFORMIN (GLUCOPHAGE) 500 MG tablet  01/30/17   [provider]  Omega-3 Fatty Acids (FISH OIL) 1000 MG CAPS Take 1 capsule by mouth daily.    [provider]  tamsulosin (FLOMAX) 0.4 MG CAPS capsule Take 1 capsule (0.4 mg total) by mouth daily after supper. 09/17/16   McKenzie, Candee Furbish, MD  traMADol (ULTRAM) 50 MG tablet Take 1 tablet (50 mg total) by mouth every 6 (six) hours as needed. 04/07/18   Triplett, Tammy, PA-C    Allergies    Patient has no known allergies.  Review of Systems   Review of Systems  Constitutional: Negative for fever.  HENT: Negative for congestion and postnasal drip.   Respiratory: Negative for cough, chest tightness and shortness  of breath.   Gastrointestinal: Negative for abdominal pain, diarrhea, nausea and vomiting.  Genitourinary: Negative for dysuria.  Musculoskeletal: Negative for back pain and neck pain.  Skin: Positive for rash (Chronic, unchanged). Negative for color change.  Allergic/Immunologic: Positive for immunocompromised state.  Neurological: Positive for weakness. Negative for headaches.  Psychiatric/Behavioral: Negative for confusion.  All other systems reviewed and are negative.   Physical Exam Updated Vital Signs BP (!) 99/54   Pulse 85   Temp (!) 94.5 F (34.7 C)   Resp 16   Ht 5\' 4"  (1.626 m)   Wt 101.6 kg   SpO2 97%   BMI  38.45 kg/m   Physical Exam Vitals and nursing note reviewed.  Constitutional:      Appearance: He is well-developed. He is ill-appearing. He is not diaphoretic.  HENT:     Head: Normocephalic and atraumatic.  Eyes:     General: No scleral icterus.       Right eye: No discharge.        Left eye: No discharge.     Conjunctiva/sclera: Conjunctivae normal.  Cardiovascular:     Rate and Rhythm: Normal rate and regular rhythm.     Pulses: Normal pulses.     Heart sounds: Normal heart sounds.  Pulmonary:     Effort: Pulmonary effort is normal. No respiratory distress.     Breath sounds: Normal breath sounds. No stridor.  Abdominal:     General: There is no distension.  Musculoskeletal:        General: No deformity.     Cervical back: Normal range of motion and neck supple.     Right lower leg: Edema present.     Left lower leg: Edema present.  Skin:    General: Skin is warm and dry.  Neurological:     Mental Status: He is alert.     Motor: No abnormal muscle tone.     Comments: Awake and alert.  Slight stutter but able to answer questions appropriately.   Psychiatric:        Mood and Affect: Mood normal.        Behavior: Behavior normal.     ED Results / Procedures / Treatments   Labs (all labs ordered are listed, but only abnormal results are displayed) Labs Reviewed  LACTIC ACID, PLASMA - Abnormal; Notable for the following components:      Result Value   Lactic Acid, Venous 2.7 (*)    All other components within normal limits  COMPREHENSIVE METABOLIC PANEL - Abnormal; Notable for the following components:   Glucose, Bld 160 (*)    Calcium 8.4 (*)    Total Protein 4.7 (*)    Albumin 2.4 (*)    ALT 46 (*)    Total Bilirubin 0.2 (*)    All other components within normal limits  CBC WITH DIFFERENTIAL/PLATELET - Abnormal; Notable for the following components:   WBC 35.0 (*)    RBC 3.72 (*)    Hemoglobin 11.1 (*)    HCT 34.5 (*)    RDW 17.2 (*)    Platelets 106 (*)     Eosinophils Absolute 25.7 (*)    Abs Immature Granulocytes 0.13 (*)    All other components within normal limits  PROTIME-INR - Abnormal; Notable for the following components:   Prothrombin Time 15.3 (*)    INR 1.3 (*)    All other components within normal limits  APTT - Abnormal; Notable for the following components:  aPTT 45 (*)    All other components within normal limits  URINALYSIS, ROUTINE W REFLEX MICROSCOPIC - Abnormal; Notable for the following components:   APPearance CLOUDY (*)    Hgb urine dipstick LARGE (*)    Bacteria, UA RARE (*)    All other components within normal limits  CBC WITH DIFFERENTIAL/PLATELET - Abnormal; Notable for the following components:   WBC 33.5 (*)    RBC 3.31 (*)    Hemoglobin 10.0 (*)    HCT 30.9 (*)    RDW 17.1 (*)    Platelets 104 (*)    Eosinophils Absolute 24.3 (*)    Abs Immature Granulocytes 0.14 (*)    All other components within normal limits  CULTURE, BLOOD (ROUTINE X 2)  SARS CORONAVIRUS 2 BY RT PCR (HOSPITAL ORDER, [facility])  CULTURE, BLOOD (ROUTINE X 2)  URINE CULTURE  LACTIC ACID, PLASMA  BRAIN NATRIURETIC PEPTIDE  TSH  LACTIC ACID, PLASMA  PATHOLOGIST SMEAR REVIEW  LACTIC ACID, PLASMA  LACTIC ACID, PLASMA  LACTIC ACID, PLASMA  LACTIC ACID, PLASMA  LACTIC ACID, PLASMA    EKG EKG Interpretation  Date/Time:  Monday October 05 2019 18:01:49 EDT Ventricular Rate:  61 PR Interval:    QRS Duration: 114 QT Interval:  471 QTC Calculation: 475 R Axis:   31 Text Interpretation: Sinus rhythm Short PR interval Borderline intraventricular conduction delay Borderline T wave abnormalities Confirmed by Fredia Sorrow [phone]) on 10/05/2019 6:33:20 PM   Radiology DG Chest Port 1 View  Result Date: 10/05/2019 CLINICAL DATA:  Hypotension. Upper and lower extremity swelling. Possible sepsis. Ex-smoker. EXAM: PORTABLE CHEST 1 VIEW COMPARISON:  10/02/2019 FINDINGS: Stable enlarged cardiac  silhouette, clear lungs and mildly prominent pulmonary vasculature. Thoracic spine degenerative changes. IMPRESSION: Stable cardiomegaly and mild pulmonary vascular congestion. Electronically Signed   By: Claudie Revering M.D.   On: 10/05/2019 19:26    Procedures .Critical Care Performed by: Lorin Glass, PA-C Authorized by: Lorin Glass, PA-C   Critical care provider statement:    Critical care time (minutes):  45   Critical care time was exclusive of:  Separately billable procedures and treating other patients and teaching time   Critical care was necessary to treat or prevent imminent or life-threatening deterioration of the following conditions:  Sepsis and shock   Critical care was time spent personally by me on the following activities:  Discussions with consultants, evaluation of patient's response to treatment, examination of patient, ordering and performing treatments and interventions, ordering and review of laboratory studies, ordering and review of radiographic studies, pulse oximetry, re-evaluation of patient's condition, obtaining history from patient or surrogate and review of old charts   (including critical care time)  Medications Ordered in ED Medications  lactated ringers infusion ( Intravenous New Bag/Given 10/05/19 1950)  ceFEPIme (MAXIPIME) 2 g in sodium chloride 0.9 % 100 mL IVPB (has no administration in time range)  vancomycin (VANCOREADY) IVPB 1250 mg/250 mL (has no administration in time range)  lactated ringers bolus 1,000 mL (0 mLs Intravenous Stopped 10/05/19 1851)    And  lactated ringers bolus 800 mL (0 mLs Intravenous Stopped 10/05/19 1905)  ceFEPIme (MAXIPIME) 2 g in sodium chloride 0.9 % 100 mL IVPB (0 g Intravenous Stopped 10/05/19 1918)  metroNIDAZOLE (FLAGYL) IVPB 500 mg (0 mg Intravenous Stopped 10/05/19 1951)  vancomycin (VANCOREADY) IVPB 2000 mg/400 mL (0 mg Intravenous Stopped 10/05/19 2117)  lactated ringers bolus 1,000 mL (0 mLs Intravenous  Stopped 10/05/19  2125)  lactated ringers bolus 1,000 mL (0 mLs Intravenous Stopped 10/05/19 2236)    ED Course  I have reviewed the triage vital signs and the nursing notes.  Pertinent labs & imaging results that were available during my care of the patient were reviewed by me and considered in my medical decision making (see chart for details).  Clinical Course as of Oct 06 30  Mon Oct 05, 2019  1822 I have reevaluated patient twice since I first saw him. His blood pressure had briefly dipped to 77 systolic, however on recheck had improved. He has fluid bolus running. Is still mentating okay.   [EH]  1943 WBC(!): 35.0 [EH]  1944 Patient has compelted fluids based on order set for adjusted based on BMI over 35. , still with lower Bps.    [EH]  2128 Patient had about 436ml of urine out.  BNP is ok, will order additional liter of fluids    [EH]  2230 I spoke with Dr. Prudencio Burly of critical care through [facility], he will speak with ICU [facility] prior to accepting.  Per [facility] will put Dr. Collier Bullock (float) for orders.    [EH]  2240 Patient will go on [facility] for [facility] [place].     [EH]  2326 Patient has been admitted by PCCM, when I walked by the room I saw a large area of blood on the floor with coagulated blood, about 500cc of blood.  RN immediately in room, appears IV had disconnected from catheter/extension line.    [EH]    Clinical Course User Index [EH] Ollen Gross   MDM Rules/Calculators/A&P                         Cory Lynch is a 74 year old man who presents today for evaluation of feeling poorly since about 2 PM today.  On arrival he is hypotensive with blood pressure 92/55, markedly hypothermic with rectal temp of 89.3 F (31.8 C) and hypoxic with sats 90-92% on room air.  Code sepsis is called.  Fluid bolus and broad spectrum antibiotics ordered.  He is placed on bair hugger, attempt Foley placed.    CBC shows leukocytosis at 35.  He is given  30/kg fluid bolus.  After that he still has soft blood pressures however has also not made a significant amount of urine.  He is given an additional liter of IV fluids.  His TSH is normal.  From chart review he has no documented history of adrenal insufficiency or steroid use that I saw.  His lactic acid improved with IV fluids.  X-ray shows mild cardiomegaly and pulmonary vascular congestion.  I spoke with critical care Dr. Prudencio Burly.  Per [facility] will list admitting doctor as Collier Bullock MD, critical care night float. Patient will require admission to  for critical care.  Pressors were considered, however given that patient has had poor urine output despite fluids, per Dr. Rogene Houston will hold off on pressors.  His mentation is intact.    No clear infectious source at this time.  He has also not been tachycardic and chart review does not show any beta-blockers or other medications that would mask a tachycardic response.  Patient will be admitted to critical care.   Note: Portions of this report may have been transcribed using voice recognition software. Every effort was made to ensure accuracy; however, inadvertent computerized transcription errors may be present  Final  Clinical Impression(s) / ED Diagnoses Final diagnoses:  Hypotension, unspecified hypotension type  Hypothermia, initial encounter  Hypoxia  Sepsis with acute hypoxic respiratory failure, due to unspecified organism, unspecified whether septic shock present [facility])    Rx / DC Orders ED Discharge Orders    None       Lorin Glass, PA-C 10/06/19 0037    Fredia Sorrow, MD 10/06/19 1556

## 2019-10-05 NOTE — ED Notes (Signed)
Date and time results received: 10/05/19 1948  Test: lactic Critical Value: 2.7  Name of Provider Notified: Benjamine Mola PA  Orders Received? Or Actions Taken?: acknowleged

## 2019-10-05 NOTE — ED Triage Notes (Signed)
Pt brought in by [facility] for c/o weakness with BP 70/38; cbg 246; ems notes pt was cool and pale on initial assessment; pt has had 624ml of NS

## 2019-10-06 ENCOUNTER — Inpatient Hospital Stay (HOSPITAL_COMMUNITY): Payer: Medicare (Managed Care)

## 2019-10-06 ENCOUNTER — Encounter (HOSPITAL_COMMUNITY): Payer: Self-pay | Admitting: Family Medicine

## 2019-10-06 DIAGNOSIS — E039 Hypothyroidism, unspecified: Secondary | ICD-10-CM

## 2019-10-06 DIAGNOSIS — R571 Hypovolemic shock: Secondary | ICD-10-CM

## 2019-10-06 DIAGNOSIS — T50905A Adverse effect of unspecified drugs, medicaments and biological substances, initial encounter: Secondary | ICD-10-CM

## 2019-10-06 DIAGNOSIS — G629 Polyneuropathy, unspecified: Secondary | ICD-10-CM

## 2019-10-06 DIAGNOSIS — C8400 Mycosis fungoides, unspecified site: Secondary | ICD-10-CM | POA: Diagnosis present

## 2019-10-06 DIAGNOSIS — I959 Hypotension, unspecified: Secondary | ICD-10-CM

## 2019-10-06 DIAGNOSIS — R739 Hyperglycemia, unspecified: Secondary | ICD-10-CM

## 2019-10-06 DIAGNOSIS — I9589 Other hypotension: Secondary | ICD-10-CM

## 2019-10-06 DIAGNOSIS — D649 Anemia, unspecified: Secondary | ICD-10-CM

## 2019-10-06 DIAGNOSIS — D72829 Elevated white blood cell count, unspecified: Secondary | ICD-10-CM | POA: Diagnosis present

## 2019-10-06 DIAGNOSIS — G4700 Insomnia, unspecified: Secondary | ICD-10-CM | POA: Diagnosis present

## 2019-10-06 DIAGNOSIS — E872 Acidosis, unspecified: Secondary | ICD-10-CM | POA: Diagnosis present

## 2019-10-06 DIAGNOSIS — E119 Type 2 diabetes mellitus without complications: Secondary | ICD-10-CM

## 2019-10-06 DIAGNOSIS — D759 Disease of blood and blood-forming organs, unspecified: Secondary | ICD-10-CM | POA: Diagnosis present

## 2019-10-06 DIAGNOSIS — T68XXXA Hypothermia, initial encounter: Secondary | ICD-10-CM | POA: Diagnosis present

## 2019-10-06 DIAGNOSIS — E7800 Pure hypercholesterolemia, unspecified: Secondary | ICD-10-CM | POA: Diagnosis present

## 2019-10-06 DIAGNOSIS — E781 Pure hyperglyceridemia: Secondary | ICD-10-CM

## 2019-10-06 DIAGNOSIS — D696 Thrombocytopenia, unspecified: Secondary | ICD-10-CM | POA: Diagnosis present

## 2019-10-06 DIAGNOSIS — I10 Essential (primary) hypertension: Secondary | ICD-10-CM | POA: Diagnosis present

## 2019-10-06 LAB — CBC WITH DIFFERENTIAL/PLATELET
Abs Immature Granulocytes: 0.14 10*3/uL — ABNORMAL HIGH (ref 0.00–0.07)
Abs Immature Granulocytes: 0.18 10*3/uL — ABNORMAL HIGH (ref 0.00–0.07)
Basophils Absolute: 0.1 10*3/uL (ref 0.0–0.1)
Basophils Absolute: 0.1 10*3/uL (ref 0.0–0.1)
Basophils Relative: 0 %
Basophils Relative: 0 %
Eosinophils Absolute: 24.3 10*3/uL — ABNORMAL HIGH (ref 0.0–0.5)
Eosinophils Absolute: 24.4 10*3/uL — ABNORMAL HIGH (ref 0.0–0.5)
Eosinophils Relative: 73 %
Eosinophils Relative: 71 %
HCT: 30.9 % — ABNORMAL LOW (ref 39.0–52.0)
HCT: 31.5 % — ABNORMAL LOW (ref 39.0–52.0)
Hemoglobin: 10 g/dL — ABNORMAL LOW (ref 13.0–17.0)
Hemoglobin: 10 g/dL — ABNORMAL LOW (ref 13.0–17.0)
Immature Granulocytes: 0 %
Immature Granulocytes: 1 %
Lymphocytes Relative: 7 %
Lymphocytes Relative: 6 %
Lymphs Abs: 2.5 10*3/uL (ref 0.7–4.0)
Lymphs Abs: 1.9 10*3/uL (ref 0.7–4.0)
MCH: 30.2 pg (ref 26.0–34.0)
MCH: 29.8 pg (ref 26.0–34.0)
MCHC: 32.4 g/dL (ref 30.0–36.0)
MCHC: 31.7 g/dL (ref 30.0–36.0)
MCV: 93.4 fL (ref 80.0–100.0)
MCV: 93.8 fL (ref 80.0–100.0)
Monocytes Absolute: 0.7 10*3/uL (ref 0.1–1.0)
Monocytes Absolute: 2.1 10*3/uL — ABNORMAL HIGH (ref 0.1–1.0)
Monocytes Relative: 2 %
Monocytes Relative: 6 %
Neutro Abs: 5.9 10*3/uL (ref 1.7–7.7)
Neutro Abs: 5.5 10*3/uL (ref 1.7–7.7)
Neutrophils Relative %: 18 %
Neutrophils Relative %: 16 %
Platelets: 104 10*3/uL — ABNORMAL LOW (ref 150–400)
Platelets: 105 10*3/uL — ABNORMAL LOW (ref 150–400)
RBC: 3.31 MIL/uL — ABNORMAL LOW (ref 4.22–5.81)
RBC: 3.36 MIL/uL — ABNORMAL LOW (ref 4.22–5.81)
RDW: 17.1 % — ABNORMAL HIGH (ref 11.5–15.5)
RDW: 17.2 % — ABNORMAL HIGH (ref 11.5–15.5)
WBC: 33.5 10*3/uL — ABNORMAL HIGH (ref 4.0–10.5)
WBC: 34.2 10*3/uL — ABNORMAL HIGH (ref 4.0–10.5)
nRBC: 0.1 % (ref 0.0–0.2)
nRBC: 0.2 % (ref 0.0–0.2)

## 2019-10-06 LAB — LACTIC ACID, PLASMA
Lactic Acid, Venous: 1.5 mmol/L (ref 0.5–1.9)
Lactic Acid, Venous: 1.5 mmol/L (ref 0.5–1.9)

## 2019-10-06 LAB — ECHOCARDIOGRAM COMPLETE
Area-P 1/2: 5.16 cm2
Height: 64 in
S' Lateral: 3.06 cm
Weight: 3583.8 oz

## 2019-10-06 LAB — HEMOGLOBIN A1C
Hgb A1c MFr Bld: 6.9 % — ABNORMAL HIGH (ref 4.8–5.6)
Mean Plasma Glucose: 151.33 mg/dL

## 2019-10-06 LAB — GLUCOSE, CAPILLARY: Glucose-Capillary: 81 mg/dL (ref 70–99)

## 2019-10-06 LAB — CORTISOL: Cortisol, Plasma: 15.2 ug/dL

## 2019-10-06 LAB — PROCALCITONIN: Procalcitonin: 0.11 ng/mL

## 2019-10-06 LAB — CBG MONITORING, ED
Glucose-Capillary: 119 mg/dL — ABNORMAL HIGH (ref 70–99)
Glucose-Capillary: 119 mg/dL — ABNORMAL HIGH (ref 70–99)

## 2019-10-06 MED ORDER — LACTATED RINGERS IV SOLN: INTRAVENOUS | Status: DC

## 2019-10-06 MED ORDER — BISACODYL 5 MG PO TBEC: 5.0000 mg | DELAYED_RELEASE_TABLET | Freq: Every day | ORAL | Status: DC | PRN

## 2019-10-06 MED ORDER — INSULIN ASPART 100 UNIT/ML ~~LOC~~ SOLN: 0.0000 [IU] | Freq: Every day | SUBCUTANEOUS | Status: DC

## 2019-10-06 MED ORDER — ACETAMINOPHEN 325 MG PO TABS: 650.0000 mg | ORAL_TABLET | Freq: Four times a day (QID) | ORAL | Status: DC | PRN

## 2019-10-06 MED ORDER — PANTOPRAZOLE SODIUM 40 MG PO TBEC
40.0000 mg | DELAYED_RELEASE_TABLET | Freq: Every day | ORAL | Status: DC
  Administered 2019-10-06 – 2019-10-09 (×4): 40 mg via ORAL
  Filled 2019-10-06 (×4): qty 1

## 2019-10-06 MED ORDER — ACETAMINOPHEN 650 MG RE SUPP: 650.0000 mg | Freq: Four times a day (QID) | RECTAL | Status: DC | PRN

## 2019-10-06 MED ORDER — TAMSULOSIN HCL 0.4 MG PO CAPS
0.4000 mg | ORAL_CAPSULE | Freq: Every day | ORAL | Status: DC
  Administered 2019-10-06: 0.4 mg via ORAL
  Filled 2019-10-06: qty 1

## 2019-10-06 MED ORDER — ONDANSETRON HCL 4 MG PO TABS: 4.0000 mg | ORAL_TABLET | Freq: Four times a day (QID) | ORAL | Status: DC | PRN

## 2019-10-06 MED ORDER — INSULIN ASPART 100 UNIT/ML ~~LOC~~ SOLN
3.0000 [IU] | Freq: Three times a day (TID) | SUBCUTANEOUS | Status: DC
  Administered 2019-10-06 – 2019-10-08 (×4): 3 [IU] via SUBCUTANEOUS
  Filled 2019-10-06: qty 1

## 2019-10-06 MED ORDER — METRONIDAZOLE IN NACL 5-0.79 MG/ML-% IV SOLN
500.0000 mg | Freq: Three times a day (TID) | INTRAVENOUS | Status: DC
  Administered 2019-10-06 – 2019-10-07 (×3): 500 mg via INTRAVENOUS
  Filled 2019-10-06 (×3): qty 100

## 2019-10-06 MED ORDER — GABAPENTIN 300 MG PO CAPS: 600.0000 mg | ORAL_CAPSULE | Freq: Three times a day (TID) | ORAL | Status: DC | PRN

## 2019-10-06 MED ORDER — GABAPENTIN 300 MG PO CAPS: 600.0000 mg | ORAL_CAPSULE | Freq: Three times a day (TID) | ORAL | Status: DC

## 2019-10-06 MED ORDER — OMEGA-3-ACID ETHYL ESTERS 1 G PO CAPS
1.0000 g | ORAL_CAPSULE | Freq: Every day | ORAL | Status: DC
  Administered 2019-10-07 – 2019-10-09 (×3): 1 g via ORAL
  Filled 2019-10-06 (×6): qty 1

## 2019-10-06 MED ORDER — ASPIRIN EC 81 MG PO TBEC
81.0000 mg | DELAYED_RELEASE_TABLET | Freq: Every day | ORAL | Status: DC
  Administered 2019-10-06 – 2019-10-09 (×4): 81 mg via ORAL
  Filled 2019-10-06 (×4): qty 1

## 2019-10-06 MED ORDER — ONDANSETRON HCL 4 MG/2ML IJ SOLN: 4.0000 mg | Freq: Four times a day (QID) | INTRAMUSCULAR | Status: DC | PRN

## 2019-10-06 MED ORDER — INSULIN ASPART 100 UNIT/ML ~~LOC~~ SOLN
0.0000 [IU] | Freq: Three times a day (TID) | SUBCUTANEOUS | Status: DC
  Administered 2019-10-07 – 2019-10-08 (×2): 2 [IU] via SUBCUTANEOUS

## 2019-10-06 MED ORDER — GABAPENTIN 600 MG PO TABS
600.0000 mg | ORAL_TABLET | Freq: Three times a day (TID) | ORAL | Status: DC
  Filled 2019-10-06 (×9): qty 1

## 2019-10-06 MED ORDER — ENOXAPARIN SODIUM 40 MG/0.4ML ~~LOC~~ SOLN
40.0000 mg | SUBCUTANEOUS | Status: DC
  Administered 2019-10-06: 40 mg via SUBCUTANEOUS
  Filled 2019-10-06: qty 0.4

## 2019-10-06 MED ORDER — ZOLPIDEM TARTRATE 5 MG PO TABS
5.0000 mg | ORAL_TABLET | Freq: Every evening | ORAL | Status: DC | PRN
  Administered 2019-10-07 – 2019-10-08 (×2): 5 mg via ORAL
  Filled 2019-10-06 (×2): qty 1

## 2019-10-06 MED ORDER — LEVOTHYROXINE SODIUM 125 MCG PO TABS
125.0000 ug | ORAL_TABLET | Freq: Every day | ORAL | Status: DC
  Administered 2019-10-07 – 2019-10-09 (×3): 125 ug via ORAL
  Filled 2019-10-06 (×3): qty 1

## 2019-10-06 MED ORDER — CHLORHEXIDINE GLUCONATE CLOTH 2 % EX PADS
6.0000 | MEDICATED_PAD | Freq: Every day | CUTANEOUS | Status: DC
  Administered 2019-10-06 – 2019-10-09 (×4): 6 via TOPICAL

## 2019-10-06 MED ORDER — LORATADINE 10 MG PO TABS: 10.0000 mg | ORAL_TABLET | Freq: Every day | ORAL | Status: DC | PRN

## 2019-10-06 MED ORDER — HYDROCODONE-ACETAMINOPHEN 5-325 MG PO TABS: 1.0000 | ORAL_TABLET | Freq: Two times a day (BID) | ORAL | Status: DC | PRN

## 2019-10-06 NOTE — ED Notes (Signed)
Bear hugger removed from pt, his temp is 98.6.

## 2019-10-06 NOTE — Progress Notes (Signed)
*  PRELIMINARY RESULTS* Echocardiogram 2D Echocardiogram has been performed.  Leavy Cella 10/06/2019, 11:26 AM

## 2019-10-06 NOTE — H&P (Signed)
History and Physical  [place] NLZ:[id number] DOB: [birth year] DOA: 10/05/2019  PCP: [facility]  Patient coming from: Home   I have personally briefly reviewed patient's old medical records in [place]  Chief Complaint: weakness  HPI: [name] is a 74 y.o. male with medical history significant for mycosis fungoides with folliculotropic infiltrates in patch-plaque lesions as well as more infiltrative tumors and nodules most prominent on the bilateral upper extremities.  He is being treated for this by [facility] oncology with chemotherapy.  He started gemcitabine in August 2021.  He has received at least 2 treatments. 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   He was seen last week in the ER with complaints of several weeks of leg swelling.  He was prescribed lasix and compression stockings.  He says that he has been taking the lasix regularly.    He presented to ED by EMS on 9/6 with complaints of weakness.  He was noted to be hypotensive with BP 70/38.  He was cool and found to be hypothermic.  He was initially treated presumptively for a septic shock and started on IV antibiotics and received 4 liters of IV fluids.  He was accepted to be admitted to the ICU service at [facility].  While holding in the ED it was noted that his BP stabilized after IV fluids and he never required pressors.  He was placed on a bair hugger and his temperature started to improve.  His lactic acid was initially elevated at 2.7 but them quickly normalized after IV fluids and has remained normal.   He was noted to have marked leukocytosis but with his history of lymphocytosis and possibly CLL (he has not had a biopsy done to confirm CLL) he has a chronic leukocytosis and the clinical significance is difficult to determine.   He was evaluated by Dr. Melvyn Novas on 9/7 with  pulmonary critical care.  He requested a procalcitonin that was 0.11 and cortisol.  He requested a 2D echocardiogram to rule out pericardial effusion.  He felt that patient could be managed at [facility] and transfer to [facility] was cancelled.    The patient is being admitted for hypovolemic shock likely secondary to over diuresis and he will be admitted to stepdown ICU.  No source of infection has been found and sepsis has been ruled out.   Review of Systems: As per HPI otherwise 10 point review of systems negative.   Past Medical History:  Diagnosis Date  . Diabetes mellitus without complication ([place])   . Hypercholesteremia   . Hypertension   . Hypothyroid   . Mycosis fungoides ([place])     Past Surgical History:  Procedure Laterality Date  . CYSTOSCOPY WITH LITHOLAPAXY N/A 09/17/2016   Procedure: CYSTOSCOPY WITH LITHOLAPAXY;  Surgeon: Cleon Gustin, MD;  Location: [facility] ORS;  Service: Urology;  Laterality: N/A;  30 MINS [phone] [id number]  . HOLMIUM LASER APPLICATION N/A 1/85/6314   Procedure: HOLMIUM LASER APPLICATION;  Surgeon: Cleon Gustin, MD;  Location: [facility] ORS;  Service: Urology;  Laterality: N/A;     reports that he quit smoking about 6 years ago. His smoking use included cigarettes. He smoked 0.25 packs per day. He has never used smokeless tobacco. He reports that he does not drink alcohol and does not use drugs.  No Known Allergies  Family History  Problem Relation  Age of Onset  . Cancer Brother     Prior to Admission medications   Medication Sig Start Date End Date Taking? Authorizing Provider  aspirin EC 81 MG tablet Take 81 mg by mouth daily. Swallow whole.   Yes [provider]  cetirizine (ZYRTEC) 10 MG tablet Take 1 tablet by mouth daily. 05/14/19 05/13/20 Yes [provider]  furosemide (LASIX) 20 MG tablet Take 0.5 tablets (10 mg total) by mouth daily as needed for edema. 10/03/19  Yes Rancour, Annie Main,  MD  gabapentin (NEURONTIN) 600 MG tablet Take 600 mg by mouth 3 (three) times daily. 09/21/19  Yes [provider]  HYDROcodone-acetaminophen (NORCO/VICODIN) 5-325 MG tablet Take 1 tablet by mouth 2 (two) times daily as needed. 09/21/19  Yes [provider]  levothyroxine (SYNTHROID) 125 MCG tablet Take 125 mcg by mouth daily. 08/20/19  Yes [provider]  Omega-3 Fatty Acids (FISH OIL) 1000 MG CAPS Take 1 capsule by mouth daily.   Yes [provider]  tamsulosin (FLOMAX) 0.4 MG CAPS capsule Take 1 capsule (0.4 mg total) by mouth daily after supper. 09/17/16  Yes McKenzie, Candee Furbish, MD  traMADol (ULTRAM) 50 MG tablet Take 1 tablet (50 mg total) by mouth every 6 (six) hours as needed. 04/07/18  Yes Triplett, Tammy, PA-C  zolpidem (AMBIEN) 5 MG tablet Take 5 mg by mouth at bedtime as needed. 09/08/19  Yes [provider]    Physical Exam: Vitals:   10/06/19 0630 10/06/19 1430 10/06/19 1500 10/06/19 1530  BP: (!) 97/52 (!) 97/47 (!) 97/55 (!) 95/55  Pulse: 97 89 88 80  Resp:      Temp: 98.2 F (36.8 C) (!) 95.4 F (35.2 C) (!) 95.2 F (35.1 C) (!) 95 F (35 C)  TempSrc:      SpO2: 100% 100% 100% 100%  Weight:      Height:       Constitutional: chronically ill appearing male, awake, alert, oriented x3, NAD, calm, comfortable Eyes: PERRL, lids and conjunctivae normal ENMT: Mucous membranes are moist. Posterior pharynx clear of any exudate or lesions. Edentulous.  Neck: normal, supple, no masses, no thyromegaly Respiratory: clear to auscultation bilaterally, no wheezing, no crackles. Normal respiratory effort. No accessory muscle use.  Cardiovascular: normal s1, s2 sounds, no murmurs / rubs / gallops. 2+  extremity edema upper and lower. 2+ pedal pulses. No carotid bruits.  Abdomen: no tenderness, no masses palpated. No hepatosplenomegaly. Bowel sounds positive.  Musculoskeletal: no clubbing / cyanosis. No joint deformity upper and lower extremities.  Good ROM, no contractures. Normal muscle tone.  Skin: diffuse tumor lesions noted on both upper extremities, diffuse swollen hands with difficult grip due to edema Neurologic: CN 2-12 grossly intact. Sensation intact, DTR normal. Strength 5/5 in all 4.  Psychiatric: Normal judgment and insight. Alert and oriented x 3. Normal mood.   Labs on Admission: I have personally reviewed following labs and imaging studies  CBC: Recent Labs  Lab 10/02/19 2214 10/05/19 1841 10/05/19 2317 10/06/19 0254  WBC 22.5* 35.0* 33.5* 34.2*  NEUTROABS 2.6 6.1 5.9 5.5  HGB 12.8* 11.1* 10.0* 10.0*  HCT 40.8 34.5* 30.9* 31.5*  MCV 93.4 92.7 93.4 93.8  PLT 205 106* 104* 381*   Basic Metabolic Panel: Recent Labs  Lab 10/02/19 2214 10/05/19 1841  NA 142 141  K 3.7 3.8  CL 109 111  CO2 22 22  GLUCOSE 174* 160*  BUN 13 18  CREATININE 0.93 1.03  CALCIUM 9.1 8.4*  GFR: Estimated Creatinine Clearance: 69.9 mL/min (by C-G formula based on SCr of 1.03 mg/dL). Liver Function Tests: Recent Labs  Lab 10/02/19 2214 10/05/19 1841  AST 20 34  ALT 36 46*  ALKPHOS 126 92  BILITOT 0.2* 0.2*  PROT 6.9 4.7*  ALBUMIN 3.4* 2.4*   No results for input(s): LIPASE, AMYLASE in the last 168 hours. No results for input(s): AMMONIA in the last 168 hours. Coagulation Profile: Recent Labs  Lab 10/05/19 1841  INR 1.3*   Cardiac Enzymes: No results for input(s): CKTOTAL, CKMB, CKMBINDEX, TROPONINI in the last 168 hours. BNP (last 3 results) No results for input(s): PROBNP in the last 8760 hours. HbA1C: No results for input(s): HGBA1C in the last 72 hours. CBG: Recent Labs  Lab 10/02/19 2330  GLUCAP 202*   Lipid Profile: No results for input(s): CHOL, HDL, LDLCALC, TRIG, CHOLHDL, LDLDIRECT in the last 72 hours. Thyroid Function Tests: Recent Labs    10/05/19 1841  TSH 2.008   Anemia Panel: No results for input(s): VITAMINB12, FOLATE, FERRITIN, TIBC, IRON, RETICCTPCT in the last 72 hours. Urine  analysis:    Component Value Date/Time   COLORURINE YELLOW 10/05/2019 2110   APPEARANCEUR CLOUDY (A) 10/05/2019 2110   LABSPEC 1.024 10/05/2019 2110   PHURINE 5.0 10/05/2019 2110   GLUCOSEU NEGATIVE 10/05/2019 2110   HGBUR LARGE (A) 10/05/2019 2110   [place] NEGATIVE 10/05/2019 2110   [place] NEGATIVE 10/05/2019 2110   PROTEINUR NEGATIVE 10/05/2019 2110   NITRITE NEGATIVE 10/05/2019 2110   LEUKOCYTESUR NEGATIVE 10/05/2019 2110    Radiological Exams on Admission: DG Chest Port 1 View  Result Date: 10/05/2019 CLINICAL DATA:  Hypotension. Upper and lower extremity swelling. Possible sepsis. Ex-smoker. EXAM: PORTABLE CHEST 1 VIEW COMPARISON:  10/02/2019 FINDINGS: Stable enlarged cardiac silhouette, clear lungs and mildly prominent pulmonary vasculature. Thoracic spine degenerative changes. IMPRESSION: Stable cardiomegaly and mild pulmonary vascular congestion. Electronically Signed   By: Claudie Revering M.D.   On: 10/05/2019 19:26   ECHOCARDIOGRAM COMPLETE  Result Date: 10/06/2019    ECHOCARDIOGRAM REPORT   Patient Name:   Cory Lynch Date of Exam: 10/06/2019 Medical Rec #:  [id number]       Height:       64.0 in Accession #:    [id number]      Weight:       224.0 lb Date of Birth:  07/01/1945        BSA:          2.053 m Patient Age:    62 years        BP:           97/52 mmHg Patient Gender: M               HR:           97 bpm. Exam Location:  [facility] Procedure: 2D Echo Indications:    Hypotension [[id number]  History:        Patient has no prior history of Echocardiogram examinations.                 Risk Factors:Diabetes, Hypertension, Dyslipidemia and Former                 Smoker. Hypotension.  Sonographer:    Leavy Cella RDCS (AE) Referring Phys: [place]  1. Left ventricular ejection fraction, by estimation, is 65 to 70%. The left ventricle has normal function. The left ventricle has no regional wall motion abnormalities. Left ventricular  diastolic parameters are  indeterminate.  2. Right ventricular systolic function is normal. The right ventricular size is normal. There is normal pulmonary artery systolic pressure. The estimated right ventricular systolic pressure is 61.4 mmHg.  3. The mitral valve is grossly normal. Trivial mitral valve regurgitation.  4. The aortic valve is tricuspid. Aortic valve regurgitation is not visualized.  5. The inferior vena cava is normal in size with greater than 50% respiratory variability, suggesting right atrial pressure of 3 mmHg. FINDINGS  Left Ventricle: Left ventricular ejection fraction, by estimation, is 65 to 70%. The left ventricle has normal function. The left ventricle has no regional wall motion abnormalities. The left ventricular internal cavity size was normal in size. There is  borderline left ventricular hypertrophy. Left ventricular diastolic parameters are indeterminate. Right Ventricle: The right ventricular size is normal. No increase in right ventricular wall thickness. Right ventricular systolic function is normal. There is normal pulmonary artery systolic pressure. The tricuspid regurgitant velocity is 2.49 m/s, and  with an assumed right atrial pressure of 3 mmHg, the estimated right ventricular systolic pressure is 43.1 mmHg. Left Atrium: Left atrial size was normal in size. Right Atrium: Right atrial size was normal in size. Pericardium: There is no evidence of pericardial effusion. Presence of pericardial fat pad. Mitral Valve: The mitral valve is grossly normal. Trivial mitral valve regurgitation. Tricuspid Valve: The tricuspid valve is grossly normal. Tricuspid valve regurgitation is trivial. Aortic Valve: The aortic valve is tricuspid. Aortic valve regurgitation is not visualized. Mild aortic valve annular calcification. Pulmonic Valve: The pulmonic valve was grossly normal. Pulmonic valve regurgitation is trivial. Aorta: The aortic root is normal in size and structure. Venous: The inferior vena cava is normal  in size with greater than 50% respiratory variability, suggesting right atrial pressure of 3 mmHg. IAS/Shunts: No atrial level shunt detected by color flow Doppler.  LEFT VENTRICLE PLAX 2D LVIDd:         4.90 cm  Diastology LVIDs:         3.06 cm  LV e' lateral:   8.81 cm/s LV PW:         1.14 cm  LV E/e' lateral: 11.3 LV IVS:        1.04 cm  LV e' medial:    6.74 cm/s LVOT diam:     1.80 cm  LV E/e' medial:  14.7 LVOT Area:     2.54 cm  RIGHT VENTRICLE RV S prime:     14.00 cm/s TAPSE (M-mode): 3.7 cm LEFT ATRIUM             Index       RIGHT ATRIUM           Index LA diam:        3.40 cm 1.66 cm/m  RA Area:     11.20 cm LA Vol (A2C):   75.4 ml 36.73 ml/m RA Volume:   21.80 ml  10.62 ml/m LA Vol (A4C):   37.1 ml 18.07 ml/m LA Biplane Vol: 55.3 ml 26.94 ml/m   AORTA Ao Root diam: 3.00 cm MITRAL VALVE               TRICUSPID VALVE MV Area (PHT): 5.16 cm    TR Peak grad:   24.8 mmHg MV Decel Time: 147 msec    TR Vmax:        249.00 cm/s MV E velocity: 99.40 cm/s MV A velocity: 76.70 cm/s  SHUNTS MV E/A ratio:  1.30  Systemic Diam: 1.80 cm Rozann Lesches MD Electronically signed by Rozann Lesches MD Signature Date/Time: 10/06/2019/11:31:33 AM    Final    EKG: Independently reviewed.   Assessment/Plan Principal Problem:   Hypovolemic shock (HCC) Active Problems:   Hypotension   Hypothermia   Leukocytosis   Mycosis fungoides (HCC)   Hypertension   Hypercholesteremia   Diabetes mellitus without complication (HCC)   Hypothyroid   Anemia, unspecified   Lactic acidosis   Hypertriglyceridemia   Drug-induced cytopenia   Hyperglycemia   Thrombocytopenia (HCC)   Peripheral neuropathy   Insomnia   1. Hypovolemic shock - secondary to diuresis with lasix which is being held now. BP started to improve after 4L saline was given. Pressor were not required.  Pt was initially treated empirically for sepsis but after work up completed and conference with pccm Dr. Melvyn Novas, sepsis was ruled out.   Continue IV fluid for now. Follow closely in stepdown ICU.  Follow cultures.  2. Hypothermia - Pt being treated with a bair hugger with good results.  Follow temps.  3. Mycosis fungoides - Pt is being at [facility] oncology and was due to be seen on 10/08/19. He is actively being treated with chemotherapy.  4. Drug induced hypothyroidism - resumed synthroid.  5. Monoclonal B cell lymphocytosis - He likely does have CLL but has not had node biopsy done to confirm. Follow up with [facility] oncology.  They noted that even if CLL were present no treatment would be indicated at this time.   6. Type 2 DM with Hyperglycemia - glycemic control orderset bundle started.  7. Peripheral neuropathy - gabapentin ordered as needed.  8. Lactic acidosis - resolved with IV fluids.  9. Thrombocytopenia - chemotherapy induced - follow.  10. Hypertriglyceridemia - resume home meds.  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  12. Chronic insomnia -resume home meds.   DVT prophylaxis: enoxaparin  Code Status: full   Disposition Plan: home   Consults called: pccm   Admission status: INP   Critical Care Procedure Note Authorized and Performed by: Murvin Natal MD  Total Critical Care time:  65 mins Due to a high probability of clinically significant, life threatening deterioration, the patient required my highest level of preparedness to intervene emergently and I personally spent this critical care time directly and personally managing the patient.  This critical care time included obtaining a history; examining the patient, pulse oximetry; ordering and review of studies; arranging urgent treatment with development of a management plan; evaluation of patient's response of treatment; frequent reassessment; and discussions with other providers.  This critical care time was performed to assess and manage the high probability of imminent and life threatening deterioration that could result in multi-organ failure.  It was  exclusive of separately billable procedures and treating other patients and teaching time.    Irwin Brakeman MD [facility] How to contact the [facility] Attending or Consulting provider [place] or covering provider during after hours [employer], for this patient?  1. Check the care team in [facility] and look for a) attending/consulting [facility] provider listed and b) the [facility] team listed 2. Log into [email or web address] and use 's universal password to access. If you do not have the password, please contact the hospital operator. 3. Locate the [facility] provider you are looking for under [facility] and page to a number that you can be directly reached. 4. If you still have difficulty reaching the provider, please page the [facility] (  Director on Call) for the Hospitalists listed on amion for assistance.   If 7PM-7AM, please contact night-coverage [email or web address] Password [email or web address]  10/06/2019, 5:10 PM

## 2019-10-06 NOTE — ED Provider Notes (Signed)
Patient waiting for bed at [facility].  He presented with hypotension, hypothermia.  He received 4 L of IV fluids.  His temperature has been improving.  Evidently he had a IV that had become disconnected and he had a large pool of blood on the floor.  Repeat CBC was ordered at 1 AM, however I do not see where it was done there was one done at 11:45 PM.  4:00 AM blood pressure 96/52, heart rate 99, pulse ox 98%, respiratory rate 20, temperature 98.6.   CBC  CBC Latest Ref Rng & Units 10/06/2019 10/05/2019 10/05/2019  WBC 4.0 - 10.5 K/uL 34.2(H) 33.5(H) 35.0(H)  Hemoglobin 13.0 - 17.0 g/dL 10.0(L) 10.0(L) 11.1(L)  Hematocrit 39 - 52 % 31.5(L) 30.9(L) 34.5(L)  Platelets 150 - 400 K/uL 105(L) 104(L) 106(L)   Patient's hemoglobin has remained stable at 10.  5:30 AM I am temperature 99.8, pulse 95 respirations 13, blood pressure 109/50, pulse ox 98%. Patient still waiting on a bed to be available at [facility], ICU.   Rolland Porter, MD 10/06/19 [phone]

## 2019-10-06 NOTE — Consult Note (Signed)
NAME:  Cory Lynch, MRN:  [id number], DOB:  [birth year], LOS: 1 ADMISSION DATE:  10/05/2019, CONSULTATION DATE:  9/7  REFERRING MD:  K[place]ut/ EDP, CHIEF COMPLAINT:  Weak    Brief History   9 yobm former smoker rx for T cell lymphoma by [facility] last dose gemcitabline 8/26 with progressive leg swelling since then rx with Lasix then returned 9/6 with weakness and found to be hypotensive and hypothemic and assumed septic but no source identified so PCCM service consulted at that point with plans ? tx to [facility]  History of present illness   74 y.o. male with medical history significant for mycosis fungoides with folliculotropic infiltrates in patch-plaque lesions as well as more infiltrative tumors and nodules most prominent on the bilateral upper extremities.  He is being treated for this by [facility] oncology with chemotherapy.  He started gemcitabine in August 2021.  He has received at least 2 treatments. 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   He was seen last week in the ER with complaints of several weeks of leg swelling.  He was prescribed lasix and compression stockings.  He says that he has been taking the lasix regularly.    He presented to ED by EMS on 9/6 with complaints of weakness.  He was noted to be hypotensive with BP 70/38.  He was cool and found to be hypothermic.  He was initially treated presumptively for a septic shock and started on IV antibiotics and received 4 liters of IV fluids.  He was accepted to be admitted to the ICU service at [facility].  While holding in the ED it was noted that his BP stabilized after IV fluids and he never required pressors.  He was placed on a bair hugger and his temperature started to improve.  His lactic acid was initially elevated at 2.7 but them quickly normalized after IV fluids and has remained normal.   He was noted to have marked leukocytosis but with  his history of lymphocytosis and possibly CLL (he has not had a biopsy done to confirm CLL) he has a chronic leukocytosis and the clinical significance is difficult to determine.    The patient is being admitted for hypovolemic shock likely secondary to over diuresis and he will be admitted to stepdown ICU.  No source of infection has been found and sepsis has been ruled out.    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 aspirin EC  81 mg Oral Daily  . Chlorhexidine Gluconate Cloth  6 each Topical Daily  . enoxaparin (LOVENOX) injection  40 mg Subcutaneous Q24H  . insulin aspart  0-15 Units Subcutaneous TID WC  . insulin aspart  0-5 Units Subcutaneous QHS  . insulin aspart  3 Units Subcutaneous TID WC  . levothyroxine  125 mcg Oral Daily  . [START ON 10/07/2019] omega-3 acid  ethyl esters  1 g Oral Daily  . pantoprazole  40 mg Oral Q0600   Continuous Infusions: . ceFEPime (MAXIPIME) IV 2 g (10/06/19 1316)  . lactated ringers 100 mL/hr at 10/06/19 1421  . metronidazole Stopped (10/06/19 1654)  . vancomycin Stopped (10/06/19 0735)   PRN Meds:.acetaminophen **OR** acetaminophen, bisacodyl, gabapentin, HYDROcodone-acetaminophen, loratadine, ondansetron **OR** ondansetron (ZOFRAN) IV, zolpidem  Interim history/subjective:  Feeling better p fluids and wants ot eat / drinl  Objective   Blood pressure (!) 110/46, pulse 88, temperature (!) 96.2 F (35.7 C), temperature source Rectal, resp. rate 10, height 5\' 4"  (1.626 m), weight 101.6 kg, SpO2 99 %.        Intake/Output  Summary (Last 24 hours) at 10/06/2019 1948 Last data filed at 10/06/2019 1900 Gross per 24 hour  Intake 2216.69 ml  Output --  Net 2216.69 ml   Filed Weights   10/05/19 1754 10/05/19 1803  Weight: 101.6 kg 101.6 kg    Examination: Tmax  99  Chronically ill bm in trendelenburg position req food, drink RA  HENT: mucosa dry Lungs: c.ear to and P  Cardiovascular: nl s1s2 no m g or rub or increase P2 Abdomen: mod distended, soft s tenderness/ rebound / masses or HSM  Extremities: warm trace edema/ nodular rash L forearm Neuro: sensorium intact, no motor deficits     I personally reviewed images and agree with radiology impression as follows:  CXR:   10/05/19  Stable cardiomegaly and mild pulmonary vascular congestion    Resolved Hospital Problem list      Assessment & Plan:    1) Circulatory shock with relatively low cvp by echo, good LV and RV and high uop with blood sugars not in glycosuric range typical of hypoadrenal status / ? Neuropathy contributing  - check cortisol level pending - hold all antihypertensives including flomax / lasix  - ok to give additional volume if needed  - add Hydrocotisone now that cortisol level drawn  - watch serial PCTs though no source sepsis identified  2) Lymphoma T cell s/p chemo 09/24/19 at [facility]  - pulmonary toxicity greatest concern but  None apparent at present as comfortable with good sats on RA even p 4 liters  3 Mild thrombocytopenia/ anemia  likely due to chemo/lymphoma   4) peripheral edema likely due to low albumin as echo ok/ creatinine ok     Labs   CBC: Recent Labs  Lab 10/02/19 2214 10/05/19 1841 10/05/19 2317 10/06/19 0254  WBC 22.5* 35.0* 33.5* 34.2*  NEUTROABS 2.6 6.1 5.9 5.5  HGB 12.8* 11.1* 10.0* 10.0*  HCT 40.8 34.5* 30.9* 31.5*  MCV 93.4 92.7 93.4 93.8  PLT 205 106* 104* 105*    Basic Metabolic Panel: Recent Labs  Lab 10/02/19 2214 10/05/19 1841  NA 142 141  K 3.7 3.8  CL 109 111  CO2 22 22  GLUCOSE  174* 160*  BUN 13 18  CREATININE 0.93 1.03  CALCIUM 9.1 8.4*   GFR: Estimated Creatinine Clearance: 69.9 mL/min (by C-G formula based on SCr of 1.03 mg/dL). Recent Labs  Lab 10/02/19 2214 10/05/19 1841 10/05/19 1841 10/05/19 2107 10/05/19 2317 10/06/19 0254 10/06/19 0534  PROCALCITON  --   --   --   --   --  0.11  --   WBC 22.5* 35.0*  --   --  33.5* 34.2*  --   LATICACIDVEN  --  2.7*   < > 1.9 1.4 1.5 1.5   < > = values  in this interval not displayed.    Liver Function Tests: Recent Labs  Lab 10/02/19 2214 10/05/19 1841  AST 20 34  ALT 36 46*  ALKPHOS 126 92  BILITOT 0.2* 0.2*  PROT 6.9 4.7*  ALBUMIN 3.4* 2.4*   No results for input(s): LIPASE, AMYLASE in the last 168 hours. No results for input(s): AMMONIA in the last 168 hours.  ABG No results found for: PHART, PCO2ART, PO2ART, HCO3, TCO2, ACIDBASEDEF, O2SAT   Coagulation Profile: Recent Labs  Lab 10/05/19 1841  INR 1.3*    Cardiac Enzymes: No results for input(s): CKTOTAL, CKMB, CKMBINDEX, TROPONINI in the last 168 hours.  HbA1C: No results found for: HGBA1C  CBG: Recent Labs  Lab 10/02/19 2330 10/06/19 1715 10/06/19 1722  GLUCAP 202* 119* 119*       Past Medical History  He,  has a past medical history of Diabetes mellitus without complication ([place]), Hypercholesteremia, Hypertension, Hypothyroid, and Mycosis fungoides ([place]).   Surgical History    Past Surgical History:  Procedure Laterality Date  . CYSTOSCOPY WITH LITHOLAPAXY N/A 09/17/2016   Procedure: CYSTOSCOPY WITH LITHOLAPAXY;  Surgeon: Cleon Gustin, MD;  Location: [facility];  Service: Urology;  Laterality: N/A;  30 MINS [phone] [id number]  . HOLMIUM LASER APPLICATION N/A 9/92/4268   Procedure: HOLMIUM LASER APPLICATION;  Surgeon: Cleon Gustin, MD;  Location: [facility];  Service: Urology;  Laterality: N/A;     Social History   reports that he quit smoking about 6 years ago. His smoking  use included cigarettes. He smoked 0.25 packs per day. He has never used smokeless tobacco. He reports that he does not drink alcohol and does not use drugs.   Family History   His family history includes Cancer in his brother.   Allergies No Known Allergies   Home Medications  Prior to Admission medications   Medication Sig Start Date End Date Taking? Authorizing Provider  aspirin EC 81 MG tablet Take 81 mg by mouth daily. Swallow whole.   Yes [provider]  cetirizine (ZYRTEC) 10 MG tablet Take 1 tablet by mouth daily. 05/14/19 05/13/20 Yes [provider]  furosemide (LASIX) 20 MG tablet Take 0.5 tablets (10 mg total) by mouth daily as needed for edema. 10/03/19  Yes Rancour, Annie Main, MD  gabapentin (NEURONTIN) 600 MG tablet Take 600 mg by mouth 3 (three) times daily. 09/21/19  Yes [provider]  HYDROcodone-acetaminophen (NORCO/VICODIN) 5-325 MG tablet Take 1 tablet by mouth 2 (two) times daily as needed. 09/21/19  Yes [provider]  levothyroxine (SYNTHROID) 125 MCG tablet Take 125 mcg by mouth daily. 08/20/19  Yes [provider]  Omega-3 Fatty Acids (FISH OIL) 1000 MG CAPS Take 1 capsule by mouth daily.   Yes [provider]  tamsulosin (FLOMAX) 0.4 MG CAPS capsule Take 1 capsule (0.4 mg total) by mouth daily after supper. 09/17/16  Yes McKenzie, Candee Furbish, MD  traMADol (ULTRAM) 50 MG tablet Take 1 tablet (50 mg total) by mouth every 6 (six) hours as needed. 04/07/18  Yes Triplett, Tammy, PA-C  zolpidem (AMBIEN) 5 MG tablet Take 5 mg by mouth at bedtime as needed. 09/08/19  Yes [provider]       The patient is critically ill with multiple organ systems failure and requires high complexity decision making for assessment and support, frequent evaluation and titration of therapies, application of advanced monitoring technologies and extensive interpretation of multiple databases. Critical Care Time devoted to patient care  services  described in this note is 60 minutes total today.   Christinia Gully, MD Pulmonary and [place] [phone]   After 7:00 pm call Elink  [phone]

## 2019-10-06 NOTE — Progress Notes (Addendum)
Pt seen briefly on am rounds with hx of new gen leg swelling then hypotensive p taking lasix s evidence to support sepsis and improved p 4 liter fluid challenge, denies sob on RA, no pain N or V or D and wants to eat/ drink  This dramatic change with fluid shift in pt with original complaint of ext swelling concerning for a cardiac source such as hyperterophic CM/ restrictive CM or pericardial effusion so rec stat echo and if this doesn't show something he needs to go to [facility] for (pericardial dz being the most concerning) then he can be admitted to [facility] with PCCM service consulting  Discussed with Dr Herbie Saxon, MD Pulmonary and [place] [phone]   After 7:00 pm call Elink  [phone]

## 2019-10-07 ENCOUNTER — Inpatient Hospital Stay (HOSPITAL_COMMUNITY): Payer: Medicare (Managed Care)

## 2019-10-07 DIAGNOSIS — D759 Disease of blood and blood-forming organs, unspecified: Secondary | ICD-10-CM

## 2019-10-07 DIAGNOSIS — C8400 Mycosis fungoides, unspecified site: Secondary | ICD-10-CM

## 2019-10-07 LAB — URINE CULTURE: Culture: NO GROWTH

## 2019-10-07 LAB — CBC WITH DIFFERENTIAL/PLATELET
Abs Immature Granulocytes: 0.21 10*3/uL — ABNORMAL HIGH (ref 0.00–0.07)
Basophils Absolute: 0.1 10*3/uL (ref 0.0–0.1)
Basophils Relative: 0 %
Eosinophils Absolute: 14.7 10*3/uL — ABNORMAL HIGH (ref 0.0–0.5)
Eosinophils Relative: 58 %
HCT: 29.1 % — ABNORMAL LOW (ref 39.0–52.0)
Hemoglobin: 9.2 g/dL — ABNORMAL LOW (ref 13.0–17.0)
Immature Granulocytes: 1 %
Lymphocytes Relative: 11 %
Lymphs Abs: 2.7 10*3/uL (ref 0.7–4.0)
MCH: 29.5 pg (ref 26.0–34.0)
MCHC: 31.6 g/dL (ref 30.0–36.0)
MCV: 93.3 fL (ref 80.0–100.0)
Monocytes Absolute: 3 10*3/uL — ABNORMAL HIGH (ref 0.1–1.0)
Monocytes Relative: 12 %
Neutro Abs: 4.4 10*3/uL (ref 1.7–7.7)
Neutrophils Relative %: 18 %
Platelets: 77 10*3/uL — ABNORMAL LOW (ref 150–400)
RBC: 3.12 MIL/uL — ABNORMAL LOW (ref 4.22–5.81)
RDW: 17.9 % — ABNORMAL HIGH (ref 11.5–15.5)
WBC: 24.9 10*3/uL — ABNORMAL HIGH (ref 4.0–10.5)
nRBC: 0.2 % (ref 0.0–0.2)

## 2019-10-07 LAB — GLUCOSE, CAPILLARY
Glucose-Capillary: 84 mg/dL (ref 70–99)
Glucose-Capillary: 74 mg/dL (ref 70–99)
Glucose-Capillary: 130 mg/dL — ABNORMAL HIGH (ref 70–99)
Glucose-Capillary: 97 mg/dL (ref 70–99)
Glucose-Capillary: 105 mg/dL — ABNORMAL HIGH (ref 70–99)

## 2019-10-07 LAB — COMPREHENSIVE METABOLIC PANEL
ALT: 42 U/L (ref 0–44)
AST: 23 U/L (ref 15–41)
Albumin: 2.4 g/dL — ABNORMAL LOW (ref 3.5–5.0)
Alkaline Phosphatase: 97 U/L (ref 38–126)
Anion gap: 9 (ref 5–15)
BUN: 20 mg/dL (ref 8–23)
CO2: 22 mmol/L (ref 22–32)
Calcium: 8.4 mg/dL — ABNORMAL LOW (ref 8.9–10.3)
Chloride: 113 mmol/L — ABNORMAL HIGH (ref 98–111)
Creatinine, Ser: 1.13 mg/dL (ref 0.61–1.24)
GFR calc Af Amer: >60 mL/min (ref >60)
GFR calc non Af Amer: >60 mL/min (ref >60)
Glucose, Bld: 88 mg/dL (ref 70–99)
Potassium: 4 mmol/L (ref 3.5–5.1)
Sodium: 144 mmol/L (ref 135–145)
Total Bilirubin: 0.8 mg/dL (ref 0.3–1.2)
Total Protein: 5 g/dL — ABNORMAL LOW (ref 6.5–8.1)

## 2019-10-07 LAB — MAGNESIUM: Magnesium: 2 mg/dL (ref 1.7–2.4)

## 2019-10-07 LAB — PHOSPHORUS: Phosphorus: 3.2 mg/dL (ref 2.5–4.6)

## 2019-10-07 LAB — PATHOLOGIST SMEAR REVIEW

## 2019-10-07 LAB — MRSA PCR SCREENING: MRSA by PCR: NEGATIVE

## 2019-10-07 LAB — PROCALCITONIN: Procalcitonin: 0.11 ng/mL

## 2019-10-07 NOTE — Progress Notes (Signed)
Patient Demographics:    Cory Lynch, is a 74 y.o. male, DOB - 1945-08-22, WPV:[id number]  Admit date - 10/05/2019   Admitting Physician Murlean Iba, MD  Outpatient Primary MD for the patient is [facility]  LOS - 2   Chief Complaint  Patient presents with  . Hypotension        Subjective:    Cory Lynch today has no fevers, no emesis,  No chest pain,   No diarrhea, able to eat -Complains of fatigue and generalized weakness  Assessment  & Plan :    Principal Problem:   Hypovolemic shock (HCC) Active Problems:   Hypotension   Hypothermia   Leukocytosis   Mycosis fungoides (HCC)   Hypertension   Hypercholesteremia   Diabetes mellitus without complication (HCC)   Hypothyroid   Anemia, unspecified   Lactic acidosis   Hypertriglyceridemia   Drug-induced cytopenia   Hyperglycemia   Thrombocytopenia (HCC)   Peripheral neuropathy   Insomnia  Brief Summary:- 74 y.o. male with medical history significant for mycosis fungoides with folliculotropic infiltrates in patch-plaque lesions as well as more infiltrative tumors and nodules most prominent on the bilateral upper extremities.  He is being treated for this by [facility] oncology with chemotherapy.  He started gemcitabine in August 2021.  He has received at least 2 treatments. 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, hypertriglyceridemia and cytopenia secondary to chemotherapy admitted on 10/06/2019 with hemodynamic instability and persistent hypotension without definite source of infection  A/p 1)Hypovolemic Shock--- suspect secondary to diuretics -Improved after aggressive IV fluids and discontinuation of diuretics Cortisol WNL -Lactic acidosis resolved with IV fluids -Urine and blood cultures NGTD PCT 0.11 >>0.11 WBC >> 33.5 >>34.2 >>24.9 (??CLL) -Sepsis ruled out  -Echo with EF  of 65 to 70% without regional wall motion abnormalities  2)Hypothermia--able to come off warming blanket, temperature has normalized  3)Mycosis fungoides/ Lymphoma T cell - Pt is being at [facility] oncology and was due to be seen on 10/08/19. He is actively being treated with chemotherapy-  started gemcitabine in August 26th 2021.  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 Follow up with [facility] oncology.  They noted that even if CLL were present no treatment would be indicated at this time.  6)Drug-induced hypothyroidism--- continue levothyroxine  7)DM2- Use Novolog/Humalog Sliding scale insulin with Accu-Cheks/Fingersticks as ordered    Disposition/Need for in-Hospital Stay- patient unable to be discharged at this time due to --hemodynamic instability requiring continuous IV fluids pending further culture data  Status is: Inpatient  Remains inpatient appropriate because:--hemodynamic instability requiring continuous IV fluids pending further culture data   Disposition: The patient is from: Home              Anticipated d/c is to: Home              Anticipated d/c date is: 1 day              Patient currently is not medically stable to  d/c. Barriers: Not Clinically Stable- --hemodynamic instability requiring continuous IV fluids pending further culture data  Code Status : full  Family Communication:    (patient is alert, awake and coherent)   Consults  :  PCCM  DVT Prophylaxis  :  Lovenox -  SCDs   Lab Results  Component Value Date   PLT 77 (L) 10/07/2019    Inpatient Medications  Scheduled Meds: . aspirin EC  81 mg Oral Daily  . Chlorhexidine Gluconate Cloth  6 each Topical Daily  . enoxaparin (LOVENOX) injection  40 mg Subcutaneous Q24H  .  insulin aspart  0-15 Units Subcutaneous TID WC  . insulin aspart  0-5 Units Subcutaneous QHS  . insulin aspart  3 Units Subcutaneous TID WC  . levothyroxine  125 mcg Oral Daily  . omega-3 acid ethyl esters  1 g Oral Daily  . pantoprazole  40 mg Oral Q0600   Continuous Infusions: . lactated ringers 100 mL/hr at 10/07/19 1055   PRN Meds:.acetaminophen **OR** acetaminophen, bisacodyl, gabapentin, HYDROcodone-acetaminophen, loratadine, ondansetron **OR** ondansetron (ZOFRAN) IV, zolpidem    Anti-infectives (From admission, onward)   Start     Dose/Rate Route Frequency Ordered Stop   10/06/19 1400  metroNIDAZOLE (FLAGYL) IVPB 500 mg  Status:  Discontinued        500 mg 100 mL/hr over 60 Minutes Intravenous Every 8 hours 10/06/19 1328 10/07/19 0649   10/06/19 0700  vancomycin (VANCOREADY) IVPB 1250 mg/250 mL  Status:  Discontinued        1,250 mg 166.7 mL/hr over 90 Minutes Intravenous Every 12 hours 10/05/19 1942 10/07/19 0649   10/06/19 0400  ceFEPIme (MAXIPIME) 2 g in sodium chloride 0.9 % 100 mL IVPB  Status:  Discontinued        2 g 200 mL/hr over 30 Minutes Intravenous Every 8 hours 10/05/19 1822 10/07/19 0649   10/05/19 1830  ceFEPIme (MAXIPIME) 2 g in sodium chloride 0.9 % 100 mL IVPB        2 g 200 mL/hr over 30 Minutes Intravenous  Once 10/05/19 1816 10/05/19 1918   10/05/19 1830  metroNIDAZOLE (FLAGYL) IVPB 500 mg        500 mg 100 mL/hr over 60 Minutes Intravenous  Once 10/05/19 1816 10/05/19 1951   10/05/19 1830  vancomycin (VANCOCIN) IVPB 1000 mg/200 mL premix  Status:  Discontinued        1,000 mg 200 mL/hr over 60 Minutes Intravenous  Once 10/05/19 1816 10/05/19 1822   10/05/19 1830  vancomycin (VANCOREADY) IVPB 2000 mg/400 mL        2,000 mg 200 mL/hr over 120 Minutes Intravenous  Once 10/05/19 1822 10/05/19 2117        Objective:   Vitals:   10/07/19 1300 10/07/19 1400 10/07/19 1500 10/07/19 1600  BP: (!) 97/40 (!) 101/54 107/60   Pulse: 69 71 72   Resp: 13  14 12    Temp:    97.8 F (36.6 C)  TempSrc:    Oral  SpO2: 100% 100% 100%   Weight:      Height:        Wt Readings from Last 3 Encounters:  10/07/19 101.5 kg  10/02/19 93.9 kg  09/07/19 93.9 kg     Intake/Output Summary (Last 24 hours) at 10/07/2019 1820 Last data filed at 10/07/2019 1414 Gross per 24 hour  Intake 1766.47 ml  Output 1550 ml  Net 216.47 ml     Physical Exam  Gen:- Awake Alert,  In no  apparent distress  HEENT:- [place].AT, No sclera icterus Neck-Supple Neck,No JVD,.  Lungs-  CTAB , fair symmetrical air movement CV- S1, S2 normal, regular  Abd-  +ve B.Sounds, Abd Soft, No tenderness,    Extremity- No  edema, pedal pulses present  Psych-affect is appropriate, oriented x3 Neuro-no new focal deficits, no tremors -Skin--cutaneous lesions noted   Data Review:   Micro Results Recent Results (from the past 240 hour(s))  SARS Coronavirus 2 by RT PCR (hospital order, performed in [facility] hospital lab) Nasopharyngeal Nasopharyngeal Swab     Status: None   Collection Time: 10/05/19  6:18 PM   Specimen: Nasopharyngeal Swab  Result Value Ref Range Status   SARS Coronavirus 2 NEGATIVE NEGATIVE Final    Comment: (NOTE) SARS-CoV-2 target nucleic acids are NOT DETECTED.  The SARS-CoV-2 RNA is generally detectable in upper and lower respiratory specimens during the acute phase of infection. The lowest concentration of SARS-CoV-2 viral copies this assay can detect is 250 copies / mL. A negative result does not preclude SARS-CoV-2 infection and should not be used as the sole basis for treatment or other patient management decisions.  A negative result may occur with improper specimen collection / handling, submission of specimen other than nasopharyngeal swab, presence of viral mutation(s) within the areas targeted by this assay, and inadequate number of viral copies (<250 copies / mL). A negative result must be combined with clinical observations, patient history,  and epidemiological information.  Fact Sheet for Patients:   StrictlyIdeas.no  Fact Sheet for Healthcare Providers: BankingDealers.co.za  This test is not yet approved or  cleared by the [place] FDA and has been authorized for detection and/or diagnosis of SARS-CoV-2 by FDA under an Emergency Use Authorization (EUA).  This EUA will remain in effect (meaning this test can be used) for the duration of the COVID-19 declaration under Section 564(b)(1) of the Act, 21 U.S.C. section 360bbb-3(b)(1), unless the authorization is terminated or revoked sooner.  Performed at [facility], [address], [place], [place] [address]   Blood Culture (routine x 2)     Status: None (Preliminary result)   Collection Time: 10/05/19  6:41 PM   Specimen: BLOOD RIGHT FOREARM  Result Value Ref Range Status   Specimen Description BLOOD RIGHT FOREARM  Final   Special Requests   Final    BOTTLES DRAWN AEROBIC AND ANAEROBIC Blood Culture adequate volume   Culture   Final    NO GROWTH 2 DAYS Performed at [facility], [address], [place], [place] [address]    Report Status PENDING  Incomplete  Blood Culture (routine x 2)     Status: None (Preliminary result)   Collection Time: 10/05/19  9:07 PM   Specimen: BLOOD RIGHT HAND  Result Value Ref Range Status   Specimen Description BLOOD RIGHT HAND  Final   Special Requests   Final    BOTTLES DRAWN AEROBIC AND ANAEROBIC Blood Culture adequate volume   Culture   Final    NO GROWTH 2 DAYS Performed at [facility], [address] [place], [place] [address]    Report Status PENDING  Incomplete  Urine culture     Status: None   Collection Time: 10/05/19  9:10 PM   Specimen: In/Out Cath Urine  Result Value Ref Range Status   Specimen Description   Final    IN/OUT CATH URINE Performed at [facility], [address], [place], [place] [address]    Special Requests   Final  NONE Performed at [facility], [address], [place], [place] [address]    Culture   Final    NO GROWTH Performed at [facility], [place] [address] [place], [place] [address]    Report Status 10/07/2019 FINAL  Final  MRSA PCR Screening     Status: None   Collection Time: 10/06/19  6:45 PM   Specimen: Nasal Mucosa; Nasopharyngeal  Result Value Ref Range Status   MRSA by PCR NEGATIVE NEGATIVE Final    Comment:        The GeneXpert MRSA Assay (FDA approved for NASAL specimens only), is one component of a comprehensive MRSA colonization surveillance program. It is not intended to diagnose MRSA infection nor to guide or monitor treatment for MRSA infections. Performed at [facility], [address], [place], [place] [address]     Radiology Reports Portable chest 1 View  Result Date: 10/07/2019 CLINICAL DATA:  Septic shock EXAM: PORTABLE CHEST 1 VIEW COMPARISON:  Two days ago FINDINGS: Low volume chest with mild vascular prominence. There is no edema, consolidation, effusion, or pneumothorax. Mild cardiomegaly accentuated by low volumes. IMPRESSION: Stable low volume chest. Electronically Signed   By: Monte Fantasia M.D.   On: 10/07/2019 05:37   DG Chest Port 1 View  Result Date: 10/05/2019 CLINICAL DATA:  Hypotension. Upper and lower extremity swelling. Possible sepsis. Ex-smoker. EXAM: PORTABLE CHEST 1 VIEW COMPARISON:  10/02/2019 FINDINGS: Stable enlarged cardiac silhouette, clear lungs and mildly prominent pulmonary vasculature. Thoracic spine degenerative changes. IMPRESSION: Stable cardiomegaly and mild pulmonary vascular congestion. Electronically Signed   By: Claudie Revering M.D.   On: 10/05/2019 19:26   DG Chest Portable 1 View  Result Date: 10/02/2019 CLINICAL DATA:  Leg swelling.  Leg pain. EXAM: PORTABLE CHEST 1 VIEW COMPARISON:  None. FINDINGS: Mild cardiomegaly. Mild peribronchial thickening. No focal airspace disease, pleural effusion or pneumothorax. No acute osseous abnormalities are seen.  IMPRESSION: Mild cardiomegaly. Mild peribronchial thickening may be chronic or seen with bronchitis. Electronically Signed   By: Keith Rake M.D.   On: 10/02/2019 23:55   ECHOCARDIOGRAM COMPLETE  Result Date: 10/06/2019    ECHOCARDIOGRAM REPORT   Patient Name:   Cory Lynch Date of Exam: 10/06/2019 Medical Rec #:  [id number]       Height:       64.0 in Accession #:    [id number]      Weight:       224.0 lb Date of Birth:  Mar 10, 1945        BSA:          2.053 m Patient Age:    62 years        BP:           97/52 mmHg Patient Gender: M               HR:           97 bpm. Exam Location:  [facility] Procedure: 2D Echo Indications:    Hypotension [[id number]  History:        Patient has no prior history of Echocardiogram examinations.                 Risk Factors:Diabetes, Hypertension, Dyslipidemia and Former                 Smoker. Hypotension.  Sonographer:    Leavy Cella RDCS (AE) Referring Phys: [place]  1. Left ventricular ejection fraction, by estimation, is  65 to 70%. The left ventricle has normal function. The left ventricle has no regional wall motion abnormalities. Left ventricular diastolic parameters are indeterminate.  2. Right ventricular systolic function is normal. The right ventricular size is normal. There is normal pulmonary artery systolic pressure. The estimated right ventricular systolic pressure is 46.2 mmHg.  3. The mitral valve is grossly normal. Trivial mitral valve regurgitation.  4. The aortic valve is tricuspid. Aortic valve regurgitation is not visualized.  5. The inferior vena cava is normal in size with greater than 50% respiratory variability, suggesting right atrial pressure of 3 mmHg. FINDINGS  Left Ventricle: Left ventricular ejection fraction, by estimation, is 65 to 70%. The left ventricle has normal function. The left ventricle has no regional wall motion abnormalities. The left ventricular internal cavity size was normal in size. There is   borderline left ventricular hypertrophy. Left ventricular diastolic parameters are indeterminate. Right Ventricle: The right ventricular size is normal. No increase in right ventricular wall thickness. Right ventricular systolic function is normal. There is normal pulmonary artery systolic pressure. The tricuspid regurgitant velocity is 2.49 m/s, and  with an assumed right atrial pressure of 3 mmHg, the estimated right ventricular systolic pressure is 70.3 mmHg. Left Atrium: Left atrial size was normal in size. Right Atrium: Right atrial size was normal in size. Pericardium: There is no evidence of pericardial effusion. Presence of pericardial fat pad. Mitral Valve: The mitral valve is grossly normal. Trivial mitral valve regurgitation. Tricuspid Valve: The tricuspid valve is grossly normal. Tricuspid valve regurgitation is trivial. Aortic Valve: The aortic valve is tricuspid. Aortic valve regurgitation is not visualized. Mild aortic valve annular calcification. Pulmonic Valve: The pulmonic valve was grossly normal. Pulmonic valve regurgitation is trivial. Aorta: The aortic root is normal in size and structure. Venous: The inferior vena cava is normal in size with greater than 50% respiratory variability, suggesting right atrial pressure of 3 mmHg. IAS/Shunts: No atrial level shunt detected by color flow Doppler.  LEFT VENTRICLE PLAX 2D LVIDd:         4.90 cm  Diastology LVIDs:         3.06 cm  LV e' lateral:   8.81 cm/s LV PW:         1.14 cm  LV E/e' lateral: 11.3 LV IVS:        1.04 cm  LV e' medial:    6.74 cm/s LVOT diam:     1.80 cm  LV E/e' medial:  14.7 LVOT Area:     2.54 cm  RIGHT VENTRICLE RV S prime:     14.00 cm/s TAPSE (M-mode): 3.7 cm LEFT ATRIUM             Index       RIGHT ATRIUM           Index LA diam:        3.40 cm 1.66 cm/m  RA Area:     11.20 cm LA Vol (A2C):   75.4 ml 36.73 ml/m RA Volume:   21.80 ml  10.62 ml/m LA Vol (A4C):   37.1 ml 18.07 ml/m LA Biplane Vol: 55.3 ml 26.94 ml/m    AORTA Ao Root diam: 3.00 cm MITRAL VALVE               TRICUSPID VALVE MV Area (PHT): 5.16 cm    TR Peak grad:   24.8 mmHg MV Decel Time: 147 msec    TR Vmax:  249.00 cm/s MV E velocity: 99.40 cm/s MV A velocity: 76.70 cm/s  SHUNTS MV E/A ratio:  1.30        Systemic Diam: 1.80 cm Rozann Lesches MD Electronically signed by Rozann Lesches MD Signature Date/Time: 10/06/2019/11:31:33 AM    Final      CBC Recent Labs  Lab 10/02/19 2214 10/05/19 1841 10/05/19 2317 10/06/19 0254 10/07/19 0536  WBC 22.5* 35.0* 33.5* 34.2* 24.9*  HGB 12.8* 11.1* 10.0* 10.0* 9.2*  HCT 40.8 34.5* 30.9* 31.5* 29.1*  PLT 205 106* 104* 105* 77*  MCV 93.4 92.7 93.4 93.8 93.3  MCH 29.3 29.8 30.2 29.8 29.5  MCHC 31.4 32.2 32.4 31.7 31.6  RDW 16.9* 17.2* 17.1* 17.2* 17.9*  LYMPHSABS 2.4 2.3 2.5 1.9 2.7  MONOABS 0.3 0.7 0.7 2.1* 3.0*  EOSABS 16.9* 25.7* 24.3* 24.4* 14.7*  BASOSABS 0.1 0.1 0.1 0.1 0.1    Chemistries  Recent Labs  Lab 10/02/19 2214 10/05/19 1841 10/07/19 0536  NA 142 141 144  K 3.7 3.8 4.0  CL 109 111 113*  CO2 22 22 22   GLUCOSE 174* 160* 88  BUN 13 18 20   CREATININE 0.93 1.03 1.13  CALCIUM 9.1 8.4* 8.4*  MG  --   --  2.0  AST 20 34 23  ALT 36 46* 42  ALKPHOS 126 92 97  BILITOT 0.2* 0.2* 0.8   ------------------------------------------------------------------------------------------------------------------ No results for input(s): CHOL, HDL, LDLCALC, TRIG, CHOLHDL, LDLDIRECT in the last 72 hours.  Lab Results  Component Value Date   HGBA1C 6.9 (H) 10/06/2019   ------------------------------------------------------------------------------------------------------------------ Recent Labs    10/05/19 1841  TSH 2.008   ------------------------------------------------------------------------------------------------------------------ No results for input(s): VITAMINB12, FOLATE, FERRITIN, TIBC, IRON, RETICCTPCT in the last 72 hours.  Coagulation profile Recent Labs  Lab  10/05/19 1841  INR 1.3*    No results for input(s): DDIMER in the last 72 hours.  Cardiac Enzymes No results for input(s): CKMB, TROPONINI, MYOGLOBIN in the last 168 hours.  Invalid input(s): CK ------------------------------------------------------------------------------------------------------------------    Component Value Date/Time   BNP 55.0 10/05/2019 1841     Cory Lynch M.D on 10/07/2019 at 6:20 PM  Go to [email or web address] - for contact info  [facility] - Office  [phone]

## 2019-10-07 NOTE — Progress Notes (Signed)
NAME:  Cory Lynch, MRN:  [id number], DOB:  05/18/1945, LOS: 2 ADMISSION DATE:  10/05/2019, CONSULTATION DATE:  9/7  REFERRING MD:  Kohut/ EDP, CHIEF COMPLAINT:  Weak    Brief History   32 yobm former smoker rx for T cell lymphoma by [facility] last dose gemcitabline 8/26 with progressive leg swelling since then rx with Lasix then returned 9/6 with weakness and found to be hypotensive and hypothemic and assumed septic but no source identified so PCCM service consulted at that point with plans ? tx to [facility]  History of present illness   74 y.o. male with medical history significant for mycosis fungoides with folliculotropic infiltrates in patch-plaque lesions as well as more infiltrative tumors and nodules most prominent on the bilateral upper extremities.  He is being treated for this by [facility] oncology with chemotherapy.  He started gemcitabine in August 2021.  He has received at least 2 treatments. 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   He was seen last week in the ER with complaints of several weeks of leg swelling.  He was prescribed lasix and compression stockings.  He says that he has been taking the lasix regularly.    He presented to ED by EMS on 9/6 with complaints of weakness.  He was noted to be hypotensive with BP 70/38.  He was cool and found to be hypothermic.  He was initially treated presumptively for a septic shock and started on IV antibiotics and received 4 liters of IV fluids.  He was accepted to be admitted to the ICU service at [facility].  While holding in the ED it was noted that his BP stabilized after IV fluids and he never required pressors.  He was placed on a bair hugger and his temperature started to improve.  His lactic acid was initially elevated at 2.7 but them quickly normalized after IV fluids and has remained normal.   He was noted to have marked leukocytosis but with  his history of lymphocytosis and possibly CLL (he has not had a biopsy done to confirm CLL) he has a chronic leukocytosis and the clinical significance is difficult to determine.    The patient is being admitted for hypovolemic shock likely secondary to over diuresis and he will be admitted to stepdown ICU.  No source of infection has been found and sepsis has been ruled out.    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 aspirin EC  81 mg Oral Daily  . Chlorhexidine Gluconate Cloth  6 each Topical Daily  . enoxaparin (LOVENOX) injection  40 mg Subcutaneous Q24H  . insulin aspart  0-15 Units Subcutaneous TID WC  . insulin aspart  0-5 Units Subcutaneous QHS  . insulin aspart  3 Units Subcutaneous TID WC  . levothyroxine  125 mcg Oral Daily  .  omega-3 acid ethyl esters  1 g Oral Daily  . pantoprazole  40 mg Oral Q0600   Continuous Infusions: . lactated ringers 100 mL/hr at 10/07/19 1055   PRN Meds:.acetaminophen **OR** acetaminophen, bisacodyl, gabapentin, HYDROcodone-acetaminophen, loratadine, ondansetron **OR** ondansetron (ZOFRAN) IV, zolpidem  Interim history/subjective:  Feeling better p fluids and wants ot eat / drinl  Objective   Blood pressure 107/60, pulse 72, temperature 97.8 F (36.6 C), temperature source Oral, resp. rate 12, height 5\' 4"  (1.626 m), weight 101.5 kg, SpO2 100 %.    FiO2 (%):  [28 %] 28 %   Intake/Output Summary (Last 24 hours) at 10/07/2019 1705 Last data filed at 10/07/2019 1414 Gross per 24 hour  Intake 1766.47 ml  Output 1550  ml  Net 216.47 ml   Filed Weights   10/05/19 1754 10/05/19 1803 10/07/19 0500  Weight: 101.6 kg 101.6 kg 101.5 kg    Examination: Tmax  97.8 Pt alert, approp nad @ 30 degrees hob  No jvd Oropharynx clear,  mucosa nl Neck supple Lungs with distant bs bilaterally s crackles or wheeze  RRR no s3 or or sign murmur Abd obese mod distended / good appetite    Extr warm with no edema or clubbing noted Neuro  Sensorium appears intact / no apparent motor deficits      I personally reviewed images and agree with radiology impression as follows:  CXR:   Portable 9/8 Stable low volume chest.      Resolved Hospital Problem list      Assessment & Plan:    1) Circulatory shock with relatively low cvp by echo, good LV and RV and high uop with blood sugars not in glycosuric range   ? Neuropathy contributing   - hold all antihypertensives including flomax / lasix  - watch serial PCTs though no source sepsis identified  2) Lymphoma T cell s/p chemo 09/24/19 [facility]  - pulmonary toxicity greatest concern but  None apparent at present as comfortable with good sats on RA even p 4 liters  3 Mild thrombocytopenia/ anemia  likely due to chemo/lymphoma  Lab Results  Component Value Date   PLT 77 (L) 10/07/2019   PLT 105 (L) 10/06/2019   PLT 104 (L) 10/05/2019      Lab Results  Component Value Date   HGB 9.2 (L) 10/07/2019   HGB 10.0 (L) 10/06/2019   HGB 10.0 (L) 10/05/2019    4) peripheral edema likely due to low albumin as echo ok/ creatinine ok     Labs   CBC: Recent Labs  Lab 10/02/19 2214 10/05/19 1841 10/05/19 2317 10/06/19 0254 10/07/19 0536  WBC 22.5* 35.0* 33.5* 34.2* 24.9*  NEUTROABS 2.6 6.1 5.9 5.5 4.4  HGB 12.8* 11.1* 10.0* 10.0* 9.2*  HCT 40.8 34.5* 30.9* 31.5* 29.1*  MCV 93.4 92.7 93.4 93.8 93.3  PLT 205 106* 104* 105* 77*    Basic Metabolic Panel: Recent Labs  Lab 10/02/19 2214 10/05/19 1841 10/07/19 0536  NA 142 141 144  K 3.7 3.8 4.0  CL 109 111  113*  CO2 22 22 22   GLUCOSE 174* 160* 88  BUN 13 18 20   CREATININE 0.93 1.03 1.13  CALCIUM 9.1 8.4* 8.4*  MG  --   --  2.0  PHOS  --   --  3.2   GFR: Estimated Creatinine Clearance: 63.6 mL/min (by C-G formula based on SCr of 1.13 mg/dL). Recent Labs  Lab 10/05/19 1841 10/05/19 1841 10/05/19 2107 10/05/19 2317 10/06/19 0254  10/06/19 0534 10/07/19 0536  PROCALCITON  --   --   --   --  0.11  --  0.11  WBC 35.0*  --   --  33.5* 34.2*  --  24.9*  LATICACIDVEN 2.7*   < > 1.9 1.4 1.5 1.5  --    < > = values in this interval not displayed.    Liver Function Tests: Recent Labs  Lab 10/02/19 2214 10/05/19 1841 10/07/19 0536  AST 20 34 23  ALT 36 46* 42  ALKPHOS 126 92 97  BILITOT 0.2* 0.2* 0.8  PROT 6.9 4.7* 5.0*  ALBUMIN 3.4* 2.4* 2.4*   No results for input(s): LIPASE, AMYLASE in the last 168 hours. No results for input(s): AMMONIA in the last 168 hours.  ABG No results found for: PHART, PCO2ART, PO2ART, HCO3, TCO2, ACIDBASEDEF, O2SAT   Coagulation Profile: Recent Labs  Lab 10/05/19 1841  INR 1.3*    Cardiac Enzymes: No results for input(s): CKTOTAL, CKMB, CKMBINDEX, TROPONINI in the last 168 hours.  HbA1C: Hgb A1c MFr Bld  Date/Time Value Ref Range Status  10/06/2019 01:27 PM 6.9 (H) 4.8 - 5.6 % Final    Comment:    (NOTE) Pre diabetes:          5.7%-6.4%  Diabetes:              >6.4%  Glycemic control for   <7.0% adults with diabetes     CBG: Recent Labs  Lab 10/06/19 2110 10/07/19 0252 10/07/19 0754 10/07/19 1115 10/07/19 1600  GLUCAP 81 84 74 130* 97        Still not clear why uop remained  so high for borderline low bp s glycosuria or hypoadrenalism apparent but does not appear critically ill so please call if needed.   Christinia Gully, MD Pulmonary and [place] [phone]   After 7:00 pm call Elink  [phone]

## 2019-10-08 ENCOUNTER — Ambulatory Visit: Admit: 2019-10-08 | Payer: MEDICARE

## 2019-10-08 LAB — COMPREHENSIVE METABOLIC PANEL
ALT: 38 U/L (ref 0–44)
AST: 21 U/L (ref 15–41)
Albumin: 2.6 g/dL — ABNORMAL LOW (ref 3.5–5.0)
Alkaline Phosphatase: 102 U/L (ref 38–126)
Anion gap: 7 (ref 5–15)
BUN: 20 mg/dL (ref 8–23)
CO2: 23 mmol/L (ref 22–32)
Calcium: 8.5 mg/dL — ABNORMAL LOW (ref 8.9–10.3)
Chloride: 114 mmol/L — ABNORMAL HIGH (ref 98–111)
Creatinine, Ser: 1.06 mg/dL (ref 0.61–1.24)
GFR calc Af Amer: >60 mL/min (ref >60)
GFR calc non Af Amer: >60 mL/min (ref >60)
Glucose, Bld: 80 mg/dL (ref 70–99)
Potassium: 3.9 mmol/L (ref 3.5–5.1)
Sodium: 144 mmol/L (ref 135–145)
Total Bilirubin: 0.5 mg/dL (ref 0.3–1.2)
Total Protein: 5.3 g/dL — ABNORMAL LOW (ref 6.5–8.1)

## 2019-10-08 LAB — GLUCOSE, CAPILLARY
Glucose-Capillary: 82 mg/dL (ref 70–99)
Glucose-Capillary: 122 mg/dL — ABNORMAL HIGH (ref 70–99)
Glucose-Capillary: 82 mg/dL (ref 70–99)
Glucose-Capillary: 111 mg/dL — ABNORMAL HIGH (ref 70–99)

## 2019-10-08 LAB — CBC WITH DIFFERENTIAL/PLATELET
Abs Immature Granulocytes: 0.21 10*3/uL — ABNORMAL HIGH (ref 0.00–0.07)
Basophils Absolute: 0.1 10*3/uL (ref 0.0–0.1)
Basophils Relative: 1 %
Eosinophils Absolute: 13.9 10*3/uL — ABNORMAL HIGH (ref 0.0–0.5)
Eosinophils Relative: 56 %
HCT: 29.6 % — ABNORMAL LOW (ref 39.0–52.0)
Hemoglobin: 9.4 g/dL — ABNORMAL LOW (ref 13.0–17.0)
Immature Granulocytes: 1 %
Lymphocytes Relative: 12 %
Lymphs Abs: 2.8 10*3/uL (ref 0.7–4.0)
MCH: 29.5 pg (ref 26.0–34.0)
MCHC: 31.8 g/dL (ref 30.0–36.0)
MCV: 92.8 fL (ref 80.0–100.0)
Monocytes Absolute: 3 10*3/uL — ABNORMAL HIGH (ref 0.1–1.0)
Monocytes Relative: 12 %
Neutro Abs: 4.4 10*3/uL (ref 1.7–7.7)
Neutrophils Relative %: 18 %
Platelets: 64 10*3/uL — ABNORMAL LOW (ref 150–400)
RBC: 3.19 MIL/uL — ABNORMAL LOW (ref 4.22–5.81)
RDW: 18.1 % — ABNORMAL HIGH (ref 11.5–15.5)
WBC: 24.5 10*3/uL — ABNORMAL HIGH (ref 4.0–10.5)
nRBC: 0.2 % (ref 0.0–0.2)

## 2019-10-08 LAB — PHOSPHORUS: Phosphorus: 2.9 mg/dL (ref 2.5–4.6)

## 2019-10-08 LAB — PROCALCITONIN: Procalcitonin: <0.1 ng/mL

## 2019-10-08 LAB — MAGNESIUM: Magnesium: 2.1 mg/dL (ref 1.7–2.4)

## 2019-10-08 LAB — SARS CORONAVIRUS 2 BY RT PCR (HOSPITAL ORDER, PERFORMED IN ~~LOC~~ HOSPITAL LAB): SARS Coronavirus 2: NEGATIVE

## 2019-10-08 NOTE — Progress Notes (Signed)
Patient Demographics:    Cory Lynch, is a 74 y.o. male, DOB - 08-10-1945, VCB:[id number]  Admit date - 10/05/2019   Admitting Physician Murlean Iba, MD  Outpatient Primary MD for the patient is [facility]  LOS - 3   Chief Complaint  Patient presents with  . Hypotension        Subjective:    Cory Lynch today has no fevers, no emesis,  No chest pain,   - Brother at bedside Pt has weakness and deconditioning - Phys therapy ambulated pt  Assessment  & Plan :    Principal Problem:   Hypovolemic shock (HCC) Active Problems:   Hypotension   Hypothermia   Leukocytosis   Mycosis fungoides (HCC)   Hypertension   Hypercholesteremia   Diabetes mellitus without complication (HCC)   Hypothyroid   Anemia, unspecified   Lactic acidosis   Hypertriglyceridemia   Drug-induced cytopenia   Hyperglycemia   Thrombocytopenia (HCC)   Peripheral neuropathy   Insomnia  Brief Summary:- 74 y.o. male with medical history significant for mycosis fungoides with folliculotropic infiltrates in patch-plaque lesions as well as more infiltrative tumors and nodules most prominent on the bilateral upper extremities.  He is being treated for this by [facility] oncology with chemotherapy.  He started gemcitabine in August 2021.  He has received at least 2 treatments. 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, hypertriglyceridemia and cytopenia secondary to chemotherapy admitted on 10/06/2019 with hemodynamic instability and persistent hypotension without definite source of infection --awaiting transfer to SNF rehab   A/p 1)Hypovolemic Shock--- suspect secondary to diuretics -Improved after aggressive IV fluids and discontinuation of diuretics Cortisol WNL -Lactic acidosis resolved with IV fluids -Urine and blood cultures NGTD PCT 0.11 >>0.11>>  < 0.10 WBC >> 33.5  >>34.2 >>24.9 >> 24.5  (??CLL) -Sepsis ruled out  -Echo with EF of 65 to 70% without regional wall motion abnormalities  2)Hypothermia--able to come off warming blanket, temperature has normalized TSH - WNL Cortisol WNL  3)Mycosis fungoides/ Lymphoma T cell - Pt is being at [facility] oncology and was due to be seen on 10/08/19. He is actively being treated with chemotherapy-  started gemcitabine in August 26th 2021.  He has received at least 2 treatments--no evidence of significant pulmonary toxicity from his chemotherapy  4)Thrombocytopenia/Anemia--  likely due to chemo/lymphoma -Hemoglobin stable above 9, platelets 64  5)Monoclonal B cell lymphocytosis - He likely does have CLL but has not had node biopsy done to confirm. Follow up with [facility] oncology.  They noted that even if CLL were present no treatment would be indicated at this time. -Patient will need to hold off on further chemotherapy at this for the next couple of weeks given febrile illness  6)Drug-induced hypothyroidism--- continue levothyroxine  7)DM2-hemoglobin A1c 6.9, use Novolog/Humalog Sliding scale insulin with Accu-Cheks/Fingersticks as ordered   8) generalized weakness and deconditioning--- physical therapy evaluation appreciated recommends SNF rehab  Disposition/Need for in-Hospital Stay- patient unable to be discharged at this time due to --awaiting transfer to SNF rehab  Status is: Inpatient  Remains inpatient appropriate because:-awaiting transfer to SNF rehab  Disposition: The patient is from: Home  Anticipated d/c is to: Home              Anticipated d/c date is: 1 day              Patient currently is medically stable to d/c. Barriers: Not Clinically Stable- ---awaiting transfer to SNF rehab  Code Status : full  Family Communication:    (patient is alert, awake and coherent)   Consults  :  PCCM  DVT Prophylaxis  :  Lovenox -  SCDs   Lab Results  Component Value Date   PLT 64 (L) 10/08/2019      Inpatient Medications  Scheduled Meds: . aspirin EC  81 mg Oral Daily  . Chlorhexidine Gluconate Cloth  6 each Topical Daily  . enoxaparin (LOVENOX) injection  40 mg Subcutaneous Q24H  . insulin aspart  0-15 Units Subcutaneous TID WC  . insulin aspart  0-5 Units Subcutaneous QHS  . insulin aspart  3 Units Subcutaneous TID WC  . levothyroxine  125 mcg Oral Daily  . omega-3 acid ethyl esters  1 g Oral Daily  . pantoprazole  40 mg Oral Q0600   Continuous Infusions: . lactated ringers Stopped (10/08/19 1022)   PRN Meds:.acetaminophen **OR** acetaminophen, bisacodyl, gabapentin, HYDROcodone-acetaminophen, loratadine, ondansetron **OR** ondansetron (ZOFRAN) IV, zolpidem    Anti-infectives (From admission, onward)   Start     Dose/Rate Route Frequency Ordered Stop   10/06/19 1400  metroNIDAZOLE (FLAGYL) IVPB 500 mg  Status:  Discontinued        500 mg 100 mL/hr over 60 Minutes Intravenous Every 8 hours 10/06/19 1328 10/07/19 0649   10/06/19 0700  vancomycin (VANCOREADY) IVPB 1250 mg/250 mL  Status:  Discontinued        1,250 mg 166.7 mL/hr over 90 Minutes Intravenous Every 12 hours 10/05/19 1942 10/07/19 0649   10/06/19 0400  ceFEPIme (MAXIPIME) 2 g in sodium chloride 0.9 % 100 mL IVPB  Status:  Discontinued        2 g 200 mL/hr over 30 Minutes Intravenous Every 8 hours 10/05/19 1822 10/07/19 0649   10/05/19 1830  ceFEPIme (MAXIPIME) 2 g in sodium chloride 0.9 % 100 mL IVPB        2 g 200 mL/hr over 30 Minutes Intravenous  Once 10/05/19 1816 10/05/19 1918   10/05/19 1830  metroNIDAZOLE (FLAGYL) IVPB 500 mg        500 mg 100 mL/hr over 60 Minutes Intravenous  Once 10/05/19 1816 10/05/19 1951   10/05/19 1830  vancomycin (VANCOCIN) IVPB 1000 mg/200 mL premix  Status:  Discontinued        1,000 mg 200 mL/hr over 60 Minutes Intravenous  Once 10/05/19 1816 10/05/19 1822   10/05/19 1830  vancomycin (VANCOREADY) IVPB 2000 mg/400 mL        2,000 mg 200 mL/hr over 120 Minutes  Intravenous  Once 10/05/19 1822 10/05/19 2117        Objective:   Vitals:   10/08/19 0800 10/08/19 0819 10/08/19 1400 10/08/19 1500  BP: (!) 126/57  124/61 138/69  Pulse: 93 90 81 84  Resp: 15 (!) 21 19 19   Temp:      TempSrc:      SpO2: 97% 98% 97% 94%  Weight:      Height:        Wt Readings from Last 3 Encounters:  10/07/19 101.5 kg  10/02/19 93.9 kg  09/07/19 93.9 kg    Intake/Output Summary (Last 24 hours) at 10/08/2019 1549 Last data  filed at 10/08/2019 1542 Gross per 24 hour  Intake 3000 ml  Output 1000 ml  Net 2000 ml   Physical Exam  Gen:- Awake Alert,  In no apparent distress  HEENT:- [place].AT, No sclera icterus Neck-Supple Neck,No JVD,.  Lungs-  CTAB , fair symmetrical air movement CV- S1, S2 normal, regular  Abd-  +ve B.Sounds, Abd Soft, No tenderness,    Extremity- No  edema, pedal pulses present  Psych-affect is appropriate, oriented x3 Neuro-no new focal deficits, no tremors -Skin--cutaneous lesions noted   Data Review:   Micro Results Recent Results (from the past 240 hour(s))  SARS Coronavirus 2 by RT PCR (hospital order, performed in [facility] hospital lab) Nasopharyngeal Nasopharyngeal Swab     Status: None   Collection Time: 10/05/19  6:18 PM   Specimen: Nasopharyngeal Swab  Result Value Ref Range Status   SARS Coronavirus 2 NEGATIVE NEGATIVE Final    Comment: (NOTE) SARS-CoV-2 target nucleic acids are NOT DETECTED.  The SARS-CoV-2 RNA is generally detectable in upper and lower respiratory specimens during the acute phase of infection. The lowest concentration of SARS-CoV-2 viral copies this assay can detect is 250 copies / mL. A negative result does not preclude SARS-CoV-2 infection and should not be used as the sole basis for treatment or other patient management decisions.  A negative result may occur with improper specimen collection / handling, submission of specimen other than nasopharyngeal swab, presence of viral mutation(s) within  the areas targeted by this assay, and inadequate number of viral copies (<250 copies / mL). A negative result must be combined with clinical observations, patient history, and epidemiological information.  Fact Sheet for Patients:   StrictlyIdeas.no  Fact Sheet for Healthcare Providers: BankingDealers.co.za  This test is not yet approved or  cleared by the [place] FDA and has been authorized for detection and/or diagnosis of SARS-CoV-2 by FDA under an Emergency Use Authorization (EUA).  This EUA will remain in effect (meaning this test can be used) for the duration of the COVID-19 declaration under Section 564(b)(1) of the Act, 21 U.S.C. section 360bbb-3(b)(1), unless the authorization is terminated or revoked sooner.  Performed at [facility], [address] [place], [place] [address]   Blood Culture (routine x 2)     Status: None (Preliminary result)   Collection Time: 10/05/19  6:41 PM   Specimen: BLOOD RIGHT FOREARM  Result Value Ref Range Status   Specimen Description BLOOD RIGHT FOREARM  Final   Special Requests   Final    BOTTLES DRAWN AEROBIC AND ANAEROBIC Blood Culture adequate volume   Culture   Final    NO GROWTH 3 DAYS Performed at [facility], [address] [place], [place] [address]    Report Status PENDING  Incomplete  Blood Culture (routine x 2)     Status: None (Preliminary result)   Collection Time: 10/05/19  9:07 PM   Specimen: BLOOD RIGHT HAND  Result Value Ref Range Status   Specimen Description BLOOD RIGHT HAND  Final   Special Requests   Final    BOTTLES DRAWN AEROBIC AND ANAEROBIC Blood Culture adequate volume   Culture   Final    NO GROWTH 3 DAYS Performed at [facility], [address], [place], [place] [address]    Report Status PENDING  Incomplete  Urine culture     Status: None   Collection Time: 10/05/19  9:10 PM   Specimen: In/Out Cath Urine  Result Value Ref Range Status  Specimen  Description   Final    IN/OUT CATH URINE Performed at [facility], [address], [place], [place] [address]    Special Requests   Final    NONE Performed at [facility], [address], [place], [place] [address]    Culture   Final    NO GROWTH Performed at [facility], [place] [address], [place], [place] [address]    Report Status 10/07/2019 FINAL  Final  MRSA PCR Screening     Status: None   Collection Time: 10/06/19  6:45 PM   Specimen: Nasal Mucosa; Nasopharyngeal  Result Value Ref Range Status   MRSA by PCR NEGATIVE NEGATIVE Final    Comment:        The GeneXpert MRSA Assay (FDA approved for NASAL specimens only), is one component of a comprehensive MRSA colonization surveillance program. It is not intended to diagnose MRSA infection nor to guide or monitor treatment for MRSA infections. Performed at [facility], [address] [place], [place] [address]     Radiology Reports Portable chest 1 View  Result Date: 10/07/2019 CLINICAL DATA:  Septic shock EXAM: PORTABLE CHEST 1 VIEW COMPARISON:  Two days ago FINDINGS: Low volume chest with mild vascular prominence. There is no edema, consolidation, effusion, or pneumothorax. Mild cardiomegaly accentuated by low volumes. IMPRESSION: Stable low volume chest. Electronically Signed   By: Monte Fantasia M.D.   On: 10/07/2019 05:37   DG Chest Port 1 View  Result Date: 10/05/2019 CLINICAL DATA:  Hypotension. Upper and lower extremity swelling. Possible sepsis. Ex-smoker. EXAM: PORTABLE CHEST 1 VIEW COMPARISON:  10/02/2019 FINDINGS: Stable enlarged cardiac silhouette, clear lungs and mildly prominent pulmonary vasculature. Thoracic spine degenerative changes. IMPRESSION: Stable cardiomegaly and mild pulmonary vascular congestion. Electronically Signed   By: Claudie Revering M.D.   On: 10/05/2019 19:26   DG Chest Portable 1 View  Result Date: 10/02/2019 CLINICAL DATA:  Leg swelling.  Leg pain. EXAM: PORTABLE CHEST 1 VIEW  COMPARISON:  None. FINDINGS: Mild cardiomegaly. Mild peribronchial thickening. No focal airspace disease, pleural effusion or pneumothorax. No acute osseous abnormalities are seen. IMPRESSION: Mild cardiomegaly. Mild peribronchial thickening may be chronic or seen with bronchitis. Electronically Signed   By: Keith Rake M.D.   On: 10/02/2019 23:55   ECHOCARDIOGRAM COMPLETE  Result Date: 10/06/2019    ECHOCARDIOGRAM REPORT   Patient Name:   JHALEN ELEY Date of Exam: 10/06/2019 Medical Rec #:  [id number]       Height:       64.0 in Accession #:    [id number]      Weight:       224.0 lb Date of Birth:  01/15/1946        BSA:          2.053 m Patient Age:    59 years        BP:           97/52 mmHg Patient Gender: M               HR:           97 bpm. Exam Location:  [facility] Procedure: 2D Echo Indications:    Hypotension [[id number]  History:        Patient has no prior history of Echocardiogram examinations.                 Risk Factors:Diabetes, Hypertension, Dyslipidemia and Former  Smoker. Hypotension.  Sonographer:    Leavy Cella RDCS (AE) Referring Phys: [place]  1. Left ventricular ejection fraction, by estimation, is 65 to 70%. The left ventricle has normal function. The left ventricle has no regional wall motion abnormalities. Left ventricular diastolic parameters are indeterminate.  2. Right ventricular systolic function is normal. The right ventricular size is normal. There is normal pulmonary artery systolic pressure. The estimated right ventricular systolic pressure is 86.7 mmHg.  3. The mitral valve is grossly normal. Trivial mitral valve regurgitation.  4. The aortic valve is tricuspid. Aortic valve regurgitation is not visualized.  5. The inferior vena cava is normal in size with greater than 50% respiratory variability, suggesting right atrial pressure of 3 mmHg. FINDINGS  Left Ventricle: Left ventricular ejection fraction, by estimation, is 65 to 70%.  The left ventricle has normal function. The left ventricle has no regional wall motion abnormalities. The left ventricular internal cavity size was normal in size. There is  borderline left ventricular hypertrophy. Left ventricular diastolic parameters are indeterminate. Right Ventricle: The right ventricular size is normal. No increase in right ventricular wall thickness. Right ventricular systolic function is normal. There is normal pulmonary artery systolic pressure. The tricuspid regurgitant velocity is 2.49 m/s, and  with an assumed right atrial pressure of 3 mmHg, the estimated right ventricular systolic pressure is 61.9 mmHg. Left Atrium: Left atrial size was normal in size. Right Atrium: Right atrial size was normal in size. Pericardium: There is no evidence of pericardial effusion. Presence of pericardial fat pad. Mitral Valve: The mitral valve is grossly normal. Trivial mitral valve regurgitation. Tricuspid Valve: The tricuspid valve is grossly normal. Tricuspid valve regurgitation is trivial. Aortic Valve: The aortic valve is tricuspid. Aortic valve regurgitation is not visualized. Mild aortic valve annular calcification. Pulmonic Valve: The pulmonic valve was grossly normal. Pulmonic valve regurgitation is trivial. Aorta: The aortic root is normal in size and structure. Venous: The inferior vena cava is normal in size with greater than 50% respiratory variability, suggesting right atrial pressure of 3 mmHg. IAS/Shunts: No atrial level shunt detected by color flow Doppler.  LEFT VENTRICLE PLAX 2D LVIDd:         4.90 cm  Diastology LVIDs:         3.06 cm  LV e' lateral:   8.81 cm/s LV PW:         1.14 cm  LV E/e' lateral: 11.3 LV IVS:        1.04 cm  LV e' medial:    6.74 cm/s LVOT diam:     1.80 cm  LV E/e' medial:  14.7 LVOT Area:     2.54 cm  RIGHT VENTRICLE RV S prime:     14.00 cm/s TAPSE (M-mode): 3.7 cm LEFT ATRIUM             Index       RIGHT ATRIUM           Index LA diam:        3.40 cm 1.66  cm/m  RA Area:     11.20 cm LA Vol (A2C):   75.4 ml 36.73 ml/m RA Volume:   21.80 ml  10.62 ml/m LA Vol (A4C):   37.1 ml 18.07 ml/m LA Biplane Vol: 55.3 ml 26.94 ml/m   AORTA Ao Root diam: 3.00 cm MITRAL VALVE               TRICUSPID VALVE MV Area (PHT): 5.16 cm  TR Peak grad:   24.8 mmHg MV Decel Time: 147 msec    TR Vmax:        249.00 cm/s MV E velocity: 99.40 cm/s MV A velocity: 76.70 cm/s  SHUNTS MV E/A ratio:  1.30        Systemic Diam: 1.80 cm Rozann Lesches MD Electronically signed by Rozann Lesches MD Signature Date/Time: 10/06/2019/11:31:33 AM    Final      CBC Recent Labs  Lab 10/05/19 1841 10/05/19 2317 10/06/19 0254 10/07/19 0536 10/08/19 0429  WBC 35.0* 33.5* 34.2* 24.9* 24.5*  HGB 11.1* 10.0* 10.0* 9.2* 9.4*  HCT 34.5* 30.9* 31.5* 29.1* 29.6*  PLT 106* 104* 105* 77* 64*  MCV 92.7 93.4 93.8 93.3 92.8  MCH 29.8 30.2 29.8 29.5 29.5  MCHC 32.2 32.4 31.7 31.6 31.8  RDW 17.2* 17.1* 17.2* 17.9* 18.1*  LYMPHSABS 2.3 2.5 1.9 2.7 2.8  MONOABS 0.7 0.7 2.1* 3.0* 3.0*  EOSABS 25.7* 24.3* 24.4* 14.7* 13.9*  BASOSABS 0.1 0.1 0.1 0.1 0.1    Chemistries  Recent Labs  Lab 10/02/19 2214 10/05/19 1841 10/07/19 0536 10/08/19 0429  NA 142 141 144 144  K 3.7 3.8 4.0 3.9  CL 109 111 113* 114*  CO2 22 22 22 23   GLUCOSE 174* 160* 88 80  BUN 13 18 20 20   CREATININE 0.93 1.03 1.13 1.06  CALCIUM 9.1 8.4* 8.4* 8.5*  MG  --   --  2.0 2.1  AST 20 34 23 21  ALT 36 46* 42 38  ALKPHOS 126 92 97 102  BILITOT 0.2* 0.2* 0.8 0.5   ------------------------------------------------------------------------------------------------------------------ No results for input(s): CHOL, HDL, LDLCALC, TRIG, CHOLHDL, LDLDIRECT in the last 72 hours.  Lab Results  Component Value Date   HGBA1C 6.9 (H) 10/06/2019   ------------------------------------------------------------------------------------------------------------------ Recent Labs    10/05/19 1841  TSH 2.008    ------------------------------------------------------------------------------------------------------------------ No results for input(s): VITAMINB12, FOLATE, FERRITIN, TIBC, IRON, RETICCTPCT in the last 72 hours.  Coagulation profile Recent Labs  Lab 10/05/19 1841  INR 1.3*    No results for input(s): DDIMER in the last 72 hours.  Cardiac Enzymes No results for input(s): CKMB, TROPONINI, MYOGLOBIN in the last 168 hours.  Invalid input(s): CK ------------------------------------------------------------------------------------------------------------------    Component Value Date/Time   BNP 55.0 10/05/2019 1841   Arloa Prak M.D on 10/08/2019 at 3:49 PM  Go to [email or web address] - for contact info  [facility] - Office  [phone]

## 2019-10-08 NOTE — TOC Initial Note (Addendum)
Transition of Care [facility]) - Initial/Assessment Note    Patient Details  Name: Cory Lynch MRN: [id number] Date of Birth: 06/16/1945  Transition of Care [facility]) CM/SW Contact:    Iona Beard, [place] Phone Number: 10/08/2019, 3:00 PM  Clinical Narrative:                 Pt admitted for Hypovolemic shock. Pt lives with his sister. Pt is able to perform ADL's. Pt does not have history with HH. Pt has a cane at home. Pt can drive. PT recommened SNF. Pt agreeable to going to SNF. TOC completed FL2 and sent in hub. Pts brother going to get vaccination card. TOC to follow.  Addendum: RN will make copy of vaccination card and leave it at nurses station. TOC will pick up copy of vaccination card.   Expected Discharge Plan: Skilled Nursing Facility Barriers to Discharge: Continued Medical Work up   Patient Goals and CMS Choice Patient states their goals for this hospitalization and ongoing recovery are:: SNF for rehab   Choice offered to / list presented to : Patient  Expected Discharge Plan and Services Expected Discharge Plan: [place]       Living arrangements for the past 2 months: Single Family Home                 DME Arranged: N/A DME Agency: NA       HH Arranged: NA [place] Agency: NA        Prior Living Arrangements/Services Living arrangements for the past 2 months: Single Family Home Lives with:: Siblings Patient language and need for interpreter reviewed:: Yes Do you feel safe going back to the place where you live?: Yes        Care giver support system in place?: Yes (comment)   Criminal Activity/Legal Involvement Pertinent to Current Situation/Hospitalization: No - Comment as needed  Activities of Daily Living Home Assistive Devices/Equipment: None ADL Screening (condition at time of admission) Patient's cognitive ability adequate to safely complete daily activities?: Yes Is the patient deaf or have difficulty hearing?: No Does the patient  have difficulty seeing, even when wearing glasses/contacts?: No Does the patient have difficulty concentrating, remembering, or making decisions?: No Patient able to express need for assistance with ADLs?: Yes Does the patient have difficulty dressing or bathing?: No Independently performs ADLs?: Yes (appropriate for developmental age) Does the patient have difficulty walking or climbing stairs?: No Weakness of Legs: None Weakness of Arms/Hands: None  Permission Sought/Granted                  Emotional Assessment Appearance:: Appears stated age Attitude/Demeanor/Rapport: Engaged Affect (typically observed): Accepting Orientation: : Oriented to Self, Oriented to Place, Oriented to  Time, Oriented to Situation   Psych Involvement: No (comment)  Admission diagnosis:  Hypoxia [R09.02] Hypotension [I95.9] Hypothermia, initial encounter [T68.XXXA] Septic shock ([place]) [A41.9, R65.21] Hypotension, unspecified hypotension type [I95.9] Sepsis with acute hypoxic respiratory failure, due to unspecified organism, unspecified whether septic shock present ([place]) [A41.9, R65.20, J96.01] Patient Active Problem List   Diagnosis Date Noted  . Hypothermia 10/06/2019  . Leukocytosis 10/06/2019  . Hypovolemic shock ([place]) 10/06/2019  . Mycosis fungoides ([place])   . Hypertension   . Hypercholesteremia   . Diabetes mellitus without complication ([place])   . Hypothyroid   . Anemia, unspecified   . Lactic acidosis   . Hypertriglyceridemia   . Drug-induced cytopenia   . Hyperglycemia   . Thrombocytopenia ([place])   .  Peripheral neuropathy   . Insomnia   . Hypotension 10/05/2019  . 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 [place] [address] Phone: [phone] Fax: [place], [place]  [place]. [place] [address] Phone: [phone] Fax: [phone]     Social Determinants of Health (SDOH) Interventions    Readmission Risk Interventions Readmission Risk Prevention Plan 10/08/2019  Transportation Screening Complete  PCP or Specialist Appt within 3-5 Days Complete  HRI or Home Care Consult Complete  Palliative Care Screening Not Applicable  Medication Review (RN Care Manager) Complete  Some recent data might be hidden

## 2019-10-08 NOTE — Evaluation (Addendum)
Physical Therapy Evaluation Patient Details Name: Cory Lynch MRN: [id number] DOB: 11-10-1945 Today's Date: 10/08/2019   History of Present Illness  Cory Lynch is a 74 y.o. male with medical history significant for mycosis fungoides with folliculotropic infiltrates in patch-plaque lesions as well as more infiltrative tumors and nodules most prominent on the bilateral upper extremities.  He is being treated for this by [facility] oncology with chemotherapy.  He started gemcitabine in August 2021.  He has received at least 2 treatments. 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  He was seen last week in the ER with complaints of several weeks of leg swelling.  He was prescribed lasix and compression stockings.  He says that he has been taking the lasix regularly.   He presented to ED by EMS on 9/6 with complaints of weakness.  He was noted to be hypotensive with BP 70/38.  He was cool and found to be hypothermic.  He was initially treated presumptively for a septic shock and started on IV antibiotics and received 4 liters of IV fluids.  He was accepted to be admitted to the ICU service at [facility].  While holding in the ED it was noted that his BP stabilized after IV fluids and he never required pressors.  He was placed on a bair hugger and his temperature started to improve.  His lactic acid was initially elevated at 2.7 but them quickly normalized after IV fluids and has remained normal.  He was noted to have marked leukocytosis but with his history of lymphocytosis and possibly CLL (he has not had a biopsy done to confirm CLL) he has a chronic leukocytosis and the clinical significance is difficult to determine.  He was evaluated by Dr. Melvyn Novas on 9/7 with pulmonary critical care.  He requested a procalcitonin that was 0.11 and cortisol.  He requested a 2D echocardiogram to rule out pericardial effusion.  He felt that  patient could be managed at [facility] and transfer to [facility] was cancelled.   The patient is being admitted for hypovolemic shock likely secondary to over diuresis and he will be admitted to stepdown ICU.  No source of infection has been found and sepsis has been ruled out.   Clinical Impression  The patient's O2 sat in the beginning of the session was 98% on RA. The patient had slow labored movements when attempting to sit at the edge of the chair. Upon standing with sit to stand min assist transfer, the patient was incontinent of stool. The patient tolerated standing balance with RW min guard for 8 minutes without LOB or fatigue. The patient ambulated 120 feet min guard with RW and O2 sat at 86% RA. He performed both sit to/from supine transfer mod assist. The patient seemed unaware of apparent deficits and required VC's for proper safety precautions. Patient tolerated sitting up in chair with after therapy with nursing in the room. PLAN: The patient will continue to benefit from skilled physical therapy services in hospital at recommended venue below in order to improve balance, gait, and ADL's to promote independence in functional activities.      Follow Up Recommendations SNF;Supervision/Assistance - 24 hour    Equipment Recommendations  Other (comment) (rolling walker)    Recommendations for Other Services       Precautions / Restrictions Precautions Precautions: Fall Restrictions Weight Bearing Restrictions: No      Mobility  Bed Mobility Overal bed mobility:  Needs Assistance Bed Mobility: Supine to Sit     Supine to sit: Mod assist     General bed mobility comments: slow, labored movement, VC's to use BUE for assistance  Transfers Overall transfer level: Needs assistance Equipment used: Rolling walker (2 wheeled) Transfers: Sit to/from Stand Sit to Stand: Min assist         General transfer comment: VC's to power up with BUE for assist, VC's for proper body  mechanics with walker; decreased strength requiring multiple attempts to stand  Ambulation/Gait Ambulation/Gait assistance: Min assist;Min guard Gait Distance (Feet): 120 Feet   Gait Pattern/deviations: Step-through pattern;Decreased stride length;Trunk flexed     General Gait Details: decreased  Stairs            Wheelchair Mobility    Modified Rankin (Stroke Patients Only)       Balance Overall balance assessment: Needs assistance Sitting-balance support: Bilateral upper extremity supported;Feet supported Sitting balance-Leahy Scale: Fair Sitting balance - Comments: decreased strength requiring extra time to sit up straight in chair   Standing balance support: Bilateral upper extremity supported;During functional activity Standing balance-Leahy Scale: Good Standing balance comment: heavy reliance on RW for weight acceptance                             Pertinent Vitals/Pain Pain Assessment: No/denies pain    Home Living Family/patient expects to be discharged to:: Private residence Living Arrangements: Children Available Help at Discharge: Family;Available 24 hours/day;Available PRN/intermittently (daughter 24/7, son prn) Type of Home: House Home Access: Stairs to enter Entrance Stairs-Rails: None Entrance Stairs-Number of Steps: 2   Home Equipment: Grab bars - tub/shower      Prior Function Level of Independence: Independent         Comments: feels as though he may need to start using RW     Hand Dominance   Dominant Hand: Right    Extremity/Trunk Assessment   Upper Extremity Assessment Upper Extremity Assessment: Generalized weakness    Lower Extremity Assessment Lower Extremity Assessment: Generalized weakness    Cervical / Trunk Assessment Cervical / Trunk Assessment: Normal  Communication   Communication: No difficulties  Cognition Arousal/Alertness: Awake/alert Behavior During Therapy: WFL for tasks  assessed/performed Overall Cognitive Status: Within Functional Limits for tasks assessed                                        General Comments      Exercises     Assessment/Plan    PT Assessment Patient needs continued PT services  PT Problem List Decreased strength;Decreased mobility;Decreased safety awareness;Decreased range of motion;Decreased coordination;Decreased activity tolerance;Decreased balance;Decreased knowledge of use of DME;Obesity       PT Treatment Interventions DME instruction;Therapeutic exercise;Gait training;Balance training;Functional mobility training;Therapeutic activities;Patient/family education    PT Goals (Current goals can be found in the Care Plan section)  Acute Rehab PT Goals Patient Stated Goal: go home PT Goal Formulation: With patient Time For Goal Achievement: 10/15/19 Potential to Achieve Goals: Good    Frequency Min 3X/week   Barriers to discharge        Co-evaluation               AM-PAC PT "6 Clicks" Mobility  Outcome Measure Help needed turning from your back to your side while in a flat bed without using bedrails?: None Help needed  moving from lying on your back to sitting on the side of a flat bed without using bedrails?: A Little Help needed moving to and from a bed to a chair (including a wheelchair)?: A Little Help needed standing up from a chair using your arms (e.g., wheelchair or bedside chair)?: A Little Help needed to walk in hospital room?: A Little Help needed climbing 3-5 steps with a railing? : A Lot 6 Click Score: 18    End of Session Equipment Utilized During Treatment: Gait belt Activity Tolerance: Patient tolerated treatment well Patient left: in chair;with call bell/phone within reach;with nursing/sitter in room Nurse Communication: Mobility status PT Visit Diagnosis: Unsteadiness on feet (R26.81);Other abnormalities of gait and mobility (R26.89);Muscle weakness (generalized)  (M62.81)    Time: 0254-2706 PT Time Calculation (min) (ACUTE ONLY): 35 min   Charges:   PT Evaluation $PT Eval Moderate Complexity: 1 Mod PT Treatments $Therapeutic Activity: 23-37 mins        11:24 AM , 10/08/19 Karlyn Agee, SPT Physical Therapy with [facility] [phone] office  During this treatment session, the therapist was present, participating in and directing the treatment.    11:24 AM, 10/08/19 Lonell Grandchild, MPT Physical Therapist with [facility] [phone] office [phone] mobile phone

## 2019-10-08 NOTE — NC FL2 (Signed)
[place] LEVEL OF CARE SCREENING TOOL     IDENTIFICATION  Patient Name: Cory Lynch Birthdate: 21-Mar-1945 Sex: male Admission Date (Current Location): 10/05/2019  [facility] and [place] Number:  [employer] and Address:  [place] [address], [place]      Provider Number: [id number]  Attending Physician Name and Address:  Roxan Hockey, MD  Relative Name and Phone Number:  Kendarious Gudino  [phone]    Current Level of Care: Hospital Recommended Level of Care: [place] Prior Approval Number:    Date Approved/Denied:   PASRR Number:    Discharge Plan: SNF    Current Diagnoses: Patient Active Problem List   Diagnosis Date Noted  . Hypothermia 10/06/2019  . Leukocytosis 10/06/2019  . Hypovolemic shock ([place]) 10/06/2019  . Mycosis fungoides ([place])   . Hypertension   . Hypercholesteremia   . Diabetes mellitus without complication ([place])   . Hypothyroid   . Anemia, unspecified   . Lactic acidosis   . Hypertriglyceridemia   . Drug-induced cytopenia   . Hyperglycemia   . Thrombocytopenia ([place])   . Peripheral neuropathy   . Insomnia   . Hypotension 10/05/2019  . CLL (chronic lymphocytic leukemia) ([place]) 10/05/2016    Orientation RESPIRATION BLADDER Height & Weight     Self, Time, Situation, Place  Normal Indwelling catheter Weight: 223 lb 12.3 oz (101.5 kg) Height:  5\' 4"  (162.6 cm)  BEHAVIORAL SYMPTOMS/MOOD NEUROLOGICAL BOWEL NUTRITION STATUS      Continent Diet (Carb modified at present/see discharge summary)  AMBULATORY STATUS COMMUNICATION OF NEEDS Skin   Extensive Assist Verbally Normal, Other (Comment) (Dry, flakey)                       Personal Care Assistance Level of Assistance  Bathing, Feeding, Dressing Bathing Assistance: Limited assistance Feeding assistance: Independent Dressing Assistance: Limited assistance     Functional Limitations Info  Sight, Hearing,  Speech Sight Info: Adequate Hearing Info: Adequate Speech Info: Adequate    SPECIAL CARE FACTORS FREQUENCY  PT (By licensed PT)     PT Frequency: 5 times weekly              Contractures Contractures Info: Not present    Additional Factors Info  Code Status Code Status Info: Full Allergies Info: N/A           Current Medications (10/08/2019):  This is the current hospital active medication list Current Facility-Administered Medications  Medication Dose Route Frequency Provider Last Rate Last Admin  . acetaminophen (TYLENOL) tablet 650 mg  650 mg Oral Q6H PRN Kapusta, Clanford L, MD       Or  . acetaminophen (TYLENOL) suppository 650 mg  650 mg Rectal Q6H PRN Polcyn, Clanford L, MD      . aspirin EC tablet 81 mg  81 mg Oral Daily Horst, Clanford L, MD   81 mg at 10/08/19 0911  . bisacodyl (DULCOLAX) EC tablet 5 mg  5 mg Oral Daily PRN Dyar, Clanford L, MD      . Chlorhexidine Gluconate Cloth 2 % PADS 6 each  6 each Topical Daily Wynetta Emery, Clanford L, MD   6 each at 10/08/19 0911  . enoxaparin (LOVENOX) injection 40 mg  40 mg Subcutaneous Q24H Gregori, Clanford L, MD   40 mg at 10/06/19 1553  . gabapentin (NEURONTIN) capsule 600 mg  600 mg Oral TID PRN Murlean Iba, MD      .  HYDROcodone-acetaminophen (NORCO/VICODIN) 5-325 MG per tablet 1 tablet  1 tablet Oral BID PRN Ebeling, Clanford L, MD      . insulin aspart (novoLOG) injection 0-15 Units  0-15 Units Subcutaneous TID WC Fuhr, Clanford L, MD   2 Units at 10/08/19 1139  . insulin aspart (novoLOG) injection 0-5 Units  0-5 Units Subcutaneous QHS Lebon, Clanford L, MD      . insulin aspart (novoLOG) injection 3 Units  3 Units Subcutaneous TID WC Esco, Clanford L, MD   3 Units at 10/08/19 1140  . lactated ringers infusion   Intravenous Continuous Wynetta Emery, Clanford L, MD 100 mL/hr at 10/08/19 0534 New Bag at 10/08/19 0534  . levothyroxine (SYNTHROID) tablet 125 mcg  125 mcg Oral Daily Wynetta Emery, Clanford L, MD    125 mcg at 10/08/19 0531  . loratadine (CLARITIN) tablet 10 mg  10 mg Oral Daily PRN Paternostro, Clanford L, MD      . omega-3 acid ethyl esters (LOVAZA) capsule 1 g  1 g Oral Daily Santistevan, Clanford L, MD   1 g at 10/08/19 0911  . ondansetron (ZOFRAN) tablet 4 mg  4 mg Oral Q6H PRN Donatelli, Clanford L, MD       Or  . ondansetron (ZOFRAN) injection 4 mg  4 mg Intravenous Q6H PRN Lunz, Clanford L, MD      . pantoprazole (PROTONIX) EC tablet 40 mg  40 mg Oral Q0600 Kegley, Clanford L, MD   40 mg at 10/08/19 0530  . zolpidem (AMBIEN) tablet 5 mg  5 mg Oral QHS PRN Wynetta Emery, Clanford L, MD   5 mg at 10/07/19 2133     Discharge Medications: Please see discharge summary for a list of discharge medications.  Relevant Imaging Results:  Relevant Lab Results:   Additional Information pt is medically ready  Iona Beard, LCSWA

## 2019-10-08 NOTE — Plan of Care (Addendum)
  Problem: Acute Rehab PT Goals(only PT should resolve) Goal: Pt Will Go Supine/Side To Sit Outcome: Progressing Flowsheets (Taken 10/08/2019 0958) Pt will go Supine/Side to Sit: . with minimal assist . with HOB elevated Goal: Patient Will Transfer Sit To/From Stand Outcome: Progressing Flowsheets (Taken 10/08/2019 0958) Patient will transfer sit to/from stand: with min guard assist Goal: Pt Will Transfer Bed To Chair/Chair To Bed Outcome: Progressing Flowsheets (Taken 10/08/2019 0958) Pt will Transfer Bed to Chair/Chair to Bed: min guard assist Goal: Pt Will Ambulate Outcome: Progressing Flowsheets (Taken 10/08/2019 0958) Pt will Ambulate: . > 125 feet . with min guard assist . with rolling walker   9:59 AM , 10/08/19 Karlyn Agee, SPT Physical Therapy with [facility] [phone] office  During this treatment session, the therapist was present, participating in and directing the treatment.  11:35 AM, 10/08/19 Lonell Grandchild, MPT Physical Therapist with [facility] [phone] office [phone] mobile phone

## 2019-10-09 ENCOUNTER — Ambulatory Visit (HOSPITAL_COMMUNITY): Payer: BC Managed Care – PPO | Attending: Neurology

## 2019-10-09 ENCOUNTER — Encounter (HOSPITAL_COMMUNITY): Payer: Self-pay

## 2019-10-09 LAB — GLUCOSE, CAPILLARY: Glucose-Capillary: 95 mg/dL (ref 70–99)

## 2019-10-09 LAB — COMPREHENSIVE METABOLIC PANEL
ALT: 34 U/L (ref 0–44)
AST: 21 U/L (ref 15–41)
Albumin: 2.9 g/dL — ABNORMAL LOW (ref 3.5–5.0)
Alkaline Phosphatase: 105 U/L (ref 38–126)
Anion gap: 7 (ref 5–15)
BUN: 20 mg/dL (ref 8–23)
CO2: 23 mmol/L (ref 22–32)
Calcium: 8.9 mg/dL (ref 8.9–10.3)
Chloride: 114 mmol/L — ABNORMAL HIGH (ref 98–111)
Creatinine, Ser: 0.99 mg/dL (ref 0.61–1.24)
GFR calc Af Amer: >60 mL/min (ref >60)
GFR calc non Af Amer: >60 mL/min (ref >60)
Glucose, Bld: 88 mg/dL (ref 70–99)
Potassium: 3.8 mmol/L (ref 3.5–5.1)
Sodium: 144 mmol/L (ref 135–145)
Total Bilirubin: 0.5 mg/dL (ref 0.3–1.2)
Total Protein: 5.8 g/dL — ABNORMAL LOW (ref 6.5–8.1)

## 2019-10-09 LAB — CBC WITH DIFFERENTIAL/PLATELET
Basophils Absolute: 0 10*3/uL (ref 0.0–0.1)
Basophils Relative: 0 %
Eosinophils Absolute: 14.8 10*3/uL — ABNORMAL HIGH (ref 0.0–0.5)
Eosinophils Relative: 55 %
HCT: 29.1 % — ABNORMAL LOW (ref 39.0–52.0)
Hemoglobin: 9.1 g/dL — ABNORMAL LOW (ref 13.0–17.0)
Lymphocytes Relative: 28 %
Lymphs Abs: 7.5 10*3/uL — ABNORMAL HIGH (ref 0.7–4.0)
MCH: 29 pg (ref 26.0–34.0)
MCHC: 31.3 g/dL (ref 30.0–36.0)
MCV: 92.7 fL (ref 80.0–100.0)
Monocytes Absolute: 0.5 10*3/uL (ref 0.1–1.0)
Monocytes Relative: 2 %
Neutro Abs: 4 10*3/uL (ref 1.7–7.7)
Neutrophils Relative %: 15 %
Platelets: 58 10*3/uL — ABNORMAL LOW (ref 150–400)
RBC: 3.14 MIL/uL — ABNORMAL LOW (ref 4.22–5.81)
RDW: 18.4 % — ABNORMAL HIGH (ref 11.5–15.5)
WBC: 26.9 10*3/uL — ABNORMAL HIGH (ref 4.0–10.5)
nRBC: 0.2 % (ref 0.0–0.2)

## 2019-10-09 LAB — PHOSPHORUS: Phosphorus: 2.7 mg/dL (ref 2.5–4.6)

## 2019-10-09 LAB — MAGNESIUM: Magnesium: 2.1 mg/dL (ref 1.7–2.4)

## 2019-10-09 MED ORDER — TAMSULOSIN HCL 0.4 MG PO CAPS: 0.4000 mg | ORAL_CAPSULE | Freq: Every day | ORAL | 11 refills | Status: AC

## 2019-10-09 MED ORDER — HYDROCODONE-ACETAMINOPHEN 5-325 MG PO TABS
1.0000 | ORAL_TABLET | Freq: Two times a day (BID) | ORAL | 0 refills | Status: AC | PRN
Start: 2019-10-09 — End: ?

## 2019-10-09 MED ORDER — INSULIN ASPART 100 UNIT/ML FLEXPEN: 0.0000 [IU] | PEN_INJECTOR | Freq: Three times a day (TID) | SUBCUTANEOUS | 11 refills | Status: AC

## 2019-10-09 MED ORDER — PANTOPRAZOLE SODIUM 40 MG PO TBEC: 40.0000 mg | DELAYED_RELEASE_TABLET | Freq: Every day | ORAL | 2 refills | Status: AC

## 2019-10-09 MED ORDER — ASPIRIN EC 81 MG PO TBEC: 81.0000 mg | DELAYED_RELEASE_TABLET | Freq: Every day | ORAL | 11 refills | Status: AC

## 2019-10-09 MED ORDER — ACETAMINOPHEN 325 MG PO TABS
650.0000 mg | ORAL_TABLET | Freq: Four times a day (QID) | ORAL | 0 refills | Status: AC | PRN
Start: 2019-10-09 — End: ?

## 2019-10-09 MED ORDER — FUROSEMIDE 20 MG PO TABS: 20.0000 mg | ORAL_TABLET | Freq: Every day | ORAL | 0 refills | Status: AC

## 2019-10-09 NOTE — TOC Transition Note (Signed)
Transition of Care [facility]) - CM/SW Discharge Note   Patient Details  Name: Cory Lynch MRN: [id number] Date of Birth: 24-Feb-1945  Transition of Care [facility]) CM/SW Contact:  Iona Beard, [place] Phone Number: 10/09/2019, 10:48 AM   Clinical Narrative:    Pt discharging to [facility]. TOC sent D/C summary in hub. MD and RN updated. RN to call report. EMS scheduled. TOC signing off.    Final next level of care: Skilled Nursing Facility Barriers to Discharge: Barriers Resolved   Patient Goals and CMS Choice Patient states their goals for this hospitalization and ongoing recovery are:: SNF for rehab   Choice offered to / list presented to : Patient  Discharge Placement                Patient to be transferred to facility by: EMS Name of family member notified: Wess Botts Patient and family notified of of transfer: 10/09/19  Discharge Plan and Services                DME Arranged: N/A DME Agency: NA       HH Arranged: NA HH Agency: NA        Social Determinants of Health (SDOH) Interventions     Readmission Risk Interventions Readmission Risk Prevention Plan 10/08/2019  Transportation Screening Complete  PCP or Specialist Appt within 3-5 Days Complete  HRI or Home Care Consult Complete  Palliative Care Screening Not Applicable  Medication Review (RN Care Manager) Complete  Some recent data might be hidden

## 2019-10-09 NOTE — Discharge Instructions (Signed)
1)Very low-salt diet advised 2)Weigh yourself daily, call if you gain more than 3 pounds in 1 day or more than 5 pounds in 1 week as your diuretic medications may need to be adjusted 3)Limit your Fluid  intake to no more than 60 ounces (1.8 Liters) per day 4)Avoid ibuprofen/Advil/Aleve/Motrin/Goody Powders/Naproxen/BC powders/Meloxicam/Diclofenac/Indomethacin and other Nonsteroidal anti-inflammatory medications as these will make you more likely to bleed and can cause stomach ulcers, can also cause Kidney problems.  5)Repeat CBC and BMP-- in 1 week 6)Please follow up with [facility] oncology in 2 weeks for recheck and re-evaluation 7)insulin aspart (novoLOG) injection 0-10 Units  0-10 Units Subcutaneous, 3 times daily with meals  CBG < 70: Implement Hypoglycemia Standing Orders and refer to Hypoglycemia Standing Orders sidebar report   CBG 70 - 120: 0 unit CBG 121 - 150: 0 unit  CBG 151 - 200: 1 unit  CBG 201 - 250: 2 units  CBG 251 - 300: 4 units  CBG 301 - 350: 6 units   CBG 351 - 400: 8 units  CBG > 400: 10 units

## 2019-10-09 NOTE — Progress Notes (Signed)
Attempted to call report to [facility] in [place] twice however, unable to get connected with a nurse. Will attempt again later.

## 2019-10-09 NOTE — Discharge Summary (Signed)
Cory Lynch, is a 74 y.o. male  DOB 11-02-1945  MRN [id number].  Admission date:  10/05/2019  Admitting Physician  Murlean Iba, MD  Discharge Date:  10/09/2019   Primary MD  [facility]  Recommendations for primary care physician for things to follow:   1)Very low-salt diet advised 2)Weigh yourself daily, call if you gain more than 3 pounds in 1 day or more than 5 pounds in 1 week as your diuretic medications may need to be adjusted 3)Limit your Fluid  intake to no more than 60 ounces (1.8 Liters) per day 4)Avoid ibuprofen/Advil/Aleve/Motrin/Goody Powders/Naproxen/BC powders/Meloxicam/Diclofenac/Indomethacin and other Nonsteroidal anti-inflammatory medications as these will make you more likely to bleed and can cause stomach ulcers, can also cause Kidney problems.  5)Repeat CBC and BMP-- in 1 week 6)Please follow up with [facility] oncology in 2 weeks for recheck and re-evaluation  Admission Diagnosis  Hypoxia [R09.02] Hypotension [I95.9] Hypothermia, initial encounter [T68.XXXA] Septic shock ([place]) [A41.9, R65.21] Hypotension, unspecified hypotension type [I95.9] Sepsis with acute hypoxic respiratory failure, due to unspecified organism, unspecified whether septic shock present ([place]) [A41.9, R65.20, J96.01]   Discharge Diagnosis  Hypoxia [R09.02] Hypotension [I95.9] Hypothermia, initial encounter [T68.XXXA] Septic shock ([place]) [A41.9, R65.21] Hypotension, unspecified hypotension type [I95.9] Sepsis with acute hypoxic respiratory failure, due to unspecified organism, unspecified whether septic shock present ([place]) [A41.9, R65.20, J96.01]   Principal Problem:   Hypovolemic shock ([place]) Active Problems:   Hypotension   Hypothermia   Leukocytosis   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 Diabetes mellitus without complication ([place])   . Hypercholesteremia   . Hypertension   . Hypothyroid   . 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 CYSTOSCOPY WITH LITHOLAPAXY N/A 09/17/2016   Procedure: CYSTOSCOPY WITH LITHOLAPAXY;  Surgeon: Cleon Gustin, MD;  Location: [facility];  Service: Urology;  Laterality: N/A;  30 MINS [phone] [id number]  . HOLMIUM LASER APPLICATION N/A 08/13/9676   Procedure: HOLMIUM LASER APPLICATION;  Surgeon: Cleon Gustin, MD;  Location: [facility];  Service: Urology;  Laterality: N/A;     HPI  from the history and physical done on the day of admission:   Chief Complaint: weakness  HPI: Cory Lynch is a 74 y.o. male with medical history significant for mycosis fungoides with folliculotropic infiltrates in patch-plaque lesions as well as more infiltrative tumors and nodules most prominent on the bilateral upper extremities.  He is being treated for this by [facility] oncology with chemotherapy.  He started gemcitabine in August 2021.  He has received at least 2 treatments. 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   He was seen last week in the ER with complaints of several weeks of leg swelling.  He was prescribed lasix and compression stockings.  He says that he has been taking the lasix regularly.    He presented to ED by EMS on 9/6 with complaints of weakness.  He was noted to be hypotensive with BP 70/38.  He was cool and found to be hypothermic.  He was initially treated presumptively for a septic shock and started on IV antibiotics and received 4 liters of IV fluids.  He was  accepted to be admitted to the ICU service at [facility]  While holding in the ED it was noted that his BP stabilized after IV fluids and he never required pressors.  He was placed on a bair hugger and his temperature started to improve.  His lactic acid was initially elevated at 2.7 but them quickly normalized after IV fluids and has remained normal.   He was noted to have marked leukocytosis but with his history of lymphocytosis and possibly CLL (he has not had a biopsy done to confirm CLL) he has a chronic leukocytosis and the clinical significance is difficult to determine.   He was evaluated by Dr. Melvyn Novas on 9/7 with pulmonary critical care.  He requested a procalcitonin that was 0.11 and cortisol.  He requested a 2D echocardiogram to rule out pericardial effusion.  He felt that patient could be managed at [facility] and transfer to [facility] was cancelled.    The patient is being admitted for hypovolemic shock likely secondary to over diuresis and he will be admitted to stepdown ICU.  No source of infection has been found and sepsis has been ruled out.       Hospital Course:    Brief Summary:- 74 y.o.malewith medical history significantfor mycosis fungoides with folliculotropic infiltrates in patch-plaque lesions as well as more infiltrative tumors and nodules most prominent on the bilateral upper extremities. He is being treated for this by [facility] oncology with chemotherapy. He started gemcitabine in August 2021. He has received at least 2 treatments. 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, hypertriglyceridemia and cytopenia secondary to chemotherapy admitted on 10/06/2019 with hemodynamic instability and persistent hypotension without definite source of infection Transfer to SNF rehab   A/p  1)Hypovolemic Shock--- suspect secondary to diuretics -Improved after aggressive IV fluids and discontinuation of diuretics Cortisol WNL -Lactic acidosis  resolved with IV fluids -Urine and blood cultures NGTD PCT 0.11 >>0.11>>  < 0.10 WBC >> 33.5 >>34.2 >>24.9 >> 24.5  (??CLL) -Sepsis ruled out  -Echo with EF of 65 to 70% without regional wall motion abnormalities  2)Hypothermia--able to come off warming blanket, temperature has normalized TSH - WNL Cortisol WNL  3)Mycosis Fungoides/ Lymphoma T cell - Pt is being at [facility] oncology and was due to be seen on 10/08/19. He is actively being treated with chemotherapy-  started gemcitabine in August 26th 2021. 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 Follow up with [facility] oncology. They noted that even if CLL were present no treatment would be indicated at this time. -Patient will need to hold off on further chemotherapy at this for the next couple of weeks given febrile illness  6)Drug-induced hypothyroidism--- continue levothyroxine  7)DM2-hemoglobin A1c 6.9, use sliding scale coverage   8) generalized weakness and deconditioning--- physical therapy evaluation appreciated recommends SNF rehab  Disposition/--- transfer to SNF rehab  Code Status : full  Family Communication:    (patient is alert, awake and coherent)  Discussed with pt's Brother  Consults  :  PCCM  Discharge Condition: stable  Follow UP   Contact information for follow-up providers    [facility] Call in 3 week(s).   Contact information: [address] [place] [address] [phone]            Contact information for after-discharge care    [facility] .   Service: Skilled Nursing Contact information: [address] [place] [phone]                    Consults obtained -   Diet and Activity recommendation:  As advised  Discharge Instructions    Discharge Instructions    Call MD for:  difficulty breathing, headache or visual disturbances   Complete by: As directed    Call MD for:  persistant dizziness or light-headedness   Complete by: As directed    Call MD for:  persistant nausea and vomiting   Complete by: As directed    Call MD for:  severe uncontrolled pain   Complete by: As directed    Call MD for:  temperature >100.4   Complete by: As directed    Diet - low sodium heart healthy   Complete by: As directed    Discharge instructions   Complete by: As directed    1)Very low-salt diet advised 2)Weigh yourself daily, call if you gain more than 3 pounds in 1 day or more than 5 pounds in 1 week as your diuretic medications may need to be adjusted 3)Limit your Fluid  intake to no more than 60 ounces (1.8 Liters) per day 4)Avoid ibuprofen/Advil/Aleve/Motrin/Goody Powders/Naproxen/BC powders/Meloxicam/Diclofenac/Indomethacin and other Nonsteroidal anti-inflammatory medications as these will make you more likely to bleed and can cause stomach ulcers, can also cause Kidney problems.  5)Repeat CBC and BMP-- in 1 week 6)Please follow up with [facility] oncology in 2 weeks for recheck and re-evaluation 7)insulin aspart (novoLOG) injection 0-10 Units  0-10 Units Subcutaneous, 3 times daily with meals  CBG < 70: Implement Hypoglycemia Standing Orders and refer to Hypoglycemia Standing Orders sidebar report   CBG 70 - 120: 0 unit CBG 121 - 150: 0 unit  CBG 151 - 200: 1 unit  CBG 201 - 250: 2 units  CBG 251 - 300: 4 units  CBG 301 - 350: 6 units   CBG 351 - 400: 8 units  CBG > 400: 10 units   Increase activity slowly   Complete by: As directed         Discharge Medications     Allergies as of 10/09/2019   No Known Allergies     Medication List    STOP taking these medications   traMADol 50 MG tablet Commonly  known as: ULTRAM     TAKE these medications   acetaminophen 325 MG tablet Commonly known as: TYLENOL Take 2 tablets (650 mg total) by mouth every 6 (six) hours as needed for mild pain (or Fever >/= 101).   aspirin EC 81 MG tablet Take 1 tablet (81 mg total) by mouth daily with breakfast. Swallow whole. What changed: when to take  this   cetirizine 10 MG tablet Commonly known as: ZYRTEC Take 1 tablet by mouth daily.   Fish Oil 1000 MG Caps Take 1 capsule by mouth daily.   furosemide 20 MG tablet Commonly known as: LASIX Take 1 tablet (20 mg total) by mouth daily. What changed:   how much to take  when to take this  reasons to take this   gabapentin 600 MG tablet Commonly known as: NEURONTIN Take 600 mg by mouth 3 (three) times daily.   HYDROcodone-acetaminophen 5-325 MG tablet Commonly known as: NORCO/VICODIN Take 1 tablet by mouth 2 (two) times daily as needed.   insulin aspart 100 UNIT/ML FlexPen Commonly known as: NOVOLOG Inject 0-10 Units into the skin 3 (three) times daily with meals. insulin aspart (novoLOG) injection 0-10 Units 0-10 Units Subcutaneous, 3 times daily with meals CBG < 70: Implement Hypoglycemia Standing Orders and refer to Hypoglycemia Standing Orders sidebar report  CBG 70 - 120: 0 unit CBG 121 - 150: 0 unit  CBG 151 - 200: 1 unit CBG 201 - 250: 2 units CBG 251 - 300: 4 units CBG 301 - 350: 6 units  CBG 351 - 400: 8 units  CBG > 400: 10 units   levothyroxine 125 MCG tablet Commonly known as: SYNTHROID Take 125 mcg by mouth daily.   pantoprazole 40 MG tablet Commonly known as: PROTONIX Take 1 tablet (40 mg total) by mouth daily at 6 (six) AM.   tamsulosin 0.4 MG Caps capsule Commonly known as: FLOMAX Take 1 capsule (0.4 mg total) by mouth daily after supper.   zolpidem 5 MG tablet Commonly known as: AMBIEN Take 5 mg by mouth at bedtime as needed.       Major procedures and Radiology Reports - PLEASE review detailed and final reports for  all details, in brief -   Portable chest 1 View  Result Date: 10/07/2019 CLINICAL DATA:  Septic shock EXAM: PORTABLE CHEST 1 VIEW COMPARISON:  Two days ago FINDINGS: Low volume chest with mild vascular prominence. There is no edema, consolidation, effusion, or pneumothorax. Mild cardiomegaly accentuated by low volumes. IMPRESSION: Stable low volume chest. Electronically Signed   By: Monte Fantasia M.D.   On: 10/07/2019 05:37   DG Chest Port 1 View  Result Date: 10/05/2019 CLINICAL DATA:  Hypotension. Upper and lower extremity swelling. Possible sepsis. Ex-smoker. EXAM: PORTABLE CHEST 1 VIEW COMPARISON:  10/02/2019 FINDINGS: Stable enlarged cardiac silhouette, clear lungs and mildly prominent pulmonary vasculature. Thoracic spine degenerative changes. IMPRESSION: Stable cardiomegaly and mild pulmonary vascular congestion. Electronically Signed   By: Claudie Revering M.D.   On: 10/05/2019 19:26   DG Chest Portable 1 View  Result Date: 10/02/2019 CLINICAL DATA:  Leg swelling.  Leg pain. EXAM: PORTABLE CHEST 1 VIEW COMPARISON:  None. FINDINGS: Mild cardiomegaly. Mild peribronchial thickening. No focal airspace disease, pleural effusion or pneumothorax. No acute osseous abnormalities are seen. IMPRESSION: Mild cardiomegaly. Mild peribronchial thickening may be chronic or seen with bronchitis. Electronically Signed   By: Keith Rake M.D.   On: 10/02/2019 23:55   ECHOCARDIOGRAM COMPLETE  Result Date: 10/06/2019    ECHOCARDIOGRAM REPORT   Patient Name:   Cory Lynch Date of Exam: 10/06/2019 Medical Rec #:  [id number]       Height:       64.0 in Accession #:    [id number]      Weight:       224.0 lb Date of Birth:  05/31/1945  BSA:          2.053 m Patient Age:    14 years        BP:           97/52 mmHg Patient Gender: M               HR:           97 bpm. Exam Location:  [facility] Procedure: 2D Echo Indications:    Hypotension [[id number]  History:        Patient has no prior history of Echocardiogram  examinations.                 Risk Factors:Diabetes, Hypertension, Dyslipidemia and Former                 Smoker. Hypotension.  Sonographer:    Leavy Cella RDCS (AE) Referring Phys: [place]  1. Left ventricular ejection fraction, by estimation, is 65 to 70%. The left ventricle has normal function. The left ventricle has no regional wall motion abnormalities. Left ventricular diastolic parameters are indeterminate.  2. Right ventricular systolic function is normal. The right ventricular size is normal. There is normal pulmonary artery systolic pressure. The estimated right ventricular systolic pressure is 23.5 mmHg.  3. The mitral valve is grossly normal. Trivial mitral valve regurgitation.  4. The aortic valve is tricuspid. Aortic valve regurgitation is not visualized.  5. The inferior vena cava is normal in size with greater than 50% respiratory variability, suggesting right atrial pressure of 3 mmHg. FINDINGS  Left Ventricle: Left ventricular ejection fraction, by estimation, is 65 to 70%. The left ventricle has normal function. The left ventricle has no regional wall motion abnormalities. The left ventricular internal cavity size was normal in size. There is  borderline left ventricular hypertrophy. Left ventricular diastolic parameters are indeterminate. Right Ventricle: The right ventricular size is normal. No increase in right ventricular wall thickness. Right ventricular systolic function is normal. There is normal pulmonary artery systolic pressure. The tricuspid regurgitant velocity is 2.49 m/s, and  with an assumed right atrial pressure of 3 mmHg, the estimated right ventricular systolic pressure is 36.1 mmHg. Left Atrium: Left atrial size was normal in size. Right Atrium: Right atrial size was normal in size. Pericardium: There is no evidence of pericardial effusion. Presence of pericardial fat pad. Mitral Valve: The mitral valve is grossly normal. Trivial mitral valve  regurgitation. Tricuspid Valve: The tricuspid valve is grossly normal. Tricuspid valve regurgitation is trivial. Aortic Valve: The aortic valve is tricuspid. Aortic valve regurgitation is not visualized. Mild aortic valve annular calcification. Pulmonic Valve: The pulmonic valve was grossly normal. Pulmonic valve regurgitation is trivial. Aorta: The aortic root is normal in size and structure. Venous: The inferior vena cava is normal in size with greater than 50% respiratory variability, suggesting right atrial pressure of 3 mmHg. IAS/Shunts: No atrial level shunt detected by color flow Doppler.  LEFT VENTRICLE PLAX 2D LVIDd:         4.90 cm  Diastology LVIDs:         3.06 cm  LV e' lateral:   8.81 cm/s LV PW:         1.14 cm  LV E/e' lateral: 11.3 LV IVS:        1.04 cm  LV e' medial:    6.74 cm/s LVOT diam:     1.80 cm  LV E/e' medial:  14.7 LVOT Area:     2.54 cm  RIGHT VENTRICLE RV S prime:     14.00 cm/s TAPSE (M-mode): 3.7 cm LEFT ATRIUM             Index       RIGHT ATRIUM           Index LA diam:        3.40 cm 1.66 cm/m  RA Area:     11.20 cm LA Vol (A2C):   75.4 ml 36.73 ml/m RA Volume:   21.80 ml  10.62 ml/m LA Vol (A4C):   37.1 ml 18.07 ml/m LA Biplane Vol: 55.3 ml 26.94 ml/m   AORTA Ao Root diam: 3.00 cm MITRAL VALVE               TRICUSPID VALVE MV Area (PHT): 5.16 cm    TR Peak grad:   24.8 mmHg MV Decel Time: 147 msec    TR Vmax:        249.00 cm/s MV E velocity: 99.40 cm/s MV A velocity: 76.70 cm/s  SHUNTS MV E/A ratio:  1.30        Systemic Diam: 1.80 cm Rozann Lesches MD Electronically signed by Rozann Lesches MD Signature Date/Time: 10/06/2019/11:31:33 AM    Final     Micro Results   Recent Results (from the past 240 hour(s))  SARS Coronavirus 2 by RT PCR (hospital order, performed in [facility] hospital lab) Nasopharyngeal Nasopharyngeal Swab     Status: None   Collection Time: 10/05/19  6:18 PM   Specimen: Nasopharyngeal Swab  Result Value Ref Range Status   SARS Coronavirus  2 NEGATIVE NEGATIVE Final    Comment: (NOTE) SARS-CoV-2 target nucleic acids are NOT DETECTED.  The SARS-CoV-2 RNA is generally detectable in upper and lower respiratory specimens during the acute phase of infection. The lowest concentration of SARS-CoV-2 viral copies this assay can detect is 250 copies / mL. A negative result does not preclude SARS-CoV-2 infection and should not be used as the sole basis for treatment or other patient management decisions.  A negative result may occur with improper specimen collection / handling, submission of specimen other than nasopharyngeal swab, presence of viral mutation(s) within the areas targeted by this assay, and inadequate number of viral copies (<250 copies / mL). A negative result must be combined with clinical observations, patient history, and epidemiological information.  Fact Sheet for Patients:   StrictlyIdeas.no  Fact Sheet for Healthcare Providers: BankingDealers.co.za  This test is not yet approved or  cleared by the [place] FDA and has been authorized for detection and/or diagnosis of SARS-CoV-2 by FDA under an Emergency Use Authorization (EUA).  This EUA will remain in effect (meaning this test can be used) for the duration of the COVID-19 declaration under Section 564(b)(1) of the Act, 21 U.S.C. section 360bbb-3(b)(1), unless the authorization is terminated or revoked sooner.  Performed at [facility], [address] [place], [place] [address]   Blood Culture (routine x 2)     Status: None (Preliminary result)   Collection Time: 10/05/19  6:41 PM   Specimen: BLOOD RIGHT FOREARM  Result Value Ref Range Status   Specimen Description BLOOD RIGHT FOREARM  Final   Special Requests   Final    BOTTLES DRAWN AEROBIC AND ANAEROBIC Blood Culture adequate volume   Culture   Final    NO GROWTH 3 DAYS Performed at [facility], [address] [place],  [address]     Report Status PENDING  Incomplete  Blood Culture (routine  x 2)     Status: None (Preliminary result)   Collection Time: 10/05/19  9:07 PM   Specimen: BLOOD RIGHT HAND  Result Value Ref Range Status   Specimen Description BLOOD RIGHT HAND  Final   Special Requests   Final    BOTTLES DRAWN AEROBIC AND ANAEROBIC Blood Culture adequate volume   Culture   Final    NO GROWTH 3 DAYS Performed at [facility], [address] [place], [place] [address]    Report Status PENDING  Incomplete  Urine culture     Status: None   Collection Time: 10/05/19  9:10 PM   Specimen: In/Out Cath Urine  Result Value Ref Range Status   Specimen Description   Final    IN/OUT CATH URINE Performed at [facility], [address], [place], [place] [address]    Special Requests   Final    NONE Performed at [facility], [address] [place], [place] [address]    Culture   Final    NO GROWTH Performed at [facility], [place] [address] [place], [place] [address]    Report Status 10/07/2019 FINAL  Final  MRSA PCR Screening     Status: None   Collection Time: 10/06/19  6:45 PM   Specimen: Nasal Mucosa; Nasopharyngeal  Result Value Ref Range Status   MRSA by PCR NEGATIVE NEGATIVE Final    Comment:        The GeneXpert MRSA Assay (FDA approved for NASAL specimens only), is one component of a comprehensive MRSA colonization surveillance program. It is not intended to diagnose MRSA infection nor to guide or monitor treatment for MRSA infections. Performed at [facility], [address], [place], [place] [address]   SARS Coronavirus 2 by RT PCR (hospital order, performed in [facility] hospital lab) Nasopharyngeal Nasopharyngeal Swab     Status: None   Collection Time: 10/08/19  3:40 PM   Specimen: Nasopharyngeal Swab  Result Value Ref Range Status   SARS Coronavirus 2 NEGATIVE NEGATIVE Final    Comment: (NOTE) SARS-CoV-2 target nucleic acids are NOT DETECTED.  The SARS-CoV-2 RNA is generally  detectable in upper and lower respiratory specimens during the acute phase of infection. The lowest concentration of SARS-CoV-2 viral copies this assay can detect is 250 copies / mL. A negative result does not preclude SARS-CoV-2 infection and should not be used as the sole basis for treatment or other patient management decisions.  A negative result may occur with improper specimen collection / handling, submission of specimen other than nasopharyngeal swab, presence of viral mutation(s) within the areas targeted by this assay, and inadequate number of viral copies (<250 copies / mL). A negative result must be combined with clinical observations, patient history, and epidemiological information.  Fact Sheet for Patients:   StrictlyIdeas.no  Fact Sheet for Healthcare Providers: BankingDealers.co.za  This test is not yet approved or  cleared by the [place] FDA and has been authorized for detection and/or diagnosis of SARS-CoV-2 by FDA under an Emergency Use Authorization (EUA).  This EUA will remain in effect (meaning this test can be used) for the duration of the COVID-19 declaration under Section 564(b)(1) of the Act, 21 U.S.C. section 360bbb-3(b)(1), unless the authorization is terminated or revoked sooner.  Performed at [facility], [address], [place],  [address]     Today   Subjective    Cory Lynch today has no new concerns     -Eating and drinking well  Patient has been seen and examined prior to discharge   Objective   Blood pressure (!) 161/73, pulse 81, temperature (!) 97.5 F (36.4 C), temperature source Oral, resp. rate (!) 22, height 5\' 4"  (1.626 m), weight 102.1 kg, SpO2 99 %. Temp:  [97.5 F (36.4 C)-98.3 F (36.8 C)] 97.5 F (36.4 C) (09/10 0800) Pulse Rate:  [73-96] 81 (09/10 0949) Resp:  [16-28] 22 (09/10 0949) BP: (110-161)/(43-82) 161/73 (09/10 0949) SpO2:  [91 %-100 %] 99 %  (09/10 0949) FiO2 (%):  [24 %] 24 % (09/09 2100) Weight:  [102.1 kg] 102.1 kg (09/10 0500)   Intake/Output Summary (Last 24 hours) at 10/09/2019 1023 Last data filed at 10/09/2019 0500 Gross per 24 hour  Intake 3000 ml  Output 500 ml  Net 2500 ml    Exam Gen:- Awake Alert,  In no apparent distress  HEENT:- [place].AT, No sclera icterus Neck-Supple Neck,No JVD,.  Lungs-  CTAB , fair symmetrical air movement CV- S1, S2 normal, regular  Abd-  +ve B.Sounds, Abd Soft, No tenderness,    Extremity- +ve  edema, pedal pulses present  Psych-affect is appropriate, oriented x3 Neuro-no new focal deficits, no tremors -Skin--cutaneous lesions noted   Data Review   CBC w Diff:  Lab Results  Component Value Date   WBC 26.9 (H) 10/09/2019   HGB 9.1 (L) 10/09/2019   HCT 29.1 (L) 10/09/2019   PLT 58 (L) 10/09/2019   LYMPHOPCT 28 10/09/2019   MONOPCT 2 10/09/2019   EOSPCT 55 10/09/2019   BASOPCT 0 10/09/2019    CMP:  Lab Results  Component Value Date   NA 144 10/09/2019   K 3.8 10/09/2019   CL 114 (H) 10/09/2019   CO2 23 10/09/2019   BUN 20 10/09/2019   CREATININE 0.99 10/09/2019   PROT 5.8 (L) 10/09/2019   ALBUMIN 2.9 (L) 10/09/2019   BILITOT 0.5 10/09/2019   ALKPHOS 105 10/09/2019   AST 21 10/09/2019   ALT 34 10/09/2019  .   Total Discharge time is about 33 minutes  Roxan Hockey M.D on 10/09/2019 at 10:23 AM  Go to [email or web address] -  for contact info  [facility] - Office  [phone]

## 2019-10-10 LAB — CULTURE, BLOOD (ROUTINE X 2)
Culture: NO GROWTH
Culture: NO GROWTH
Special Requests: ADEQUATE
Special Requests: ADEQUATE

## 2019-10-15 ENCOUNTER — Ambulatory Visit: Admit: 2019-10-15 | Discharge: 2019-10-15 | Payer: MEDICARE

## 2019-10-15 ENCOUNTER — Other Ambulatory Visit: Admit: 2019-10-15 | Discharge: 2019-10-15 | Payer: MEDICARE

## 2019-10-15 DIAGNOSIS — C84A1 Cutaneous T-cell lymphoma, unspecified lymph nodes of head, face, and neck: Principal | ICD-10-CM

## 2019-10-15 DIAGNOSIS — C8400 Mycosis fungoides, unspecified site: Principal | ICD-10-CM

## 2019-10-15 DIAGNOSIS — E032 Hypothyroidism due to medicaments and other exogenous substances: Principal | ICD-10-CM

## 2019-10-15 DIAGNOSIS — E785 Hyperlipidemia, unspecified: Principal | ICD-10-CM

## 2019-10-22 ENCOUNTER — Ambulatory Visit: Admit: 2019-10-22 | Payer: MEDICARE

## 2019-10-23 ENCOUNTER — Inpatient Hospital Stay
Admission: EM | Admit: 2019-10-23 | Discharge: 2019-10-30 | DRG: 871 | Disposition: E | Payer: Medicare (Managed Care) | Source: Skilled Nursing Facility | Attending: Pulmonary Disease | Admitting: Pulmonary Disease

## 2019-10-23 ENCOUNTER — Emergency Department: Payer: Medicare (Managed Care)

## 2019-10-23 ENCOUNTER — Other Ambulatory Visit: Payer: Self-pay

## 2019-10-23 DIAGNOSIS — E032 Hypothyroidism due to medicaments and other exogenous substances: Secondary | ICD-10-CM | POA: Diagnosis present

## 2019-10-23 DIAGNOSIS — E785 Hyperlipidemia, unspecified: Secondary | ICD-10-CM | POA: Diagnosis present

## 2019-10-23 DIAGNOSIS — I469 Cardiac arrest, cause unspecified: Secondary | ICD-10-CM | POA: Diagnosis not present

## 2019-10-23 DIAGNOSIS — Z79890 Hormone replacement therapy (postmenopausal): Secondary | ICD-10-CM

## 2019-10-23 DIAGNOSIS — Z66 Do not resuscitate: Secondary | ICD-10-CM | POA: Diagnosis present

## 2019-10-23 DIAGNOSIS — Z7982 Long term (current) use of aspirin: Secondary | ICD-10-CM

## 2019-10-23 DIAGNOSIS — J9601 Acute respiratory failure with hypoxia: Secondary | ICD-10-CM | POA: Diagnosis present

## 2019-10-23 DIAGNOSIS — Y95 Nosocomial condition: Secondary | ICD-10-CM | POA: Diagnosis present

## 2019-10-23 DIAGNOSIS — J189 Pneumonia, unspecified organism: Secondary | ICD-10-CM | POA: Diagnosis present

## 2019-10-23 DIAGNOSIS — C8400 Mycosis fungoides, unspecified site: Secondary | ICD-10-CM | POA: Diagnosis present

## 2019-10-23 DIAGNOSIS — N179 Acute kidney failure, unspecified: Secondary | ICD-10-CM | POA: Diagnosis present

## 2019-10-23 DIAGNOSIS — Z20822 Contact with and (suspected) exposure to covid-19: Secondary | ICD-10-CM | POA: Diagnosis present

## 2019-10-23 DIAGNOSIS — G8194 Hemiplegia, unspecified affecting left nondominant side: Secondary | ICD-10-CM | POA: Diagnosis present

## 2019-10-23 DIAGNOSIS — Z856 Personal history of leukemia: Secondary | ICD-10-CM

## 2019-10-23 DIAGNOSIS — E781 Pure hyperglyceridemia: Secondary | ICD-10-CM | POA: Diagnosis present

## 2019-10-23 DIAGNOSIS — I10 Essential (primary) hypertension: Secondary | ICD-10-CM | POA: Diagnosis present

## 2019-10-23 DIAGNOSIS — Z809 Family history of malignant neoplasm, unspecified: Secondary | ICD-10-CM

## 2019-10-23 DIAGNOSIS — E119 Type 2 diabetes mellitus without complications: Secondary | ICD-10-CM | POA: Diagnosis present

## 2019-10-23 DIAGNOSIS — Z87891 Personal history of nicotine dependence: Secondary | ICD-10-CM

## 2019-10-23 DIAGNOSIS — N390 Urinary tract infection, site not specified: Secondary | ICD-10-CM | POA: Diagnosis present

## 2019-10-23 DIAGNOSIS — E7800 Pure hypercholesterolemia, unspecified: Secondary | ICD-10-CM | POA: Diagnosis present

## 2019-10-23 DIAGNOSIS — D6959 Other secondary thrombocytopenia: Secondary | ICD-10-CM | POA: Diagnosis present

## 2019-10-23 DIAGNOSIS — G931 Anoxic brain damage, not elsewhere classified: Secondary | ICD-10-CM | POA: Diagnosis present

## 2019-10-23 DIAGNOSIS — R531 Weakness: Secondary | ICD-10-CM | POA: Diagnosis present

## 2019-10-23 DIAGNOSIS — I4891 Unspecified atrial fibrillation: Secondary | ICD-10-CM | POA: Diagnosis present

## 2019-10-23 DIAGNOSIS — E870 Hyperosmolality and hypernatremia: Secondary | ICD-10-CM | POA: Diagnosis present

## 2019-10-23 DIAGNOSIS — A419 Sepsis, unspecified organism: Principal | ICD-10-CM | POA: Diagnosis present

## 2019-10-23 DIAGNOSIS — Z515 Encounter for palliative care: Secondary | ICD-10-CM | POA: Diagnosis present

## 2019-10-23 DIAGNOSIS — J9602 Acute respiratory failure with hypercapnia: Secondary | ICD-10-CM | POA: Diagnosis present

## 2019-10-23 DIAGNOSIS — I447 Left bundle-branch block, unspecified: Secondary | ICD-10-CM | POA: Diagnosis present

## 2019-10-23 DIAGNOSIS — D6481 Anemia due to antineoplastic chemotherapy: Secondary | ICD-10-CM | POA: Diagnosis present

## 2019-10-23 DIAGNOSIS — R6521 Severe sepsis with septic shock: Secondary | ICD-10-CM | POA: Diagnosis present

## 2019-10-23 DIAGNOSIS — T451X5A Adverse effect of antineoplastic and immunosuppressive drugs, initial encounter: Secondary | ICD-10-CM | POA: Diagnosis present

## 2019-10-23 DIAGNOSIS — Z794 Long term (current) use of insulin: Secondary | ICD-10-CM

## 2019-10-23 DIAGNOSIS — Z79899 Other long term (current) drug therapy: Secondary | ICD-10-CM

## 2019-10-23 DIAGNOSIS — Z4659 Encounter for fitting and adjustment of other gastrointestinal appliance and device: Secondary | ICD-10-CM

## 2019-10-23 LAB — BLOOD GAS, ARTERIAL
Acid-base deficit: 6.3 mmol/L — ABNORMAL HIGH (ref 0.0–2.0)
Bicarbonate: 22 mmol/L (ref 20.0–28.0)
FIO2: 1
MECHVT: 500 mL
O2 Saturation: 81.6 %
PEEP: 8 cmH2O
Patient temperature: 37
RATE: 16 resp/min
pCO2 arterial: 59 mmHg — ABNORMAL HIGH (ref 32.0–48.0)
pH, Arterial: 7.18 — CL (ref 7.350–7.450)
pO2, Arterial: 58 mmHg — ABNORMAL LOW (ref 83.0–108.0)

## 2019-10-23 LAB — URINALYSIS, ROUTINE W REFLEX MICROSCOPIC
Bacteria, UA: NONE SEEN
Bilirubin Urine: NEGATIVE
Glucose, UA: NEGATIVE mg/dL
Ketones, ur: NEGATIVE mg/dL
Nitrite: NEGATIVE
Protein, ur: 100 mg/dL — AB
RBC / HPF: >50 RBC/hpf — ABNORMAL HIGH (ref 0–5)
Specific Gravity, Urine: 1.016 (ref 1.005–1.030)
pH: 6 (ref 5.0–8.0)

## 2019-10-23 LAB — CBC
HCT: 28.1 % — ABNORMAL LOW (ref 39.0–52.0)
Hemoglobin: 8.5 g/dL — ABNORMAL LOW (ref 13.0–17.0)
MCH: 28.5 pg (ref 26.0–34.0)
MCHC: 30.2 g/dL (ref 30.0–36.0)
MCV: 94.3 fL (ref 80.0–100.0)
Platelets: 78 10*3/uL — ABNORMAL LOW (ref 150–400)
RBC: 2.98 MIL/uL — ABNORMAL LOW (ref 4.22–5.81)
RDW: 20.8 % — ABNORMAL HIGH (ref 11.5–15.5)
WBC: 11.5 10*3/uL — ABNORMAL HIGH (ref 4.0–10.5)
nRBC: 0.4 % — ABNORMAL HIGH (ref 0.0–0.2)

## 2019-10-23 LAB — RESPIRATORY PANEL BY RT PCR (FLU A&B, COVID)
Influenza A by PCR: NEGATIVE
Influenza B by PCR: NEGATIVE
SARS Coronavirus 2 by RT PCR: NEGATIVE

## 2019-10-23 LAB — RESPIRATORY PANEL BY PCR

## 2019-10-23 LAB — URINE DRUG SCREEN, QUALITATIVE (ARMC ONLY)
Amphetamines, Ur Screen: NOT DETECTED
Barbiturates, Ur Screen: NOT DETECTED
Benzodiazepine, Ur Scrn: NOT DETECTED
Cannabinoid 50 Ng, Ur ~~LOC~~: NOT DETECTED
Cocaine Metabolite,Ur ~~LOC~~: NOT DETECTED
MDMA (Ecstasy)Ur Screen: NOT DETECTED
Methadone Scn, Ur: NOT DETECTED
Opiate, Ur Screen: NOT DETECTED
Phencyclidine (PCP) Ur S: NOT DETECTED
Tricyclic, Ur Screen: NOT DETECTED

## 2019-10-23 LAB — COMPREHENSIVE METABOLIC PANEL
ALT: 24 U/L (ref 0–44)
AST: 25 U/L (ref 15–41)
Albumin: 3 g/dL — ABNORMAL LOW (ref 3.5–5.0)
Alkaline Phosphatase: 120 U/L (ref 38–126)
Anion gap: 11 (ref 5–15)
BUN: 31 mg/dL — ABNORMAL HIGH (ref 8–23)
CO2: 25 mmol/L (ref 22–32)
Calcium: 8.5 mg/dL — ABNORMAL LOW (ref 8.9–10.3)
Chloride: 115 mmol/L — ABNORMAL HIGH (ref 98–111)
Creatinine, Ser: 1.68 mg/dL — ABNORMAL HIGH (ref 0.61–1.24)
GFR calc Af Amer: 46 mL/min — ABNORMAL LOW (ref >60)
GFR calc non Af Amer: 40 mL/min — ABNORMAL LOW (ref >60)
Glucose, Bld: 148 mg/dL — ABNORMAL HIGH (ref 70–99)
Potassium: 4.3 mmol/L (ref 3.5–5.1)
Sodium: 151 mmol/L — ABNORMAL HIGH (ref 135–145)
Total Bilirubin: 0.7 mg/dL (ref 0.3–1.2)
Total Protein: 6.9 g/dL (ref 6.5–8.1)

## 2019-10-23 LAB — DIFFERENTIAL
Abs Immature Granulocytes: 0.03 10*3/uL (ref 0.00–0.07)
Basophils Absolute: 0 10*3/uL (ref 0.0–0.1)
Basophils Relative: 0 %
Eosinophils Absolute: 4.2 10*3/uL — ABNORMAL HIGH (ref 0.0–0.5)
Eosinophils Relative: 36 %
Immature Granulocytes: 0 %
Lymphocytes Relative: 19 %
Lymphs Abs: 2.1 10*3/uL (ref 0.7–4.0)
Monocytes Absolute: 0.8 10*3/uL (ref 0.1–1.0)
Monocytes Relative: 7 %
Neutro Abs: 4.3 10*3/uL (ref 1.7–7.7)
Neutrophils Relative %: 38 %
Smear Review: NORMAL

## 2019-10-23 LAB — LACTIC ACID, PLASMA
Lactic Acid, Venous: 3.2 mmol/L (ref 0.5–1.9)
Lactic Acid, Venous: 3.1 mmol/L (ref 0.5–1.9)

## 2019-10-23 LAB — PROTIME-INR
INR: 1.3 — ABNORMAL HIGH (ref 0.8–1.2)
Prothrombin Time: 15.5 seconds — ABNORMAL HIGH (ref 11.4–15.2)

## 2019-10-23 LAB — STREP PNEUMONIAE URINARY ANTIGEN: Strep Pneumo Urinary Antigen: NEGATIVE

## 2019-10-23 LAB — TROPONIN I (HIGH SENSITIVITY)
Troponin I (High Sensitivity): 9 ng/L (ref <18)
Troponin I (High Sensitivity): 18 ng/L — ABNORMAL HIGH (ref <18)

## 2019-10-23 LAB — ETHANOL: Alcohol, Ethyl (B): <10 mg/dL (ref <10)

## 2019-10-23 LAB — PROCALCITONIN: Procalcitonin: 0.14 ng/mL

## 2019-10-23 LAB — GLUCOSE, CAPILLARY: Glucose-Capillary: 130 mg/dL — ABNORMAL HIGH (ref 70–99)

## 2019-10-23 LAB — APTT: aPTT: 70 seconds — ABNORMAL HIGH (ref 24–36)

## 2019-10-23 MED ORDER — ASPIRIN EC 325 MG PO TBEC: 325.0000 mg | DELAYED_RELEASE_TABLET | Freq: Every day | ORAL | Status: DC

## 2019-10-23 MED ORDER — AMIODARONE IV BOLUS ONLY 150 MG/100ML
INTRAVENOUS | Status: AC
  Filled 2019-10-23: qty 100

## 2019-10-23 MED ORDER — MAGNESIUM SULFATE 2 GM/50ML IV SOLN: 2.0000 g | Freq: Once | INTRAVENOUS | Status: AC

## 2019-10-23 MED ORDER — FENTANYL CITRATE (PF) 100 MCG/2ML IJ SOLN
100.0000 ug | Freq: Once | INTRAMUSCULAR | Status: AC
  Administered 2019-10-23: 100 ug via INTRAVENOUS

## 2019-10-23 MED ORDER — EPINEPHRINE 1 MG/10ML IJ SOSY
1.0000 | PREFILLED_SYRINGE | Freq: Once | INTRAMUSCULAR | Status: AC
  Administered 2019-10-23: 1 mg via INTRAVENOUS

## 2019-10-23 MED ORDER — VANCOMYCIN HCL 1500 MG/300ML IV SOLN
1500.0000 mg | Freq: Once | INTRAVENOUS | Status: AC
  Administered 2019-10-23: 1500 mg via INTRAVENOUS
  Filled 2019-10-23: qty 300

## 2019-10-23 MED ORDER — HEPARIN SODIUM (PORCINE) 5000 UNIT/ML IJ SOLN: 5000.0000 [IU] | Freq: Three times a day (TID) | INTRAMUSCULAR | Status: DC

## 2019-10-23 MED ORDER — NOREPINEPHRINE 4 MG/250ML-% IV SOLN
INTRAVENOUS | Status: AC
  Administered 2019-10-23: 10 ug/min via INTRAVENOUS
  Filled 2019-10-23: qty 250

## 2019-10-23 MED ORDER — ASPIRIN 300 MG RE SUPP
300.0000 mg | Freq: Once | RECTAL | Status: AC
  Administered 2019-10-23: 300 mg via RECTAL
  Filled 2019-10-23: qty 1

## 2019-10-23 MED ORDER — VANCOMYCIN HCL 1250 MG/250ML IV SOLN
1250.0000 mg | INTRAVENOUS | Status: DC
  Filled 2019-10-23: qty 250

## 2019-10-23 MED ORDER — EPINEPHRINE 1 MG/10ML IJ SOSY
PREFILLED_SYRINGE | INTRAMUSCULAR | Status: AC | PRN
  Administered 2019-10-23 (×4): 1 via INTRAVENOUS

## 2019-10-23 MED ORDER — LACTATED RINGERS IV BOLUS
1000.0000 mL | Freq: Once | INTRAVENOUS | Status: AC
  Administered 2019-10-23: 1000 mL via INTRAVENOUS

## 2019-10-23 MED ORDER — DEXTROSE 5 % IV SOLN
INTRAVENOUS | Status: AC | PRN
  Administered 2019-10-23: 150 mg via INTRAVENOUS

## 2019-10-23 MED ORDER — AMIODARONE HCL IN DEXTROSE 360-4.14 MG/200ML-% IV SOLN
30.0000 mg/h | INTRAVENOUS | Status: DC
  Administered 2019-10-23: 30 mg/h via INTRAVENOUS

## 2019-10-23 MED ORDER — POLYETHYLENE GLYCOL 3350 17 G PO PACK: 17.0000 g | PACK | Freq: Every day | ORAL | Status: DC | PRN

## 2019-10-23 MED ORDER — FAMOTIDINE IN NACL 20-0.9 MG/50ML-% IV SOLN
20.0000 mg | Freq: Two times a day (BID) | INTRAVENOUS | Status: DC
  Administered 2019-10-23: 20 mg via INTRAVENOUS
  Filled 2019-10-23: qty 50

## 2019-10-23 MED ORDER — DEXMEDETOMIDINE HCL IN NACL 400 MCG/100ML IV SOLN
0.4000 ug/kg/h | INTRAVENOUS | Status: DC
  Administered 2019-10-23: 0.4 ug/kg/h via INTRAVENOUS
  Administered 2019-10-23: 1.2 ug/kg/h via INTRAVENOUS
  Filled 2019-10-23 (×3): qty 100

## 2019-10-23 MED ORDER — SODIUM CHLORIDE 0.9 % IV SOLN: 2.0000 g | Freq: Two times a day (BID) | INTRAVENOUS | Status: DC

## 2019-10-23 MED ORDER — IPRATROPIUM-ALBUTEROL 0.5-2.5 (3) MG/3ML IN SOLN: 3.0000 mL | RESPIRATORY_TRACT | Status: DC | PRN

## 2019-10-23 MED ORDER — MAGNESIUM SULFATE 2 GM/50ML IV SOLN
INTRAVENOUS | Status: AC
  Administered 2019-10-23: 2 g via INTRAVENOUS
  Filled 2019-10-23: qty 50

## 2019-10-23 MED ORDER — SODIUM CHLORIDE 0.9 % IV SOLN
1.0000 g | Freq: Once | INTRAVENOUS | Status: AC
  Administered 2019-10-23: 1 g via INTRAVENOUS
  Filled 2019-10-23: qty 1

## 2019-10-23 MED ORDER — LEVETIRACETAM IN NACL 1000 MG/100ML IV SOLN
1000.0000 mg | Freq: Once | INTRAVENOUS | Status: AC
  Administered 2019-10-23: 1000 mg via INTRAVENOUS
  Filled 2019-10-23: qty 100

## 2019-10-23 MED ORDER — NOREPINEPHRINE 4 MG/250ML-% IV SOLN
0.0000 ug/min | INTRAVENOUS | Status: DC
  Administered 2019-10-23: 10 ug/min via INTRAVENOUS
  Filled 2019-10-23: qty 250

## 2019-10-23 MED ORDER — SUCCINYLCHOLINE CHLORIDE 20 MG/ML IJ SOLN
200.0000 mg | Freq: Once | INTRAMUSCULAR | Status: AC
  Administered 2019-10-23: 200 mg via INTRAVENOUS

## 2019-10-23 MED ORDER — ETOMIDATE 2 MG/ML IV SOLN
20.0000 mg | Freq: Once | INTRAVENOUS | Status: AC
  Administered 2019-10-23: 20 mg via INTRAVENOUS

## 2019-10-23 MED ORDER — AMIODARONE HCL IN DEXTROSE 360-4.14 MG/200ML-% IV SOLN
60.0000 mg/h | INTRAVENOUS | Status: AC
  Administered 2019-10-23 (×2): 60 mg/h via INTRAVENOUS
  Filled 2019-10-23: qty 200

## 2019-10-23 MED ORDER — LORAZEPAM 2 MG/ML IJ SOLN
1.0000 mg | Freq: Once | INTRAMUSCULAR | Status: DC
  Filled 2019-10-23: qty 1

## 2019-10-23 MED ORDER — DOCUSATE SODIUM 100 MG PO CAPS: 100.0000 mg | ORAL_CAPSULE | Freq: Two times a day (BID) | ORAL | Status: DC | PRN

## 2019-10-23 MED ORDER — VANCOMYCIN HCL IN DEXTROSE 1-5 GM/200ML-% IV SOLN
1000.0000 mg | Freq: Once | INTRAVENOUS | Status: AC
  Administered 2019-10-23: 1000 mg via INTRAVENOUS
  Filled 2019-10-23: qty 200

## 2019-10-23 MED FILL — Medication: Qty: 1 | Status: AC

## 2019-10-24 LAB — URINE CULTURE: Culture: NO GROWTH

## 2019-10-25 LAB — CULTURE, RESPIRATORY W GRAM STAIN: Culture: NORMAL

## 2019-10-25 LAB — LEGIONELLA PNEUMOPHILA SEROGP 1 UR AG: L. pneumophila Serogp 1 Ur Ag: NEGATIVE

## 2019-10-28 LAB — CULTURE, BLOOD (ROUTINE X 2)
Culture: NO GROWTH
Culture: NO GROWTH
Special Requests: ADEQUATE
Special Requests: ADEQUATE

## 2019-10-30 NOTE — ED Notes (Addendum)
Family at bedside. Family wishes for pt to be full code and not DNR. Informed Agricultural consultant of wishes. Admit MD messaged and informed.

## 2019-10-30 NOTE — ED Notes (Signed)
NSR note on monitor HR 63

## 2019-10-30 NOTE — ED Notes (Signed)
Pt became bradycardic and pulseless, ACLS started EDP at bedside.

## 2019-10-30 NOTE — ED Notes (Signed)
VORB from EDP for 20mg  etomidate and 200mg  succinylcholine given at this time.

## 2019-10-30 NOTE — ED Notes (Signed)
CXR at bedside, tele neuro consult in progress at bedside with Dr. Alfred Levins present.

## 2019-10-30 NOTE — Progress Notes (Signed)
Ch visited with Pt's brothers, sister and nieces in response to call from RN about Pt's passing. Ch arrived and provided pastoral presence and support. MD read a scripture before leaving. Family requested prayer. Ch prayed with them. Family shared with Ch about Pt walking around yesterday. Ch provided grief support to family.

## 2019-10-30 NOTE — ED Notes (Signed)
Chaplain at bedside with family 

## 2019-10-30 NOTE — Plan of Care (Addendum)
Met with patient's family, 2 brothers, sister and 2 nieces. Discussed the severity of the situation with them. Currently patient is full scope of care but DNR in the event of another cardiac arrest. Family was contemplating reverting to full code. After our discussion and after input from the 2 nieces it was decided to continue full scope of care but DNR in the event of another cardiac arrest. We discussed the likely scenario of issues that led to PEA arrest x3. Family understands the severity of the situation and his poor prognosis overall.  Chaplain Lindaann Pascal was present during the family update.  Renold Don, MD [facility]   *This note was dictated using voice recognition software/Dragon.  Despite best efforts to proofread, errors can occur which can change the meaning.  Any change was purely unintentional.

## 2019-10-30 NOTE — Consult Note (Addendum)
[facility] TeleNeurology Consult Services   Date of Service:   11/21/2019 03:31:23  Impression:       I63.00 - Cerebrovascular accident (CVA) due to thrombosis of precerebral artery ([place])  Comments/Sign-Out: 74 yr old man, hx of DM, HTN, lipids, recent septic shock, comes from nursing home with L hemiparesis, arm worse than leg, unresponsiveness, R gaze preference, cardiac arrest - post CPR and intubation. Last seen at baseline > 5 hours ago.  CT head no acute changes. NIH 25- for global findings as patient was intubated, paralyzed by the time of my exam, but per report and ER notes, he had L arm plegia, L face droop, L leg drift, R gaze preference.   Diff Dx: CVA, seizure, sepsis, other, Out of alteplase window and has coagulopathy, plts 78 k, PT > 15, PTT 70.   Rec: - ER is trying to stabilize him hemodynamically, he had another cardiac arrest. - keppra 1 g IV loading dose with possibility this was seizure. - CTA brain , neck, CT perfusion, r/o LVO, - MRI brain - ASA - Neurology follow up.   d/w ER Dr Alfred Levins all of the above recs, we agree that patient will need to be stabilized prior to getting CTA, CT Perfusion brain.  Metrics: Last Known Well: 10/22/2019 22:30:00 [facility] Notification Time: 11-21-2019 03:31:23 Arrival Time: Nov 21, 2019 02:27:00 Stamp Time: 11-21-2019 03:31:23 Time First Login Attempt: 11-21-2019 03:34:28 Symptoms: L weakness. NIHSS Start Assessment Time: 2019/11/21 03:35:12 Patient is not a candidate for Thrombolytic. Thrombolytic Medical Decision: 2019-11-21 03:36:37 Patient was not deemed candidate for Thrombolytic because of following reasons: Last Well Known Above 4.5 Hours. Active Coagulopathy.  CT head showed no acute hemorrhage or acute core infarct.  ED Physician notified of diagnostic impression and management plan on 11-21-19 04:09:00  Advanced Imaging: CTA Head and Neck ordered  CTP ordered   Our  recommendations are outlined below.  Recommendations:       Activate Stroke Protocol Admission/Order Set       Stroke/Telemetry Floor       Neuro Checks       Bedside Swallow Eval       DVT Prophylaxis       IV Fluids, Normal Saline       Head of Bed 30 Degrees       Euglycemia and Avoid Hyperthermia (PRN Acetaminophen)       Antiplatelet Therapy Recommended       Rec:       - ER is trying to stabilize him hemodynamically, he had another cardiac arrest.       - keppra 1 g IV loading dose with possibility this was seizure.       - CTA brain , neck, CT perfusion, r/o LVO,       - MRI brain       - ASA       - Neurology follow up.       d/w ER Dr Alfred Levins all of the above recs, we agree that patient will need to be stabilized prior to getting CTA, CT Perfusion brain.  Routine Consultation with [facility] for Follow up Care  Sign Out:       Discussed with Emergency Department Provider    ------------------------------------------------------------------------------  History of Present Illness: Patient is a 74 year old Male.  Patient was brought by EMS for symptoms of L weakness.  74 yr old man, hx of DM, HTN, lipids, recent septic shock, comes from nursing home, hx obtained  and verified with nursing home. He was seen at baseline around 10:30 on 9/23. Some time after that, he was noted to have unresponsiveness, L sided weakness, R gaze preference, confusion and brought to ER. Stroke alert called, and while in radiology, he had bradycardia, asystole, and needed CPR and intubated. As I was obtaining the history from the ER team at bedside, patient became bradycardic again, and CPR was started since he was pulseless.   Past Medical History:      Hypertension      Diabetes Mellitus      Hyperlipidemia  Social History: Smoking: No Alcohol Use: No Drug Use: No  Family History:Unable to obtain due to Patient Status  Review of System:  14 Points  Review of Systems was performed and was negative except mentioned in HPI.  Anticoagulant use:  No  Antiplatelet use: No  NIHSS may not be reliable due to: Patient was intubated and paralyzed  Examination: BP(100/54), Pulse(54), Blood Glucose(148) 1A: Level of Consciousness - Movements to Pain + 2 1B: Ask Month and Age - Dysarthric/Intubated/ Trauma/Language Barrier + 1 1C: Blink Eyes & Squeeze Hands - Performs 0 Tasks + 2 2: Test Horizontal Extraocular Movements - Partial Gaze Palsy: Can Be Overcome + 1 3: Test Visual Fields - No Visual Loss + 0 4: Test Facial Palsy (Use Grimace if Obtunded) - Minor paralysis (flat nasolabial fold, smile asymmetry) + 1 5A: Test Left Arm Motor Drift - No Movement + 4 5B: Test Right Arm Motor Drift - No Effort Against Gravity + 3 6A: Test Left Leg Motor Drift - No Effort Against Gravity + 3 6B: Test Right Leg Motor Drift - No Effort Against Gravity + 3 7: Test Limb Ataxia (FNF/Heel-Shin) - Does Not Understand + 0 8: Test Sensation - Coma/Unresponsive + 2 9: Test Language/Aphasia - Coma/Unresponsive + 3 10: Test Dysarthria - Intubated/Unable to Test + 0 11: Test Extinction/Inattention - No abnormality + 0  NIHSS Score: 25  Pre-Morbid Modified Rankin Scale: Unable to assess   Patient/Family was informed the Neurology Consult would occur via TeleHealth consult by way of interactive audio and video telecommunications and consented to receiving care in this manner.   Patient is being evaluated for possible acute neurologic impairment and high probability of imminent or life-threatening deterioration. I spent total of 25 minutes providing care to this patient, including time for face to face visit via telemedicine, review of medical records, imaging studies and discussion of findings with providers, the patient and/or family.   Dr Lloyd Huger   [facility] [phone]  Case [id number]  CTA brain, neck and CT perfusion ordered earlier,  about 2.5 hours ago,  but at 6:15 am, I called Dr Alfred Levins in ER and discussed case with her. Patient is still pretty unstable hemodynamically, and not felt to be safe for CTA and CTP testing. Pt has had multiple episodes of cardiac arrest.  Dr Alfred Levins will kindly discontinue order for now, and ICU team will re order CTA and CTP when he is more stable later today. Advised ER to please call us when testing done as he will also need Neurology follow up. If he is not able to get CTA/CTP this morning, then I would recommend repeating ct head, non contrast.  Above recs d/w ER Dr Alfred Levins. Please call us for follow up as needed.

## 2019-10-30 NOTE — ED Notes (Addendum)
Pt noted to have Afib on the monitor HR 76 with faint pulses present. Retail banker in place

## 2019-10-30 NOTE — Consult Note (Signed)
Reason for Consult: cardiac arrest  Requesting Physician: DR. Duwayne Heck   CC: cardiac arrest    HPI: Cory Lynch is an 74 y.o. male with hx of DM, HTN, mycosis being treated at [facility]. Patient presented overnight with stroke like symptoms followed by cardiac arrest while he was in [facility]. Currently intubated, not following commands, on pressors and amiodarone gtt.   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[place])     Past Surgical History:  Procedure Laterality Date   CYSTOSCOPY WITH LITHOLAPAXY N/A 09/17/2016   Procedure: CYSTOSCOPY WITH LITHOLAPAXY;  Surgeon: Cleon Gustin, MD;  Location: [facility];  Service: Urology;  Laterality: N/A;  30 MINS [phone] [id number]   HOLMIUM LASER APPLICATION N/A 5/36/1443   Procedure: HOLMIUM LASER APPLICATION;  Surgeon: Cleon Gustin, MD;  Location: [facility];  Service: Urology;  Laterality: N/A;    Family History  Problem Relation Age of Onset   Cancer Brother     Social History:  reports that he quit smoking about 6 years ago. His smoking use included cigarettes. He smoked 0.25 packs per day. He has never used smokeless tobacco. He reports that he does not drink alcohol and does not use drugs.  No Known Allergies  Medications: Prior to Admission: (Not in a hospital admission)   ROS: Unable to obtain   Physical Examination: Blood pressure (!) 117/58, pulse (!) 41, temperature (!) 91 F (32.8 C), resp. rate (!) 22, height 5' 4.02" (1.626 m), weight 111.1 kg, SpO2 97 %.  Pt is intubated Not following commands Does not move his extremities or withdraw from pain Full vent support  Laboratory Studies:   Basic Metabolic Panel: Recent Labs  Lab 10-26-2019 0232  NA 151*  K 4.3  CL 115*  CO2 25  GLUCOSE 148*  BUN 31*  CREATININE 1.68*  CALCIUM 8.5*    Liver Function  Tests: Recent Labs  Lab 10-26-2019 0232  AST 25  ALT 24  ALKPHOS 120  BILITOT 0.7  PROT 6.9  ALBUMIN 3.0*   No results for input(s): LIPASE, AMYLASE in the last 168 hours. No results for input(s): AMMONIA in the last 168 hours.  CBC: Recent Labs  Lab 2019-10-26 0232  WBC 11.5*  NEUTROABS 4.3  HGB 8.5*  HCT 28.1*  MCV 94.3  PLT 78*    Cardiac Enzymes: No results for input(s): CKTOTAL, CKMB, CKMBINDEX, TROPONINI in the last 168 hours.  BNP: Invalid input(s): POCBNP  CBG: Recent Labs  Lab October 26, 2019 0230  GLUCAP 130*    Microbiology: Results for orders placed or performed during the hospital encounter of October 26, 2019  Respiratory Panel by RT PCR (Flu A&B, Covid) - Nasopharyngeal Swab     Status: None   Collection Time: 10-26-2019  3:25 AM   Specimen: Nasopharyngeal Swab  Result Value Ref Range Status   SARS Coronavirus 2 by RT PCR NEGATIVE NEGATIVE Final    Comment: (NOTE) SARS-CoV-2 target nucleic acids are NOT DETECTED.  The SARS-CoV-2 RNA is generally detectable in upper respiratoy specimens during the acute phase of infection. The lowest concentration of SARS-CoV-2 viral copies this assay can detect is 131 copies/mL. A negative result does not preclude SARS-Cov-2 infection and should not be used as the sole basis for treatment or other patient management decisions. A negative result may occur with  improper specimen collection/handling, submission of specimen other  than nasopharyngeal swab, presence of viral mutation(s) within the areas targeted by this assay, and inadequate number of viral copies (<131 copies/mL). A negative result must be combined with clinical observations, patient history, and epidemiological information. The expected result is Negative.  Fact Sheet for Patients:  PinkCheek.be  Fact Sheet for Healthcare Providers:  GravelBags.it  This test is no t yet approved or cleared by the  [place] FDA and  has been authorized for detection and/or diagnosis of SARS-CoV-2 by FDA under an Emergency Use Authorization (EUA). This EUA will remain  in effect (meaning this test can be used) for the duration of the COVID-19 declaration under Section 564(b)(1) of the Act, 21 U.S.C. section 360bbb-3(b)(1), unless the authorization is terminated or revoked sooner.     Influenza A by PCR NEGATIVE NEGATIVE Final   Influenza B by PCR NEGATIVE NEGATIVE Final    Comment: (NOTE) The Xpert Xpress SARS-CoV-2/FLU/RSV assay is intended as an aid in  the diagnosis of influenza from Nasopharyngeal swab specimens and  should not be used as a sole basis for treatment. Nasal washings and  aspirates are unacceptable for Xpert Xpress SARS-CoV-2/FLU/RSV  testing.  Fact Sheet for Patients: PinkCheek.be  Fact Sheet for Healthcare Providers: GravelBags.it  This test is not yet approved or cleared by the [place] FDA and  has been authorized for detection and/or diagnosis of SARS-CoV-2 by  FDA under an Emergency Use Authorization (EUA). This EUA will remain  in effect (meaning this test can be used) for the duration of the  Covid-19 declaration under Section 564(b)(1) of the Act, 21  U.S.C. section 360bbb-3(b)(1), unless the authorization is  terminated or revoked. Performed at [facility], [address] [place], [place] [address]   Blood culture (routine x 2)     Status: None (Preliminary result)   Collection Time: Nov 02, 2019  4:37 AM   Specimen: BLOOD  Result Value Ref Range Status   Specimen Description BLOOD RIGHT ARM  Final   Special Requests   Final    BOTTLES DRAWN AEROBIC AND ANAEROBIC Blood Culture adequate volume   Culture   Final    NO GROWTH < 12 HOURS Performed at [facility], [address], [place], [place] [address]    Report Status PENDING  Incomplete  Blood culture (routine x 2)      Status: None (Preliminary result)   Collection Time: November 02, 2019  4:37 AM   Specimen: BLOOD  Result Value Ref Range Status   Specimen Description BLOOD RIGHT [facility]  Final   Special Requests   Final    BOTTLES DRAWN AEROBIC AND ANAEROBIC Blood Culture adequate volume   Culture   Final    NO GROWTH < 12 HOURS Performed at [facility], [address] [place], [place] [address]    Report Status PENDING  Incomplete    Coagulation Studies: Recent Labs    2019-11-02 0232  LABPROT 15.5*  INR 1.3*    Urinalysis:  Recent Labs  Lab 2019-11-02 0325  COLORURINE YELLOW*  LABSPEC 1.016  PHURINE 6.0  GLUCOSEU NEGATIVE  HGBUR MODERATE*  BILIRUBINUR NEGATIVE  KETONESUR NEGATIVE  PROTEINUR 100*  NITRITE NEGATIVE  LEUKOCYTESUR MODERATE*    Lipid Panel:  No results found for: CHOL, TRIG, HDL, CHOLHDL, VLDL, LDLCALC  HgbA1C:  Lab Results  Component Value Date   HGBA1C 6.9 (H) 10/06/2019    Urine Drug Screen:      Component Value Date/Time   LABOPIA NONE DETECTED 11/02/2019 0316  COCAINSCRNUR NONE DETECTED 2019-11-20 0316   LABBENZ NONE DETECTED Nov 20, 2019 0316   AMPHETMU NONE DETECTED 11-20-2019 0316   THCU NONE DETECTED 20-Nov-2019 0316   LABBARB NONE DETECTED 2019-11-20 0316    Alcohol Level:  Recent Labs  Lab Nov 20, 2019 0232  ETH <10    Imaging: DG Chest 1 View  Result Date: 20-Nov-2019 CLINICAL DATA:  Intubation EXAM: CHEST  1 VIEW COMPARISON:  10/07/2019 FINDINGS: Endotracheal tube is seen 14 mm above the carina. Nasogastric tube extends into the upper abdomen beyond the margin of the examination. The lungs are symmetrically well expanded. Extensive bilateral airspace infiltrate has developed, new since prior examination, infection versus edema. No pneumothorax or pleural effusion. Cardiac size is within normal limits. No acute bone abnormality. IMPRESSION: Support tubes as described above. Interval development of extensive airspace infiltrate, infection versus edema.  Electronically Signed   By: Fidela Salisbury MD   On: Nov 20, 2019 03:59   DG Abd 1 View  Result Date: 11-20-2019 CLINICAL DATA:  Nasogastric tube placement EXAM: ABDOMEN - 1 VIEW COMPARISON:  None. FINDINGS: Nasogastric tube extends into the distal body of the stomach. Visualized abdominal gas pattern is unremarkable. Extensive infiltrate is seen asymmetrically at the lung bases, left greater than right. IMPRESSION: Nasogastric tube tip within the distal body of the stomach. Electronically Signed   By: Fidela Salisbury MD   On: 11/20/19 04:00   CT HEAD CODE STROKE WO CONTRAST  Result Date: 2019/11/20 CLINICAL DATA:  Code stroke. Initial evaluation for acute left-sided weakness. EXAM: CT HEAD WITHOUT CONTRAST TECHNIQUE: Contiguous axial images were obtained from the base of the skull through the vertex without intravenous contrast. COMPARISON:  None available. FINDINGS: Brain: Cerebral volume within normal limits for patient age. Minimal scattered patchy hypodensity within the supratentorial cerebral white matter, most like related chronic microvascular ischemic disease, mild in nature. No evidence for acute intracranial hemorrhage. No findings to suggest acute large vessel territory infarct. No mass lesion, midline shift, or mass effect. Ventricles are normal in size without evidence for hydrocephalus. No extra-axial fluid collection identified. Vascular: No hyperdense vessel identified. Skull: Scalp soft tissues demonstrate no acute abnormality. Calvarium intact. Sinuses/Orbits: Globes and orbital soft tissues demonstrate no acute finding. Remote fracture of the right lamina papyracea noted. Visualized paranasal sinuses are clear. No mastoid effusion. ASPECTS [facility] Stroke Program Early CT Score) - Ganglionic level infarction (caudate, lentiform nuclei, internal capsule, insula, M1-M3 cortex): 7 - Supraganglionic infarction (M4-M6 cortex): 3 Total score (0-10 with 10 being normal): 10 IMPRESSION: 1. No acute  intracranial infarct or other abnormality. 2. ASPECTS is 10. Critical Value/emergent results were called by telephone at the time of interpretation on 11/20/19 at 3:14 am to provider [facility] , who verbally acknowledged these results. Electronically Signed   By: Jeannine Boga M.D.   On: Nov 20, 2019 03:15     Assessment/Plan:   74 y.o. male with hx of DM, HTN, mycosis being treated at [facility]. Patient presented overnight with stroke like symptoms followed by cardiac arrest while he was in [facility]. Currently intubated, not following commands, on pressors and amiodarone gtt.   - Cardiac arrest s/p PEA arrest - on levophed and amiodarone - s/p discussion with family who want to continue Full Code - CTH initial no acute abnormality  - Will need EEG and again repeat imaging - Guarded prognosis at this time 11/20/2019, 9:23 AM

## 2019-10-30 NOTE — ED Notes (Signed)
Pt pulseless at this time, Dr. Alfred Levins at bedside with [place].

## 2019-10-30 NOTE — ED Notes (Signed)
Report received from [facility]. Patient care assumed. Patient/RN introduction complete. Will continue to monitor.

## 2019-10-30 NOTE — ED Notes (Addendum)
PEA noted on monitor, ACLS continued.

## 2019-10-30 NOTE — ED Notes (Signed)
Report given to Samantha RN

## 2019-10-30 NOTE — Progress Notes (Signed)
[place] paged for CODE STROKE; upon arrival at rm., pt. in CT; per medical staff pt. anxious/agitated in CT --> [place] went to CT control rm. in case need for support arises; code blue called while pt. in CT scan; pt. regained rhythm and was intubated, brought back to ED rm.  No family present; Geraldine remains available as needed.

## 2019-10-30 NOTE — ED Notes (Signed)
EKG repeated

## 2019-10-30 NOTE — ED Notes (Signed)
Pt intubated by Dr. Owens Shark, tube secured and while stable finished CT scan and then returned to room 1 in the ED.

## 2019-10-30 NOTE — Progress Notes (Signed)
[place] paged to ED to support pt.'s family who just arrived from [place]; when [facility] arrived at rm., pt.'s two brothers and sister and two nieces gathered around bed; family subdued, grieving pt.'s condition, awaiting news from doctor; at length, [place] prayed w/family for pt.'s sense of divine presence and for wisdom for family as they make medical decisions re: his care.  Dr. Duwayne Heck arrived shortly after; met w/family in family consult rm. and explained pt.'s condition, treatments, prognosis; family agrees to DNR while continuing current treatments; Dr. Duwayne Heck brought family back to rm. --> no further shared needs at this time.  Chaplain team made aware of situation in case further support is needed.

## 2019-10-30 NOTE — ED Notes (Addendum)
Admit MD to sign DC at this time [facility] aware. AC to call CDS.

## 2019-10-30 NOTE — ED Provider Notes (Signed)
[facility] Emergency Department Provider Note  ____________________________________________  Time seen: Approximately 4:42 AM  I have reviewed the triage vital signs and the nursing notes.   HISTORY  Chief Complaint Weakness  Level 5 caveat:  Portions of the history and physical were unable to be obtained due to AMS   HPI Cory Lynch is a 74 y.o. 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  Last seen normal his nursing home at 10:30 PM.  When nursing staff came to check on him in the middle of the night found patient unresponsive, with right gaze deviation and not moving his left side.  EMS attempted to take patient to their local ED in [place] but they were told that the hospital was on diversion.  EMS then called our hospital asking permission to bring patient over here which we authorized.  Upon arrival patient had a pulse, was breathing, his eyes were open but he was not following commands, not answering questions, left side neglect with flaccid paralysis of the left upper extremity.   Past Medical History:  Diagnosis Date  . Diabetes mellitus without complication ([place])   . Hypercholesteremia   . Hypertension   . Hypothyroid   . Mycosis fungoides [facility])     Patient Active Problem List   Diagnosis Date Noted  . Cardiac arrest ([place]) 11-03-2019  . Hypothermia 10/06/2019  . Leukocytosis 10/06/2019  . Hypovolemic shock ([place]) 10/06/2019  . Mycosis fungoides ([place])   . Hypertension   . Hypercholesteremia   . Diabetes mellitus without complication ([place])   . Hypothyroid   . Anemia, unspecified   . Lactic acidosis   . Hypertriglyceridemia   . Drug-induced cytopenia   . Hyperglycemia   . Thrombocytopenia ([place])   . Peripheral neuropathy   . Insomnia   . Hypotension 10/05/2019  . 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 CYSTOSCOPY WITH LITHOLAPAXY N/A 09/17/2016   Procedure: CYSTOSCOPY WITH LITHOLAPAXY;  Surgeon: Cleon Gustin, MD;  Location: [facility];  Service: Urology;  Laterality: N/A;  30 MINS [id number]  . HOLMIUM LASER APPLICATION N/A 2/44/0102   Procedure: HOLMIUM LASER APPLICATION;  Surgeon: Cleon Gustin, MD;  Location: [facility];  Service: Urology;  Laterality: N/A;    Prior to Admission medications   Medication Sig Start Date End Date Taking? Authorizing Provider  acetaminophen (TYLENOL) 325 MG tablet Take 2 tablets (650 mg total) by mouth every 6 (six) hours as needed for mild pain (or Fever >/= 101). 10/09/19   Roxan Hockey, MD  aspirin EC 81 MG tablet Take 1 tablet (81 mg total) by mouth daily with breakfast. Swallow whole. 10/09/19   Roxan Hockey, MD  cetirizine (ZYRTEC) 10 MG tablet Take 1 tablet by mouth daily. 05/14/19 05/13/20  [provider]  furosemide (LASIX) 20 MG tablet Take 1 tablet (20 mg total) by mouth daily. 10/09/19   Roxan Hockey, MD  gabapentin (NEURONTIN) 600 MG tablet Take 600 mg by mouth 3 (three) times daily. 09/21/19   [provider]  HYDROcodone-acetaminophen (NORCO/VICODIN) 5-325 MG tablet Take 1 tablet by mouth 2 (two) times daily as needed. 10/09/19   Emokpae, Courage, MD  insulin aspart (NOVOLOG) 100 UNIT/ML FlexPen Inject 0-10 Units into the skin 3 (three) times daily with meals. insulin aspart (novoLOG) injection 0-10 Units 0-10 Units Subcutaneous, 3 times  daily with meals CBG < 70: Implement Hypoglycemia Standing Orders and refer to Hypoglycemia Standing Orders sidebar report  CBG 70 - 120: 0 unit CBG 121 - 150: 0 unit  CBG 151 - 200: 1 unit CBG 201 - 250: 2 units CBG 251 - 300: 4 units CBG 301 - 350: 6 units  CBG 351 - 400: 8 units  CBG > 400: 10 units 10/09/19   Emokpae, Courage, MD  levothyroxine (SYNTHROID) 125 MCG tablet Take 125 mcg by mouth  daily. 08/20/19   [provider]  Omega-3 Fatty Acids (FISH OIL) 1000 MG CAPS Take 1 capsule by mouth daily.    [provider]  pantoprazole (PROTONIX) 40 MG tablet Take 1 tablet (40 mg total) by mouth daily at 6 (six) AM. 10/09/19   Denton Brick, Courage, MD  tamsulosin (FLOMAX) 0.4 MG CAPS capsule Take 1 capsule (0.4 mg total) by mouth daily after supper. 10/09/19   Roxan Hockey, MD  zolpidem (AMBIEN) 5 MG tablet Take 5 mg by mouth at bedtime as needed. 09/08/19   [provider]    Allergies Patient has no known allergies.  Family History  Problem Relation Age of Onset  . Cancer Cory Lynch     Social History Social History   Tobacco Use  . Smoking status: Former Smoker    Packs/day: 0.25    Types: Cigarettes    Quit date: 09/14/2013    Years since quitting: 6.1  . Smokeless tobacco: Never Used  Vaping Use  . Vaping Use: Never used  Substance Use Topics  . Alcohol use: No  . Drug use: No    Review of Systems  Constitutional: Negative for fever. Neurological: + unresponsive  ____________________________________________   PHYSICAL EXAM:  VITAL SIGNS: ED Triage Vitals  Enc Vitals Group     BP October 26, 2019 0233 (!) 115/51     Pulse Rate Oct 26, 2019 0231 67     Resp 2019-10-26 0231 20     Temp 10-26-19 0330 (!) 87.8 F (31 C)     Temp Source Oct 26, 2019 0330 Bladder     SpO2 10/26/2019 0231 92 %     Weight --      Height Oct 26, 2019 0300 5' 4.02" (1.626 m)     Head Circumference --      Peak Flow --      Pain Score --      Pain Loc --      Pain Edu? --      Excl. in [place]? --     Constitutional: Eyes opened, unresponsive. HEENT:      Head: Normocephalic and atraumatic.         Eyes: Conjunctivae are normal. Sclera is non-icteric.       Mouth/Throat: Mucous membranes are dry.       Neck: Supple with no signs of meningismus. Cardiovascular: Regular rate and rhythm. No murmurs, gallops, or rubs. Respiratory: Increased work of breathing, hypoxic with  frank crackles bilaterally Gastrointestinal: Soft and non distended Musculoskeletal: No edema, cyanosis, or erythema of extremities. Neurologic: Non verbal, R gaze deviation, PERRL, flaccid paralysis of the LUE, moving the LLE, not following commands, L sided neglect Skin: Skin is warm, dry and intact. No rash noted.   ____________________________________________   LABS (all labs ordered are listed, but only abnormal results are displayed)  Labs Reviewed  PROTIME-INR - Abnormal; Notable for the following components:      Result Value   Prothrombin Time 15.5 (*)    INR 1.3 (*)  All other components within normal limits  APTT - Abnormal; Notable for the following components:   aPTT 70 (*)    All other components within normal limits  CBC - Abnormal; Notable for the following components:   WBC 11.5 (*)    RBC 2.98 (*)    Hemoglobin 8.5 (*)    HCT 28.1 (*)    RDW 20.8 (*)    Platelets 78 (*)    nRBC 0.4 (*)    All other components within normal limits  DIFFERENTIAL - Abnormal; Notable for the following components:   Eosinophils Absolute 4.2 (*)    All other components within normal limits  COMPREHENSIVE METABOLIC PANEL - Abnormal; Notable for the following components:   Sodium 151 (*)    Chloride 115 (*)    Glucose, Bld 148 (*)    BUN 31 (*)    Creatinine, Ser 1.68 (*)    Calcium 8.5 (*)    Albumin 3.0 (*)    GFR calc non Af Amer 40 (*)    GFR calc Af Amer 46 (*)    All other components within normal limits  URINALYSIS, ROUTINE W REFLEX MICROSCOPIC - Abnormal; Notable for the following components:   Color, Urine YELLOW (*)    APPearance CLOUDY (*)    Hgb urine dipstick MODERATE (*)    Protein, ur 100 (*)    Leukocytes,Ua MODERATE (*)    RBC / HPF >50 (*)    All other components within normal limits  GLUCOSE, CAPILLARY - Abnormal; Notable for the following components:   Glucose-Capillary 130 (*)    All other components within normal limits  BLOOD GAS, ARTERIAL -  Abnormal; Notable for the following components:   pH, Arterial 7.18 (*)    pCO2 arterial 59 (*)    pO2, Arterial 58 (*)    Acid-base deficit 6.3 (*)    All other components within normal limits  LACTIC ACID, PLASMA - Abnormal; Notable for the following components:   Lactic Acid, Venous 3.2 (*)    All other components within normal limits  RESPIRATORY PANEL BY RT PCR (FLU A&B, COVID)  CULTURE, BLOOD (ROUTINE X 2)  CULTURE, BLOOD (ROUTINE X 2)  URINE CULTURE  RESPIRATORY PANEL BY PCR  CULTURE, RESPIRATORY  ETHANOL  URINE DRUG SCREEN, QUALITATIVE ([facility] ONLY)  PROCALCITONIN  LACTIC ACID, PLASMA  STREP PNEUMONIAE URINARY ANTIGEN  LEGIONELLA PNEUMOPHILA SEROGP 1 UR AG  TROPONIN I (HIGH SENSITIVITY)   ____________________________________________  EKG  ED ECG REPORT I, Rudene Re, the attending physician, personally viewed and interpreted this ECG.  Atrial fibrillation with left bundle branch block, rate of 117, prolonged QTC, no ST elevations.  New when compared to prior from 2 weeks ago. ____________________________________________  RADIOLOGY  I have personally reviewed the images performed during this visit and I agree with the Radiologist's read.   Interpretation by Radiologist:  DG Chest 1 View  Result Date: November 08, 2019 CLINICAL DATA:  Intubation EXAM: CHEST  1 VIEW COMPARISON:  10/07/2019 FINDINGS: Endotracheal tube is seen 14 mm above the carina. Nasogastric tube extends into the upper abdomen beyond the margin of the examination. The lungs are symmetrically well expanded. Extensive bilateral airspace infiltrate has developed, new since prior examination, infection versus edema. No pneumothorax or pleural effusion. Cardiac size is within normal limits. No acute bone abnormality. IMPRESSION: Support tubes as described above. Interval development of extensive airspace infiltrate, infection versus edema. Electronically Signed   By: Fidela Salisbury MD   On: Nov 08, 2019 03:59  DG Abd 1 View  Result Date: 16-Nov-2019 CLINICAL DATA:  Nasogastric tube placement EXAM: ABDOMEN - 1 VIEW COMPARISON:  None. FINDINGS: Nasogastric tube extends into the distal body of the stomach. Visualized abdominal gas pattern is unremarkable. Extensive infiltrate is seen asymmetrically at the lung bases, left greater than right. IMPRESSION: Nasogastric tube tip within the distal body of the stomach. Electronically Signed   By: Fidela Salisbury MD   On: 11/16/19 04:00   CT HEAD CODE STROKE WO CONTRAST  Result Date: 11/16/2019 CLINICAL DATA:  Code stroke. Initial evaluation for acute left-sided weakness. EXAM: CT HEAD WITHOUT CONTRAST TECHNIQUE: Contiguous axial images were obtained from the base of the skull through the vertex without intravenous contrast. COMPARISON:  None available. FINDINGS: Brain: Cerebral volume within normal limits for patient age. Minimal scattered patchy hypodensity within the supratentorial cerebral white matter, most like related chronic microvascular ischemic disease, mild in nature. No evidence for acute intracranial hemorrhage. No findings to suggest acute large vessel territory infarct. No mass lesion, midline shift, or mass effect. Ventricles are normal in size without evidence for hydrocephalus. No extra-axial fluid collection identified. Vascular: No hyperdense vessel identified. Skull: Scalp soft tissues demonstrate no acute abnormality. Calvarium intact. Sinuses/Orbits: Globes and orbital soft tissues demonstrate no acute finding. Remote fracture of the right lamina papyracea noted. Visualized paranasal sinuses are clear. No mastoid effusion. ASPECTS [facility] Stroke Program Early CT Score) - Ganglionic level infarction (caudate, lentiform nuclei, internal capsule, insula, M1-M3 cortex): 7 - Supraganglionic infarction (M4-M6 cortex): 3 Total score (0-10 with 10 being normal): 10 IMPRESSION: 1. No acute intracranial infarct or other abnormality. 2. ASPECTS is 10.  Critical Value/emergent results were called by telephone at the time of interpretation on 11-16-19 at 3:14 am to provider [facility] , who verbally acknowledged these results. Electronically Signed   By: Jeannine Boga M.D.   On: 11/16/2019 03:15     ____________________________________________   PROCEDURES  Procedure(s) performed:yes .1-3 Lead EKG Interpretation Performed by: Rudene Re, MD Authorized by: Rudene Re, MD     Interpretation: abnormal     ECG rate assessment: normal     Rhythm: sinus rhythm     Critical Care performed: yes  CRITICAL CARE Performed by: Rudene Re  ?  Total critical care time: 70 min  Critical care time was exclusive of separately billable procedures and treating other patients.  Critical care was necessary to treat or prevent imminent or life-threatening deterioration.  Critical care was time spent personally by me on the following activities: development of treatment plan with patient and/or surrogate as well as nursing, discussions with consultants, evaluation of patient's response to treatment, examination of patient, obtaining history from patient or surrogate, ordering and performing treatments and interventions, ordering and review of laboratory studies, ordering and review of radiographic studies, pulse oximetry and re-evaluation of patient's condition.  ____________________________________________   INITIAL IMPRESSION / ASSESSMENT AND PLAN / ED COURSE  74 y.o. 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  Patient arrives with a right gaze deviation, left sided neglect, flaccid paralysis of the left upper extremity but moving the left lower extremity, otherwise not following commands.  Initially concerns for a large MCA stroke versus a brain bleed.  Patient was made a code stroke.  Patient was taken to CT scan.   While there patient went cardiac arrest.  Dr. Owens Shark was walking down the hallway was graciously to respond and  intubate patient while I ran the code.  Code was run per ACLS protocol with several rounds of epi.  Initial rhythm of PEA.  ROSC was achieved.  Patient intubated with succinylcholine and etomidate.  CT of the head was done showing no acute abnormalities.  Patient was then started on Precedex for sedation and nor epi for hypotension.  IV fluids were initiated as well.  At that point teleneurology called back in the camera.  While talking to her patient underwent cardiac arrest again.  Patient again in PEA arrest 2.  After several rounds of epi and chest compressions patient regained pulses and had a wide-complex tachycardia.  He was given 2 g of magnesium and 150 mg of amiodarone bolus.  He was started on amiodarone drip, continue on norepinephrine, IV fluids, Precedex for sedation.  At this point, I was able to speak with patient's sister on the phone and she asked me to give her some time to discuss with their Cory Lynch what the best plan was for the patient.  Patient then underwent cardiac arrest for the third time and regained pulses after 2 rounds of epi.  After patient regained pulses for the third time I was able to speak back with his family members (Cory Lynch and sister) who requested that no further resuscitative attempts be made if patient were to lose pulses again.  Patient's chest x-ray shows pretty extensive pneumonia bilaterally.  Due to recent admission to the hospital patient was started on cefepime and Vanco for HCAP coverage.  Covid negative.  Labs showing AKI, hypernatremia, for which patient was given 2 L bolus.  Stable anemia, no left shift. Patient remains intubated in critical condition. Discussed care with Darlyn Chamber, ICU NP for admission.  Neurology recommended giving a dose of IV Keppra which has been ordered.  Also recommended CT angio of the head and neck once patient is more stable.   Patient is now stable for that at this time.  Old medical records extensively reviewed.  Family has been made updated of patient's status and work-up.      _____________________________________________ Please note:  Patient was evaluated in Emergency Department today for the symptoms described in the history of present illness. Patient was evaluated in the context of the global COVID-19 pandemic, which necessitated consideration that the patient might be at risk for infection with the SARS-CoV-2 virus that causes COVID-19. Institutional protocols and algorithms that pertain to the evaluation of patients at risk for COVID-19 are in a state of rapid change based on information released by regulatory bodies including the CDC and federal and state organizations. These policies and algorithms were followed during the patient's care in the ED.  Some ED evaluations and interventions may be delayed as a result of limited staffing during the pandemic.   [place] Controlled Substance Database was reviewed by me. ____________________________________________   FINAL CLINICAL IMPRESSION(S) / ED DIAGNOSES   Final diagnoses:  Cardiac arrest ([place])  HCAP (healthcare-associated pneumonia)  AKI (acute kidney injury) ([place])  Hypernatremia      NEW MEDICATIONS STARTED DURING THIS VISIT:  ED Discharge Orders    None       Note:  This document was prepared using Dragon voice recognition software and may include unintentional dictation errors.    Alfred Levins, [place], MD 2019-11-06 [phone]

## 2019-10-30 NOTE — ED Notes (Signed)
Darlyn Chamber ICU NP in to eval

## 2019-10-30 NOTE — Death Summary Note (Signed)
DEATH SUMMARY   Patient Details  Name: Cory Lynch MRN: [id number] DOB: [birth year]  Admission/Discharge Information   Admit Date:  Oct 27, 2019  Date of Death:  10/27/2019  Time of Death:  11:18 HRS  Length of Stay: 0  Referring Physician: [facility]   Reason(s) for Hospitalization  Suspicion for stroke  Diagnoses  Preliminary cause of death:   Cardiac arrest Secondary Diagnoses (including complications and co-morbidities):  Active Problems:   Cardiac arrest ([place]) Mycosis fungoides stage IV Atrial fibrillation  Brief Hospital Course (including significant findings, care, treatment, and services provided and events leading to death)  Cory Lynch is a 74 y.o. year old male with a past medical history of mycosis fungoides, stage IVa, on chemo at [facility], hypertension, hyperlipidemia, diabetes mellitus who presents to [facility] ED on 10-27-2019 from his nursing home facility due to altered mental status.  His last known well time was at 10:30 PM last night 10/22/2019.  When nursing staff came to check on him in the middle the night he was found unresponsive with right gaze deviation and not moving his left side.  EMS attempted to take the patient to the local ED in [place] but they were told that the hospital was on diversion which he was subsequently transported to [facility].  Upon arrival to the ED he had a pulse, was spontaneously breathing, was awake, but was not following commands or answering questions, along with left-sided neglect and flaccid paralysis of the left upper extremity noted.  Given his neurological exam there was concern for large MCA stroke versus brain bleed, which a code stroke was called.  He was taken emergently to CT scan, where he suffered a PEA cardiac arrest while in CT.  ACLS was begun immediately with several rounds of epinephrine administered.  ED provider intubated the patient successfully.  CT head showed no acute abnormalities.  He  was then placed on Precedex for sedation, along with IV fluids and Levophed infusion for hypotension.  TeleNeurology was assessing pt after return to ED room, and during the exam patient underwent cardiac arrest again.  Again patient was in PEA, requiring several rounds of epi and CPR with ROSC obtained.  Obtained rhythm was a wide-complex tachycardia which he was given 2 g of magnesium and 150 mg of amiodarone bolus.  He was continued on amiodarone drip.  Patient again suffered a third PEA arrest with ROSC obtained after 2 rounds of epi.  Dr. Alfred Levins was able to contact the family via telephone regarding his critical condition and poor prognosis, which family elected to make patient DNR/DNI.  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  It is noted that he had been treated at [facility] for mycosis fungoides stage IVa with gemcitabine however this had been on hold because of potential side effects that were noted to include lower extremity edema, blood dyscrasias and hypothyroidism.  The patient was awaiting bed in the ICU and was still in the emergency room when his heart rate dropped to the 20s and then subsequently became asystolic.  He was pronounced dead at 11:18 hrs.  Family was in attendance.  Condolences were offered.    Pertinent Labs and Studies  Significant Diagnostic Studies DG Chest 1 View  Result Date: 11/08/2019 CLINICAL DATA:  Intubation EXAM: CHEST  1 VIEW COMPARISON:  10/07/2019 FINDINGS: Endotracheal tube is seen 14 mm above the carina. Nasogastric tube extends into the upper abdomen beyond the margin of the examination. The lungs are symmetrically well expanded. Extensive bilateral airspace infiltrate has developed, new since  prior examination, infection versus edema. No pneumothorax or pleural effusion. Cardiac size is within normal limits. No acute bone abnormality. IMPRESSION: Support tubes as described above. Interval development of extensive airspace infiltrate, infection versus edema. Electronically Signed   By: Fidela Salisbury MD   On: 11-08-19 03:59   DG Abd 1 View  Result Date: 11/08/2019 CLINICAL DATA:  Nasogastric tube placement EXAM: ABDOMEN - 1 VIEW COMPARISON:  None. FINDINGS: Nasogastric tube extends into the distal body of the stomach. Visualized abdominal gas pattern is unremarkable. Extensive infiltrate is seen asymmetrically at the lung bases, left greater than right. IMPRESSION: Nasogastric tube tip within the distal body of the stomach. Electronically Signed   By: Fidela Salisbury MD   On: November 08, 2019 04:00   Portable chest 1 View  Result Date: 10/07/2019 CLINICAL DATA:  Septic shock EXAM: PORTABLE CHEST 1 VIEW COMPARISON:  Two days ago FINDINGS: Low volume chest with mild vascular prominence. There is no edema, consolidation, effusion, or pneumothorax. Mild cardiomegaly accentuated by low volumes. IMPRESSION: Stable low volume chest. Electronically Signed   By: Monte Fantasia M.D.   On: 10/07/2019 05:37   DG Chest Port 1 View  Result Date: 10/05/2019 CLINICAL DATA:  Hypotension. Upper and lower extremity swelling. Possible sepsis. Ex-smoker. EXAM: PORTABLE CHEST 1 VIEW COMPARISON:  10/02/2019 FINDINGS: Stable enlarged cardiac silhouette, clear lungs and mildly prominent pulmonary vasculature. Thoracic spine degenerative changes. IMPRESSION: Stable cardiomegaly and mild pulmonary vascular congestion. Electronically Signed   By: Claudie Revering M.D.   On: 10/05/2019 19:26   DG Chest Portable 1 View  Result Date: 10/02/2019 CLINICAL DATA:  Leg swelling.  Leg pain. EXAM: PORTABLE CHEST 1 VIEW COMPARISON:  None. FINDINGS: Mild cardiomegaly. Mild peribronchial thickening. No focal airspace disease, pleural  effusion or pneumothorax. No acute osseous abnormalities are seen. IMPRESSION: Mild cardiomegaly. Mild peribronchial thickening may be chronic or seen with bronchitis. Electronically Signed   By: Keith Rake M.D.   On: 10/02/2019 23:55   ECHOCARDIOGRAM COMPLETE  Result Date: 10/06/2019    ECHOCARDIOGRAM REPORT   Patient Name:   Cory Lynch Date of Exam: 10/06/2019 Medical Rec #:  [id number]       Height:       64.0 in Accession #:    [id number]      Weight:       224.0 lb Date of Birth:  [birth year]        BSA:          2.053 m Patient Age:    57 years        BP:           97/52 mmHg Patient Gender: M               HR:           97 bpm. Exam Location:  [facility] Procedure: 2D Echo Indications:    Hypotension [[id number]  History:        Patient has no prior history of Echocardiogram examinations.                 Risk Factors:Diabetes, Hypertension, Dyslipidemia and Former  Smoker. Hypotension.  Sonographer:    Leavy Cella RDCS (AE) Referring Phys: [place]  1. Left ventricular ejection fraction, by estimation, is 65 to 70%. The left ventricle has normal function. The left ventricle has no regional wall motion abnormalities. Left ventricular diastolic parameters are indeterminate.  2. Right ventricular systolic function is normal. The right ventricular size is normal. There is normal pulmonary artery systolic pressure. The estimated right ventricular systolic pressure is 32.6 mmHg.  3. The mitral valve is grossly normal. Trivial mitral valve regurgitation.  4. The aortic valve is tricuspid. Aortic valve regurgitation is not visualized.  5. The inferior vena cava is normal in size with greater than 50% respiratory variability, suggesting right atrial pressure of 3 mmHg. FINDINGS  Left Ventricle: Left ventricular ejection fraction, by estimation, is 65 to 70%. The left ventricle has normal function. The left ventricle has no regional wall motion abnormalities. The left  ventricular internal cavity size was normal in size. There is  borderline left ventricular hypertrophy. Left ventricular diastolic parameters are indeterminate. Right Ventricle: The right ventricular size is normal. No increase in right ventricular wall thickness. Right ventricular systolic function is normal. There is normal pulmonary artery systolic pressure. The tricuspid regurgitant velocity is 2.49 m/s, and  with an assumed right atrial pressure of 3 mmHg, the estimated right ventricular systolic pressure is 71.2 mmHg. Left Atrium: Left atrial size was normal in size. Right Atrium: Right atrial size was normal in size. Pericardium: There is no evidence of pericardial effusion. Presence of pericardial fat pad. Mitral Valve: The mitral valve is grossly normal. Trivial mitral valve regurgitation. Tricuspid Valve: The tricuspid valve is grossly normal. Tricuspid valve regurgitation is trivial. Aortic Valve: The aortic valve is tricuspid. Aortic valve regurgitation is not visualized. Mild aortic valve annular calcification. Pulmonic Valve: The pulmonic valve was grossly normal. Pulmonic valve regurgitation is trivial. Aorta: The aortic root is normal in size and structure. Venous: The inferior vena cava is normal in size with greater than 50% respiratory variability, suggesting right atrial pressure of 3 mmHg. IAS/Shunts: No atrial level shunt detected by color flow Doppler.  LEFT VENTRICLE PLAX 2D LVIDd:         4.90 cm  Diastology LVIDs:         3.06 cm  LV e' lateral:   8.81 cm/s LV PW:         1.14 cm  LV E/e' lateral: 11.3 LV IVS:        1.04 cm  LV e' medial:    6.74 cm/s LVOT diam:     1.80 cm  LV E/e' medial:  14.7 LVOT Area:     2.54 cm  RIGHT VENTRICLE RV S prime:     14.00 cm/s TAPSE (M-mode): 3.7 cm LEFT ATRIUM             Index       RIGHT ATRIUM           Index LA diam:        3.40 cm 1.66 cm/m  RA Area:     11.20 cm LA Vol (A2C):   75.4 ml 36.73 ml/m RA Volume:   21.80 ml  10.62 ml/m LA Vol  (A4C):   37.1 ml 18.07 ml/m LA Biplane Vol: 55.3 ml 26.94 ml/m   AORTA Ao Root diam: 3.00 cm MITRAL VALVE               TRICUSPID VALVE MV Area (PHT): 5.16 cm  TR Peak grad:   24.8 mmHg MV Decel Time: 147 msec    TR Vmax:        249.00 cm/s MV E velocity: 99.40 cm/s MV A velocity: 76.70 cm/s  SHUNTS MV E/A ratio:  1.30        Systemic Diam: 1.80 cm Rozann Lesches MD Electronically signed by Rozann Lesches MD Signature Date/Time: 10/06/2019/11:31:33 AM    Final    CT HEAD CODE STROKE WO CONTRAST  Result Date: 11/07/2019 CLINICAL DATA:  Code stroke. Initial evaluation for acute left-sided weakness. EXAM: CT HEAD WITHOUT CONTRAST TECHNIQUE: Contiguous axial images were obtained from the base of the skull through the vertex without intravenous contrast. COMPARISON:  None available. FINDINGS: Brain: Cerebral volume within normal limits for patient age. Minimal scattered patchy hypodensity within the supratentorial cerebral white matter, most like related chronic microvascular ischemic disease, mild in nature. No evidence for acute intracranial hemorrhage. No findings to suggest acute large vessel territory infarct. No mass lesion, midline shift, or mass effect. Ventricles are normal in size without evidence for hydrocephalus. No extra-axial fluid collection identified. Vascular: No hyperdense vessel identified. Skull: Scalp soft tissues demonstrate no acute abnormality. Calvarium intact. Sinuses/Orbits: Globes and orbital soft tissues demonstrate no acute finding. Remote fracture of the right lamina papyracea noted. Visualized paranasal sinuses are clear. No mastoid effusion. ASPECTS [facility] Stroke Program Early CT Score) - Ganglionic level infarction (caudate, lentiform nuclei, internal capsule, insula, M1-M3 cortex): 7 - Supraganglionic infarction (M4-M6 cortex): 3 Total score (0-10 with 10 being normal): 10 IMPRESSION: 1. No acute intracranial infarct or other abnormality. 2. ASPECTS is 10. Critical  Value/emergent results were called by telephone at the time of interpretation on 11-07-19 at 3:14 am to provider [facility] , who verbally acknowledged these results. Electronically Signed   By: Jeannine Boga M.D.   On: November 07, 2019 03:15    Microbiology Recent Results (from the past 240 hour(s))  Respiratory Panel by RT PCR (Flu A&B, Covid) - Nasopharyngeal Swab     Status: None   Collection Time: 11/07/19  3:25 AM   Specimen: Nasopharyngeal Swab  Result Value Ref Range Status   SARS Coronavirus 2 by RT PCR NEGATIVE NEGATIVE Final    Comment: (NOTE) SARS-CoV-2 target nucleic acids are NOT DETECTED.  The SARS-CoV-2 RNA is generally detectable in upper respiratoy specimens during the acute phase of infection. The lowest concentration of SARS-CoV-2 viral copies this assay can detect is 131 copies/mL. A negative result does not preclude SARS-Cov-2 infection and should not be used as the sole basis for treatment or other patient management decisions. A negative result may occur with  improper specimen collection/handling, submission of specimen other than nasopharyngeal swab, presence of viral mutation(s) within the areas targeted by this assay, and inadequate number of viral copies (<131 copies/mL). A negative result must be combined with clinical observations, patient history, and epidemiological information. The expected result is Negative.  Fact Sheet for Patients:  PinkCheek.be  Fact Sheet for Healthcare Providers:  GravelBags.it  This test is no t yet approved or cleared by the [place] FDA and  has been authorized for detection and/or diagnosis of SARS-CoV-2 by FDA under an Emergency Use Authorization (EUA). This EUA will remain  in effect (meaning this test can be used) for the duration of the COVID-19 declaration under Section 564(b)(1) of the Act, 21 U.S.C. section 360bbb-3(b)(1), unless the  authorization is terminated or revoked sooner.     Influenza A by PCR NEGATIVE NEGATIVE Final  Influenza B by PCR NEGATIVE NEGATIVE Final    Comment: (NOTE) The Xpert Xpress SARS-CoV-2/FLU/RSV assay is intended as an aid in  the diagnosis of influenza from Nasopharyngeal swab specimens and  should not be used as a sole basis for treatment. Nasal washings and  aspirates are unacceptable for Xpert Xpress SARS-CoV-2/FLU/RSV  testing.  Fact Sheet for Patients: PinkCheek.be  Fact Sheet for Healthcare Providers: GravelBags.it  This test is not yet approved or cleared by the [place] FDA and  has been authorized for detection and/or diagnosis of SARS-CoV-2 by  FDA under an Emergency Use Authorization (EUA). This EUA will remain  in effect (meaning this test can be used) for the duration of the  Covid-19 declaration under Section 564(b)(1) of the Act, 21  U.S.C. section 360bbb-3(b)(1), unless the authorization is  terminated or revoked. Performed at [facility], [place], [place] [address]   Blood culture (routine x 2)     Status: None (Preliminary result)   Collection Time: Nov 21, 2019  4:37 AM   Specimen: BLOOD  Result Value Ref Range Status   Specimen Description BLOOD RIGHT ARM  Final   Special Requests   Final    BOTTLES DRAWN AEROBIC AND ANAEROBIC Blood Culture adequate volume   Culture   Final    NO GROWTH < 12 HOURS Performed at [facility], [address] [place], [place] [address]    Report Status PENDING  Incomplete  Blood culture (routine x 2)     Status: None (Preliminary result)   Collection Time: 11/21/19  4:37 AM   Specimen: BLOOD  Result Value Ref Range Status   Specimen Description BLOOD RIGHT [facility]  Final   Special Requests   Final    BOTTLES DRAWN AEROBIC AND ANAEROBIC Blood Culture adequate volume   Culture   Final    NO GROWTH < 12 HOURS Performed at [facility], [address], [place], [place] [address]    Report Status PENDING  Incomplete    Lab Basic Metabolic Panel: Recent Labs  Lab Nov 21, 2019 0232  NA 151*  K 4.3  CL 115*  CO2 25  GLUCOSE 148*  BUN 31*  CREATININE 1.68*  CALCIUM 8.5*   Liver Function Tests: Recent Labs  Lab 21-Nov-2019 0232  AST 25  ALT 24  ALKPHOS 120  BILITOT 0.7  PROT 6.9  ALBUMIN 3.0*   No results for input(s): LIPASE, AMYLASE in the last 168 hours. No results for input(s): AMMONIA in the last 168 hours. CBC: Recent Labs  Lab 2019-11-21 0232  WBC 11.5*  NEUTROABS 4.3  HGB 8.5*  HCT 28.1*  MCV 94.3  PLT 78*   Cardiac Enzymes: No results for input(s): CKTOTAL, CKMB, CKMBINDEX, TROPONINI in the last 168 hours. Sepsis Labs: Recent Labs  Lab Nov 21, 2019 0232 Nov 21, 2019 0437 November 21, 2019 0611  PROCALCITON 0.14  --   --   WBC 11.5*  --   --   LATICACIDVEN  --  3.2* 3.1*    Procedures/Operations  Intubation 9/24 ACLS protocol x3 9/24   C. Derrill Kay, MD  PCCM 11-21-2019, 11:53 AM   *This note was dictated using voice recognition software/Dragon.  Despite best efforts to proofread, errors can occur which can change the meaning.  Any change was purely unintentional.

## 2019-10-30 NOTE — ED Triage Notes (Signed)
To ED Ems from [facility] in [place], L arm flaccid. LKN 2240. Pt not answering triage questions. O2 86% on 2L [place], increased to 6L with sat in improvement to 90%. BG 130 on arrival.

## 2019-10-30 NOTE — ED Notes (Addendum)
MD Bradler at bedside, no pulses at this time the patient is DNR.  TOD is 11:18. Family at bedside. All monitors and IV medications turned off.

## 2019-10-30 NOTE — ED Notes (Signed)
Pt moving head around and right arm, pulses present at this time pending admission

## 2019-10-30 NOTE — ED Notes (Signed)
Pt still pulseless, CPR continued.

## 2019-10-30 NOTE — ED Notes (Addendum)
Pt transported to CT.  Primary RN called charge to bring VORB for ativan to help patient get through CT d/t movement.

## 2019-10-30 NOTE — Progress Notes (Signed)
PHARMACY CONSULT NOTE - FOLLOW UP  Pharmacy Consult for Electrolyte Monitoring and Replacement   Recent Labs: Potassium (mmol/L)  Date Value  Oct 26, 2019 4.3   Magnesium (mg/dL)  Date Value  10/09/2019 2.1   Calcium (mg/dL)  Date Value  10-26-2019 8.5 (L)   Albumin (g/dL)  Date Value  26-Oct-2019 3.0 (L)   Phosphorus (mg/dL)  Date Value  10/09/2019 2.7   Sodium (mmol/L)  Date Value  October 26, 2019 151 (H)     Assessment: 9/24:  Albumin = 3.0,  Ca = 8.5,  Corrected Calcium = 9.3   Goal of Therapy:  Electrolytes WNL   Plan:  No additional electrolyte supplementation needed at this time.   Orene Desanctis ,PharmD Clinical Pharmacist 26-Oct-2019 6:13 AM

## 2019-10-30 NOTE — Progress Notes (Signed)
Pharmacy Antibiotic Note  Cory Lynch is a 74 y.o. male admitted on 2019/11/05 with pneumonia.  Pharmacy has been consulted for Vancomycin, Cefepime dosing.  Plan: Vancomycin 1 gm IV X 1 given in ED on 9/24 @ 0500. Additional vancomycin 1500 mg IV X 1 ordered to make total loading dose of 2500 mg.  Vancomycin 1250 mg IV Q24H ordered to start on 9/25 @ 0500.   Cefepime 1 gm IV X 1 given in ED on 9/24 @ 0424. Additional cefepime 1 gm IV X 1 ordered to make total starting dose of 2 gm. Cefepime 2 gm IV Q12H ordered to start on 09/24 @ 1600.   Height: 5' 4.02" (162.6 cm) Weight: 111.1 kg (245 lb) IBW/kg (Calculated) : 59.24  Temp (24hrs), Avg:88.4 F (31.3 C), Min:87.8 F (31 C), Max:88.8 F (31.6 C)  Recent Labs  Lab 11-05-2019 0232  WBC 11.5*  CREATININE 1.68*    Estimated Creatinine Clearance: 45 mL/min (A) (by C-G formula based on SCr of 1.68 mg/dL (H)).    No Known Allergies  Antimicrobials this admission:   >>    >>   Dose adjustments this admission:   Microbiology results:  BCx:   UCx:    Sputum:    MRSA PCR:   Thank you for allowing pharmacy to be a part of this patient's care.  Yarlin Breisch D 05-Nov-2019 5:51 AM

## 2019-10-30 NOTE — ED Notes (Signed)
No change in condition, NSR noted on the monitor HR 61

## 2019-10-30 NOTE — H&P (Signed)
PULMONARY / CRITICAL CARE MEDICINE   Name: Cory Lynch MRN: [id number] DOB: 09/12/1945    ADMISSION DATE:  2019-11-16 CONSULTATION DATE:  11/16/2019  REFERRING MD:  Dr. Alfred Levins  CHIEF COMPLAINT:  Cardiac arrest  BRIEF DISCUSSION: 74 y.o. 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 Cardiology, Neurology, and Palliative Care been consulted.  HISTORY OF PRESENT ILLNESS:   Cory Lynch is a 74 y.o. 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  His last known well time was at 10:30 PM last night 10/22/2019.  When nursing staff came to check on him in the middle the night he was found unresponsive with right gaze deviation and not moving his left side.  EMS attempted to take the patient to the local ED in [place] but they were told that the hospital was on diversion which he was subsequently transported to [facility].  Upon arrival to the ED he had a pulse, was spontaneously breathing, was awake, but was not following commands or answering questions, along with left-sided neglect and flaccid paralysis of the left upper extremity noted.  Given his neurological exam there was concern for large MCA stroke versus brain bleed, which a code stroke was called.  He was taken emergently to CT scan, where he suffered a PEA cardiac arrest while in CT.  ACLS was begun immediately with several rounds of epinephrine administered.  ED provider intubated the patient successfully.  CT head showed no acute abnormalities.  He was then placed on Precedex for sedation, along with IV fluids and Levophed infusion for hypotension.   TeleNeurology was assessing pt after return to ED room, and during the exam patient underwent cardiac arrest again.  Again patient was in PEA, requiring several rounds of epi and CPR with ROSC obtained.  Obtained rhythm was a wide-complex tachycardia which he was given 2 g of magnesium and 150 mg of amiodarone bolus.  He was continued on amiodarone drip.  Patient again suffered a third PEA arrest with ROSC obtained after 2 rounds of epi.  Dr. Alfred Levins was able to contact the family via telephone regarding his critical condition and poor prognosis, which family elected to make patient DNR/DNI.  Initial work-up shows sodium 151, glucose 148, BUN 31, creatinine 1.68, procalcitonin 0.14, WBC 11.5, hemoglobin 8.5, platelets 78, prothrombin time 15.5, INR 1.3, lactic acid 3.9, and high-sensitivity troponin 9. Chest x-ray with bilateral infiltrates concerning for pneumonia versus pulmonary edema.  Post intubation ABG with pH 7.18/PCO2 59/PO2 59/bicarb 22.  His COVID-19 PCR is negative, and influenza AMB PCR negative.  Urinalysis is consistent with UTI, urine drug screen is negative.  Postarrest EKG with atrial fibrillation and new left bundle branch block.  He met sepsis criteria therefore he was given IV fluids and placed on vancomycin and cefepime.  PCCM is asked to admit the patient for further work-up and treatment of acute hypoxic hypercapnic respiratory failure secondary to cardiac arrest, cardiogenic shock, septic shock in setting of HCAP and UTI, along with concern for CVA and anoxic brain injury. Cardiology, Neurology, and Palliative Care been consulted.  PAST MEDICAL HISTORY :  He  has a past medical  history of Diabetes mellitus without complication ([place]), Hypercholesteremia, Hypertension, Hypothyroid, and Mycosis fungoides ([place]).  PAST SURGICAL HISTORY: He  has a past surgical history that includes Cystoscopy with litholapaxy (N/A, 09/17/2016) and Holmium laser application (N/A, 10/27/2444).  No Known  Allergies  No current facility-administered medications on file prior to encounter.   Current Outpatient Medications on File Prior to Encounter  Medication Sig  . acetaminophen (TYLENOL) 325 MG tablet Take 2 tablets (650 mg total) by mouth every 6 (six) hours as needed for mild pain (or Fever >/= 101).  Marland Kitchen aspirin EC 81 MG tablet Take 1 tablet (81 mg total) by mouth daily with breakfast. Swallow whole.  . cetirizine (ZYRTEC) 10 MG tablet Take 1 tablet by mouth daily.  . furosemide (LASIX) 20 MG tablet Take 1 tablet (20 mg total) by mouth daily.  Marland Kitchen gabapentin (NEURONTIN) 600 MG tablet Take 600 mg by mouth 3 (three) times daily.  Marland Kitchen HYDROcodone-acetaminophen (NORCO/VICODIN) 5-325 MG tablet Take 1 tablet by mouth 2 (two) times daily as needed.  . insulin aspart (NOVOLOG) 100 UNIT/ML FlexPen Inject 0-10 Units into the skin 3 (three) times daily with meals. insulin aspart (novoLOG) injection 0-10 Units 0-10 Units Subcutaneous, 3 times daily with meals CBG < 70: Implement Hypoglycemia Standing Orders and refer to Hypoglycemia Standing Orders sidebar report  CBG 70 - 120: 0 unit CBG 121 - 150: 0 unit  CBG 151 - 200: 1 unit CBG 201 - 250: 2 units CBG 251 - 300: 4 units CBG 301 - 350: 6 units  CBG 351 - 400: 8 units  CBG > 400: 10 units  . levothyroxine (SYNTHROID) 125 MCG tablet Take 125 mcg by mouth daily.  . Omega-3 Fatty Acids (FISH OIL) 1000 MG CAPS Take 1 capsule by mouth daily.  . pantoprazole (PROTONIX) 40 MG tablet Take 1 tablet (40 mg total) by mouth daily at 6 (six) AM.  . tamsulosin (FLOMAX) 0.4 MG CAPS capsule Take 1 capsule (0.4 mg total) by mouth daily after supper.  . zolpidem (AMBIEN) 5 MG tablet Take 5 mg by mouth at bedtime as needed.    FAMILY HISTORY:  His He indicated that his mother is deceased. He indicated that his father is deceased. He indicated that all of his three sisters are alive. He indicated that two of his three brothers are alive.   SOCIAL HISTORY: He  reports that  he quit smoking about 6 years ago. His smoking use included cigarettes. He smoked 0.25 packs per day. He has never used smokeless tobacco. He reports that he does not drink alcohol and does not use drugs.    COVID-19 DISASTER DECLARATION:  FULL CONTACT PHYSICAL EXAMINATION WAS NOT POSSIBLE DUE TO TREATMENT OF COVID-19 AND  CONSERVATION OF PERSONAL PROTECTIVE EQUIPMENT, LIMITED EXAM FINDINGS INCLUDE-  Patient assessed or the symptoms described in the history of present illness.  In the context of the Global COVID-19 pandemic, which necessitated consideration that the patient might be at risk for infection with the SARS-CoV-2 virus that causes COVID-19, Institutional protocols and algorithms that pertain to the evaluation of patients at risk for COVID-19 are in a state of rapid change based on information released by regulatory bodies including the CDC and federal and state organizations. These policies and algorithms were followed during the patient's care while in hospital.   REVIEW OF SYSTEMS:   Unable to assess due to critical illness, intubation and sedation  SUBJECTIVE:  Unable to assess due to critical illness, intubation and sedation  VITAL SIGNS: BP 137/67   Pulse (!) 46   Temp (!) 88.8 F (31.6 C)   Resp 14   Ht 5' 4.02" (1.626 m)   SpO2 92%   BMI 38.62 kg/m   HEMODYNAMICS:    VENTILATOR SETTINGS: Vent Mode: AC FiO2 (%):  [100 %] 100 % Set Rate:  [16 bmp] 16 bmp Vt Set:  [500 mL] 500 mL PEEP:  [8 cmH20] 8 cmH20  INTAKE / OUTPUT: No intake/output data recorded.  PHYSICAL EXAMINATION: General:  Critically ill appearing male, laying in bed, intubated and sedated Neuro:  Sedated, withdraws from pain (does not follow commands), cough/gag reflex present, pupils PERRL (sluggish @ 2 mm) HEENT:  Atraumatic, normocephalic, neck supple, no JVD, dry mucus membranes, ETT in place Cardiovascular:  Bradycardia, regular rhythm, s1s2, no M/R/G Lungs:  Coarse breath sounds  bilaterally, agonal respirations on vent Abdomen:  Obese, soft, nontender, nondistended, no guarding or rebound tenderness Musculoskeletal:  No edema Skin:  Cool and dry.  No obvious rashes, lesions, or ulcerations  LABS:  BMET Recent Labs  Lab 11/21/2019 0232  NA 151*  K 4.3  CL 115*  CO2 25  BUN 31*  CREATININE 1.68*  GLUCOSE 148*    Electrolytes Recent Labs  Lab 11/21/19 0232  CALCIUM 8.5*    CBC Recent Labs  Lab Nov 21, 2019 0232  WBC 11.5*  HGB 8.5*  HCT 28.1*  PLT 78*    Coag's Recent Labs  Lab November 21, 2019 0232  APTT 70*  INR 1.3*    Sepsis Markers No results for input(s): LATICACIDVEN, PROCALCITON, O2SATVEN in the last 168 hours.  ABG Recent Labs  Lab 21-Nov-2019 0325  PHART 7.18*  PCO2ART 59*  PO2ART 58*    Liver Enzymes Recent Labs  Lab Nov 21, 2019 0232  AST 25  ALT 24  ALKPHOS 120  BILITOT 0.7  ALBUMIN 3.0*    Cardiac Enzymes No results for input(s): TROPONINI, PROBNP in the last 168 hours.  Glucose Recent Labs  Lab 11/21/2019 0230  GLUCAP 130*    Imaging DG Chest 1 View  Result Date: 11/21/19 CLINICAL DATA:  Intubation EXAM: CHEST  1 VIEW COMPARISON:  10/07/2019 FINDINGS: Endotracheal tube is seen 14 mm above the carina. Nasogastric tube extends into the upper abdomen beyond the margin of the examination. The lungs are symmetrically well expanded. Extensive bilateral airspace infiltrate has developed, new since prior examination, infection versus edema. No pneumothorax or pleural effusion. Cardiac size is within normal limits. No acute bone abnormality. IMPRESSION: Support tubes as described above. Interval development of extensive airspace infiltrate, infection versus edema. Electronically Signed   By: Fidela Salisbury MD   On: 2019-11-21 03:59   DG Abd 1 View  Result Date: 21-Nov-2019 CLINICAL DATA:  Nasogastric tube placement EXAM: ABDOMEN - 1 VIEW COMPARISON:  None. FINDINGS: Nasogastric tube extends into the distal body of the  stomach. Visualized abdominal gas pattern is unremarkable. Extensive infiltrate is seen asymmetrically at the lung bases, left greater than right. IMPRESSION: Nasogastric tube tip within the distal body of the stomach. Electronically Signed   By: Fidela Salisbury MD   On: 11-21-2019 04:00   CT HEAD CODE STROKE WO CONTRAST  Result Date: 11-21-19 CLINICAL DATA:  Code stroke. Initial evaluation for acute left-sided weakness. EXAM: CT HEAD WITHOUT CONTRAST TECHNIQUE: Contiguous axial images were obtained from the base of the skull through the vertex without intravenous contrast. COMPARISON:  None available. FINDINGS: Brain: Cerebral volume within normal limits for patient age. Minimal scattered patchy hypodensity  within the supratentorial cerebral white matter, most like related chronic microvascular ischemic disease, mild in nature. No evidence for acute intracranial hemorrhage. No findings to suggest acute large vessel territory infarct. No mass lesion, midline shift, or mass effect. Ventricles are normal in size without evidence for hydrocephalus. No extra-axial fluid collection identified. Vascular: No hyperdense vessel identified. Skull: Scalp soft tissues demonstrate no acute abnormality. Calvarium intact. Sinuses/Orbits: Globes and orbital soft tissues demonstrate no acute finding. Remote fracture of the right lamina papyracea noted. Visualized paranasal sinuses are clear. No mastoid effusion. ASPECTS [facility] Stroke Program Early CT Score) - Ganglionic level infarction (caudate, lentiform nuclei, internal capsule, insula, M1-M3 cortex): 7 - Supraganglionic infarction (M4-M6 cortex): 3 Total score (0-10 with 10 being normal): 10 IMPRESSION: 1. No acute intracranial infarct or other abnormality. 2. ASPECTS is 10. Critical Value/emergent results were called by telephone at the time of interpretation on 10/29/2019 at 3:14 am to provider [facility] , who verbally acknowledged these results. Electronically  Signed   By: Jeannine Boga M.D.   On: October 29, 2019 03:15     STUDIES:  9/24: CT Head>>no acute abnormality 9/24: CXR>>Endotracheal tube is seen 14 mm above the carina. Nasogastric tube extends into the upper abdomen beyond the margin of the examination. The lungs are symmetrically well expanded. Extensive bilateral airspace infiltrate has developed, new since prior examination, infection versus edema. No pneumothorax or pleural effusion. Cardiac size is within normal limits. No acute bone abnormality. 9/24: 2D Echocardiogram>>  CULTURES: SARS-CoV-2 PCR 9/24>>negative Influenza A & B PCR 9/24>>negative Respiratory Viral Panel 9/24>> Urine 9/24>> Blood culture x2 9/24>> Tracheal aspirate 9/24>> Strep pneumo urinary antigen 9/24>> Legionella urinary antigen 9/24>>  ANTIBIOTICS: Cefepime 9/24>> Vancomycin 9/24>>  SIGNIFICANT EVENTS: 9/24>>Presented to ED for stroke like symptoms 9/24>>Cardiac arrest x3 in ED; made DNR by ED provider after speaking with family  LINES/TUBES: ETT 9/24>>   ASSESSMENT / PLAN:  PULMONARY A: Acute Hypoxic Hypercapnic Respiratory Failure in the setting of Cardiac arrest & HCAP P:   Full vent support Wean FiO2 & PEEP as tolerated to maintain O2 sats >92% Follow intermittent CXR & ABG as needed VAP protocol Spontaneous breathing trials when respiratory parameters mets and mental status permits Continue Cefepime & Vancomycin Prn Bronchodilators  CARDIOVASCULAR A:  Cardiac arrest (PEA) Shock, suspect Cardiogenic +/- Septic P:  Continuous cardiac monitoring Maintain MAP >65 IV fluids Vasopressors as needed to maintain MAP goal Trend troponin Follow up EKG with A-fib and new LBBB Cardiology consulted, appreciate input (discussed with Dr. Humphrey Rolls) Obtain 2D Echocardiogram Urine drug screen is negative  RENAL A:   AKI Hypernatremia P:   Monitor I&O's / urinary output Follow BMP Ensure adequate renal perfusion Avoid nephrotoxic  agents as able Replace electrolytes as indicated IV fluids  GASTROINTESTINAL A:   No acute issues P:   NPO OG Tube to LIS Pepcid for SUP  HEMATOLOGIC A:   Anemia Thrombocytopenia (suspect secondary to mycosis fugnoides & chemotherapy +/- sepsis) P:  Monitor for S/Sx of bleeding Trend CBC SCD's for VTE Prophylaxis (no chemical prophylaxis given thrombocytopenia) Transfuse for Hgb <7 Transfuse platelets for PLT count <50 with active bleeding  INFECTIOUS A:   Severe Sepsis in setting of HCAP & UTI P:   Monitor fever curve Trend WBC's & Procalcitonin Follow cultures as above Continue Cefepime and Vancomycin  ENDOCRINE A:   Diabetes Mellitus  P:   CBG's SSI Follow ICU Hypo/Hyperglycemia protocol   NEUROLOGIC A:   Concern for Acute CVA  Concern for  anoxic brain injury P:   RASS goal: 0 to -1 Precedex gtt as needed to maintain MAP goal Avoid sedating meds as able Daily wake up assessment Initial CT Head w/o contrast negative Was evaluated by TeleNeurology, not a candidate for tPA as he is outside of timeframe window (last known well time @ 10:30 pm on 10/22/19) Placed on IV Keppra by TeleNeurolgy CTA Head/Neck, CT perfusion scan, MR Head once Hemodynamically stable Neurology consult, appreciate input Obtain EEG Aspirin   FAMILY  - Updates: No family at bedside during NP rounds.  - Inter-disciplinary family meet or Palliative Care meeting due by:  11-15-19   Pt is critically ill, prognosis is very poor and guarded.  He is at high risk for cardiac arrest and death.  Dr. Alfred Levins discussed with pt's family and they elected to make pt DNR/DNI.  Recommend COMFORT CARE MEASURES.  Will consult Palliative Care for further goals of care.    Darel Hong, AGACNP-BC [facility] Pager: [phone]  11-15-2019, 5:16 AM

## 2019-10-30 NOTE — ED Notes (Signed)
No change in condition.

## 2019-10-30 NOTE — ED Notes (Signed)
Pulse check and in PEA.

## 2019-10-30 NOTE — ED Notes (Signed)
Family at bedside, pts BP dropping and HR dropping. MD called to bedside

## 2019-10-30 NOTE — ED Notes (Signed)
Pt found to have lost pulses by primary RN while on table at CT scan.  CPR started and code blue called.  1mg  epi given at this time.

## 2019-10-30 NOTE — ED Notes (Signed)
Pt escorted to CT on monitor with RN. Pt transferred to CT table and became bradycardic into the 20s and then asystole. Compressions initiated by this RN at [place]. Medicated per MAR. 1mg  of epi given at 0248, pt placed on zoll. Intubated with 7.41f by Dr. Owens Shark after receiving etomidate and succinylcholine. CT scan obtained. Pt escorted back to room by RN, Dr. Alfred Levins, and RT on ventilator.

## 2019-10-30 NOTE — ED Notes (Signed)
Niece called for update, stated siblings will be coming to visit.

## 2019-10-30 NOTE — ED Notes (Signed)
Family at bedside speaking with Admit MD Patsey Berthold. Per Admit MD pt is still DNR and we will continue with treatment.

## 2019-10-30 NOTE — ED Notes (Signed)
Pt given another 1mg  epi at this time.

## 2019-10-30 NOTE — ED Notes (Signed)
ETT placement and OG placement verified per Xray, OG attached to LCS.

## 2019-10-30 NOTE — Consult Note (Signed)
Cory Lynch is a 74 y.o. male  [id number]  Primary Cardiologist: Neoma Laming Reason for Consultation: Cardiac arrest  HPI: This is a 74 year old African-American male who has a history of diabetes hypertension mycosis fungoides infection normally followed at [facility] with history of recent stroke presented to the hospital with cardiac arrest and is intubated and unresponsive.  I was asked to evaluate the patient because patient had left bundle branch block.   Review of Systems: Unable to get much history   Past Medical History:  Diagnosis Date  . Diabetes mellitus without complication ([place])   . Hypercholesteremia   . Hypertension   . Hypothyroid   . 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 [START ON 10/24/2019] aspirin EC  325 mg Oral Daily  . LORazepam  1 mg Intravenous Once    Infusions: . amiodarone 60 mg/hr (11-13-19 0418)  . amiodarone    . amiodarone    . ceFEPime (MAXIPIME) IV    . dexmedetomidine (PRECEDEX) IV infusion 1.2 mcg/kg/hr (2019/11/13 0347)  . famotidine (PEPCID) IV    . norepinephrine (LEVOPHED) Adult infusion 10 mcg/min (2019/11/13 0407)  . [START ON 10/24/2019] vancomycin      No Known Allergies  Social History   Socioeconomic History  . Marital status: Single    Spouse name: Not on file  . Number of children: Not on file  . Years of education: Not on file  . Highest education level: Not on file  Occupational History  . Not on file  Tobacco Use  . Smoking status: Former Smoker    Packs/day: 0.25    Types: Cigarettes    Quit date: 09/14/2013    Years since quitting: 6.1  . Smokeless tobacco: Never Used  Vaping Use  . Vaping Use: Never used  Substance and Sexual Activity  . Alcohol use: No  . Drug use: No  . Sexual activity: Not on file  Other Topics Concern  . Not on file  Social History Narrative  . Not on file   Social Determinants of Health   Financial Resource Strain:   . Difficulty of Paying Living  Expenses: Not on file  Food Insecurity:   . Worried About Charity fundraiser in the Last Year: Not on file  . Ran Out of Food in the Last Year: Not on file  Transportation Needs:   . Lack of Transportation (Medical): Not on file  . Lack of Transportation (Non-Medical): Not on file  Physical Activity:   . Days of Exercise per Week: Not on file  . Minutes of Exercise per Session: Not on file  Stress:   . Feeling of Stress : Not on file  Social Connections:   . Frequency of Communication with Friends and Family: Not on file  . Frequency of Social Gatherings with Friends and Family: Not on file  . Attends Religious Services: Not on file  . Active Member of Clubs or Organizations: Not on file  . Attends Archivist Meetings: Not on file  . Marital Status: Not on file  Intimate Partner Violence:   . Fear of Current or Ex-Partner: Not on file  . Emotionally Abused: Not on file  . Physically Abused: Not on file  . Sexually Abused: Not on file    Family History  Problem Relation Age of Onset  . Cancer Brother     PHYSICAL EXAM: Vitals:   11-13-19 0745 Nov 13, 2019 0800  BP: 110/62 (!) 117/58  Pulse: 60 (!) 41  Resp: 20 (!) 22  Temp: (!) 90.6 F (32.6 C) (!) 91 F (32.8 C)  SpO2: 97% 97%    No intake or output data in the 24 hours ending Nov 10, 2019 0843  General:  Well appearing. No respiratory difficulty HEENT: normal Neck: supple. no JVD. Carotids 2+ bilat; no bruits. No lymphadenopathy or thryomegaly appreciated. Cor: PMI nondisplaced. Regular rate & rhythm. No rubs, gallops or murmurs. Lungs: clear Abdomen: soft, nontender, nondistended. No hepatosplenomegaly. No bruits or masses. Good bowel sounds. Extremities: no cyanosis, clubbing, rash, edema Neuro: alert & oriented x 3, cranial nerves grossly intact. moves all 4 extremities w/o difficulty. Affect pleasant.  ECG: Atrial fibrillation with rapid ventricular response rate left bundle branch block  Results for  orders placed or performed during the hospital encounter of November 10, 2019 (from the past 24 hour(s))  Glucose, capillary     Status: Abnormal   Collection Time: 2019-11-10  2:30 AM  Result Value Ref Range   Glucose-Capillary 130 (H) 70 - 99 mg/dL  Ethanol     Status: None   Collection Time: 2019-11-10  2:32 AM  Result Value Ref Range   Alcohol, Ethyl (B) <10 <10 mg/dL  Protime-INR     Status: Abnormal   Collection Time: 11-10-19  2:32 AM  Result Value Ref Range   Prothrombin Time 15.5 (H) 11.4 - 15.2 seconds   INR 1.3 (H) 0.8 - 1.2  APTT     Status: Abnormal   Collection Time: 2019-11-10  2:32 AM  Result Value Ref Range   aPTT 70 (H) 24 - 36 seconds  CBC     Status: Abnormal   Collection Time: November 10, 2019  2:32 AM  Result Value Ref Range   WBC 11.5 (H) 4.0 - 10.5 K/uL   RBC 2.98 (L) 4.22 - 5.81 MIL/uL   Hemoglobin 8.5 (L) 13.0 - 17.0 g/dL   HCT 28.1 (L) 39 - 52 %   MCV 94.3 80.0 - 100.0 fL   MCH 28.5 26.0 - 34.0 pg   MCHC 30.2 30.0 - 36.0 g/dL   RDW 20.8 (H) 11.5 - 15.5 %   Platelets 78 (L) 150 - 400 K/uL   nRBC 0.4 (H) 0.0 - 0.2 %  Differential     Status: Abnormal   Collection Time: November 10, 2019  2:32 AM  Result Value Ref Range   Neutrophils Relative % 38 %   Neutro Abs 4.3 1.7 - 7.7 K/uL   Lymphocytes Relative 19 %   Lymphs Abs 2.1 0.7 - 4.0 K/uL   Monocytes Relative 7 %   Monocytes Absolute 0.8 0 - 1 K/uL   Eosinophils Relative 36 %   Eosinophils Absolute 4.2 (H) 0 - 0 K/uL   Basophils Relative 0 %   Basophils Absolute 0.0 0 - 0 K/uL   WBC Morphology MORPHOLOGY UNREMARKABLE    RBC Morphology HIDE    Smear Review Normal platelet morphology    Immature Granulocytes 0 %   Abs Immature Granulocytes 0.03 0.00 - 0.07 K/uL   Polychromasia PRESENT   Comprehensive metabolic panel     Status: Abnormal   Collection Time: 11-10-19  2:32 AM  Result Value Ref Range   Sodium 151 (H) 135 - 145 mmol/L   Potassium 4.3 3.5 - 5.1 mmol/L   Chloride 115 (H) 98 - 111 mmol/L   CO2 25 22 - 32 mmol/L    Glucose, Bld 148 (H) 70 - 99 mg/dL   BUN  31 (H) 8 - 23 mg/dL   Creatinine, Ser 1.68 (H) 0.61 - 1.24 mg/dL   Calcium 8.5 (L) 8.9 - 10.3 mg/dL   Total Protein 6.9 6.5 - 8.1 g/dL   Albumin 3.0 (L) 3.5 - 5.0 g/dL   AST 25 15 - 41 U/L   ALT 24 0 - 44 U/L   Alkaline Phosphatase 120 38 - 126 U/L   Total Bilirubin 0.7 0.3 - 1.2 mg/dL   GFR calc non Af Amer 40 (L) >60 mL/min   GFR calc Af Amer 46 (L) >60 mL/min   Anion gap 11 5 - 15  Procalcitonin - Baseline     Status: None   Collection Time: 2019/10/24  2:32 AM  Result Value Ref Range   Procalcitonin 0.14 ng/mL  Urine Drug Screen, Qualitative     Status: None   Collection Time: October 24, 2019  3:16 AM  Result Value Ref Range   Tricyclic, Ur Screen NONE DETECTED NONE DETECTED   Amphetamines, Ur Screen NONE DETECTED NONE DETECTED   MDMA (Ecstasy)Ur Screen NONE DETECTED NONE DETECTED   Cocaine Metabolite,Ur [place] NONE DETECTED NONE DETECTED   Opiate, Ur Screen NONE DETECTED NONE DETECTED   Phencyclidine (PCP) Ur S NONE DETECTED NONE DETECTED   Cannabinoid 50 Ng, Ur  NONE DETECTED NONE DETECTED   Barbiturates, Ur Screen NONE DETECTED NONE DETECTED   Benzodiazepine, Ur Scrn NONE DETECTED NONE DETECTED   Methadone Scn, Ur NONE DETECTED NONE DETECTED  Urinalysis, Routine w reflex microscopic Urine, Catheterized     Status: Abnormal   Collection Time: 10/24/2019  3:25 AM  Result Value Ref Range   Color, Urine YELLOW (A) YELLOW   APPearance CLOUDY (A) CLEAR   Specific Gravity, Urine 1.016 1.005 - 1.030   pH 6.0 5.0 - 8.0   Glucose, UA NEGATIVE NEGATIVE mg/dL   Hgb urine dipstick MODERATE (A) NEGATIVE   Bilirubin Urine NEGATIVE NEGATIVE   Ketones, ur NEGATIVE NEGATIVE mg/dL   Protein, ur 100 (A) NEGATIVE mg/dL   Nitrite NEGATIVE NEGATIVE   Leukocytes,Ua MODERATE (A) NEGATIVE   RBC / HPF >50 (H) 0 - 5 RBC/hpf   WBC, UA 21-50 0 - 5 WBC/hpf   Bacteria, UA NONE SEEN NONE SEEN   Squamous Epithelial / LPF 6-10 0 - 5   Mucus PRESENT    Hyaline  Casts, UA PRESENT    Sperm, UA PRESENT   Blood gas, arterial     Status: Abnormal   Collection Time: 10-24-19  3:25 AM  Result Value Ref Range   FIO2 1.00    Delivery systems VENTILATOR    Mode ASSIST CONTROL    VT 500 mL   LHR 16 resp/min   Peep/cpap 8.0 cm H20   pH, Arterial 7.18 (LL) 7.35 - 7.45   pCO2 arterial 59 (H) 32 - 48 mmHg   pO2, Arterial 58 (L) 83 - 108 mmHg   Bicarbonate 22.0 20.0 - 28.0 mmol/L   Acid-base deficit 6.3 (H) 0.0 - 2.0 mmol/L   O2 Saturation 81.6 %   Patient temperature 37.0    Collection site RIGHT RADIAL    Sample type ARTERIAL DRAW    Allens test (pass/fail) PASS PASS  Respiratory Panel by RT PCR (Flu A&B, Covid) - Nasopharyngeal Swab     Status: None   Collection Time: 2019/10/24  3:25 AM   Specimen: Nasopharyngeal Swab  Result Value Ref Range   SARS Coronavirus 2 by RT PCR NEGATIVE NEGATIVE   Influenza A by PCR NEGATIVE  NEGATIVE   Influenza B by PCR NEGATIVE NEGATIVE  Blood culture (routine x 2)     Status: None (Preliminary result)   Collection Time: 2019-11-06  4:37 AM   Specimen: BLOOD  Result Value Ref Range   Specimen Description BLOOD RIGHT ARM    Special Requests      BOTTLES DRAWN AEROBIC AND ANAEROBIC Blood Culture adequate volume   Culture      NO GROWTH < 12 HOURS Performed at [facility], [address] [place], [place] [address]    Report Status PENDING   Blood culture (routine x 2)     Status: None (Preliminary result)   Collection Time: 2019-11-06  4:37 AM   Specimen: BLOOD  Result Value Ref Range   Specimen Description BLOOD RIGHT AC    Special Requests      BOTTLES DRAWN AEROBIC AND ANAEROBIC Blood Culture adequate volume   Culture      NO GROWTH < 12 HOURS Performed at [facility], [address], [place], [place] [address]    Report Status PENDING   Lactic acid, plasma     Status: Abnormal   Collection Time: 06-Nov-2019  4:37 AM  Result Value Ref Range   Lactic Acid, Venous 3.2 (HH) 0.5 - 1.9 mmol/L   Troponin I (High Sensitivity)     Status: None   Collection Time: November 06, 2019  4:37 AM  Result Value Ref Range   Troponin I (High Sensitivity) 9 <18 ng/L  Lactic acid, plasma     Status: Abnormal   Collection Time: 11-06-2019  6:11 AM  Result Value Ref Range   Lactic Acid, Venous 3.1 (HH) 0.5 - 1.9 mmol/L  Troponin I (High Sensitivity)     Status: Abnormal   Collection Time: 11/06/19  6:34 AM  Result Value Ref Range   Troponin I (High Sensitivity) 18 (H) <18 ng/L   DG Chest 1 View  Result Date: 11-06-19 CLINICAL DATA:  Intubation EXAM: CHEST  1 VIEW COMPARISON:  10/07/2019 FINDINGS: Endotracheal tube is seen 14 mm above the carina. Nasogastric tube extends into the upper abdomen beyond the margin of the examination. The lungs are symmetrically well expanded. Extensive bilateral airspace infiltrate has developed, new since prior examination, infection versus edema. No pneumothorax or pleural effusion. Cardiac size is within normal limits. No acute bone abnormality. IMPRESSION: Support tubes as described above. Interval development of extensive airspace infiltrate, infection versus edema. Electronically Signed   By: Fidela Salisbury MD   On: 2019-11-06 03:59   DG Abd 1 View  Result Date: 11-06-19 CLINICAL DATA:  Nasogastric tube placement EXAM: ABDOMEN - 1 VIEW COMPARISON:  None. FINDINGS: Nasogastric tube extends into the distal body of the stomach. Visualized abdominal gas pattern is unremarkable. Extensive infiltrate is seen asymmetrically at the lung bases, left greater than right. IMPRESSION: Nasogastric tube tip within the distal body of the stomach. Electronically Signed   By: Fidela Salisbury MD   On: November 06, 2019 04:00   CT HEAD CODE STROKE WO CONTRAST  Result Date: 11-06-2019 CLINICAL DATA:  Code stroke. Initial evaluation for acute left-sided weakness. EXAM: CT HEAD WITHOUT CONTRAST TECHNIQUE: Contiguous axial images were obtained from the base of the skull through the vertex without  intravenous contrast. COMPARISON:  None available. FINDINGS: Brain: Cerebral volume within normal limits for patient age. Minimal scattered patchy hypodensity within the supratentorial cerebral white matter, most like related chronic microvascular ischemic disease, mild in nature. No evidence for acute intracranial hemorrhage. No findings to  suggest acute large vessel territory infarct. No mass lesion, midline shift, or mass effect. Ventricles are normal in size without evidence for hydrocephalus. No extra-axial fluid collection identified. Vascular: No hyperdense vessel identified. Skull: Scalp soft tissues demonstrate no acute abnormality. Calvarium intact. Sinuses/Orbits: Globes and orbital soft tissues demonstrate no acute finding. Remote fracture of the right lamina papyracea noted. Visualized paranasal sinuses are clear. No mastoid effusion. ASPECTS [facility] Stroke Program Early CT Score) - Ganglionic level infarction (caudate, lentiform nuclei, internal capsule, insula, M1-M3 cortex): 7 - Supraganglionic infarction (M4-M6 cortex): 3 Total score (0-10 with 10 being normal): 10 IMPRESSION: 1. No acute intracranial infarct or other abnormality. 2. ASPECTS is 10. Critical Value/emergent results were called by telephone at the time of interpretation on 2019-10-26 at 3:14 am to provider [facility] , who verbally acknowledged these results. Electronically Signed   By: Jeannine Boga M.D.   On: Oct 26, 2019 03:15     ASSESSMENT AND PLAN: Cardiac arrest most likely due to PEA, right now in sinus bradycardia and on Levophed and amiodarone.  Troponins are unremarkable.  Advise getting echocardiogram and will further evaluate the patient continuously with you.  Kaytlynne Neace A

## 2019-10-30 NOTE — ED Notes (Signed)
Pt noted to have a pulse, pt moving head and opening eyes. Wide complex tachycardia noted on monitor.

## 2019-10-30 NOTE — ED Notes (Signed)
PEA noted on monitor, CPR continued.

## 2019-10-30 NOTE — ED Notes (Signed)
Pt noted to have bradycardia and went pulseless, CPR started with RT and EDP at bedside.

## 2019-10-30 NOTE — ED Notes (Signed)
EDP arrival with other staff to CT at this time.

## 2019-10-30 DEATH — deceased

## 2021-08-09 IMAGING — DX DG CHEST 1V PORT
1 series · 1 of 1 positions shown · non-contrast
Comparison: 10/02/2019

CLINICAL DATA: Hypotension. Upper and lower extremity swelling.
Possible sepsis. Ex-smoker.

EXAM:
PORTABLE CHEST 1 VIEW

[chest ap]
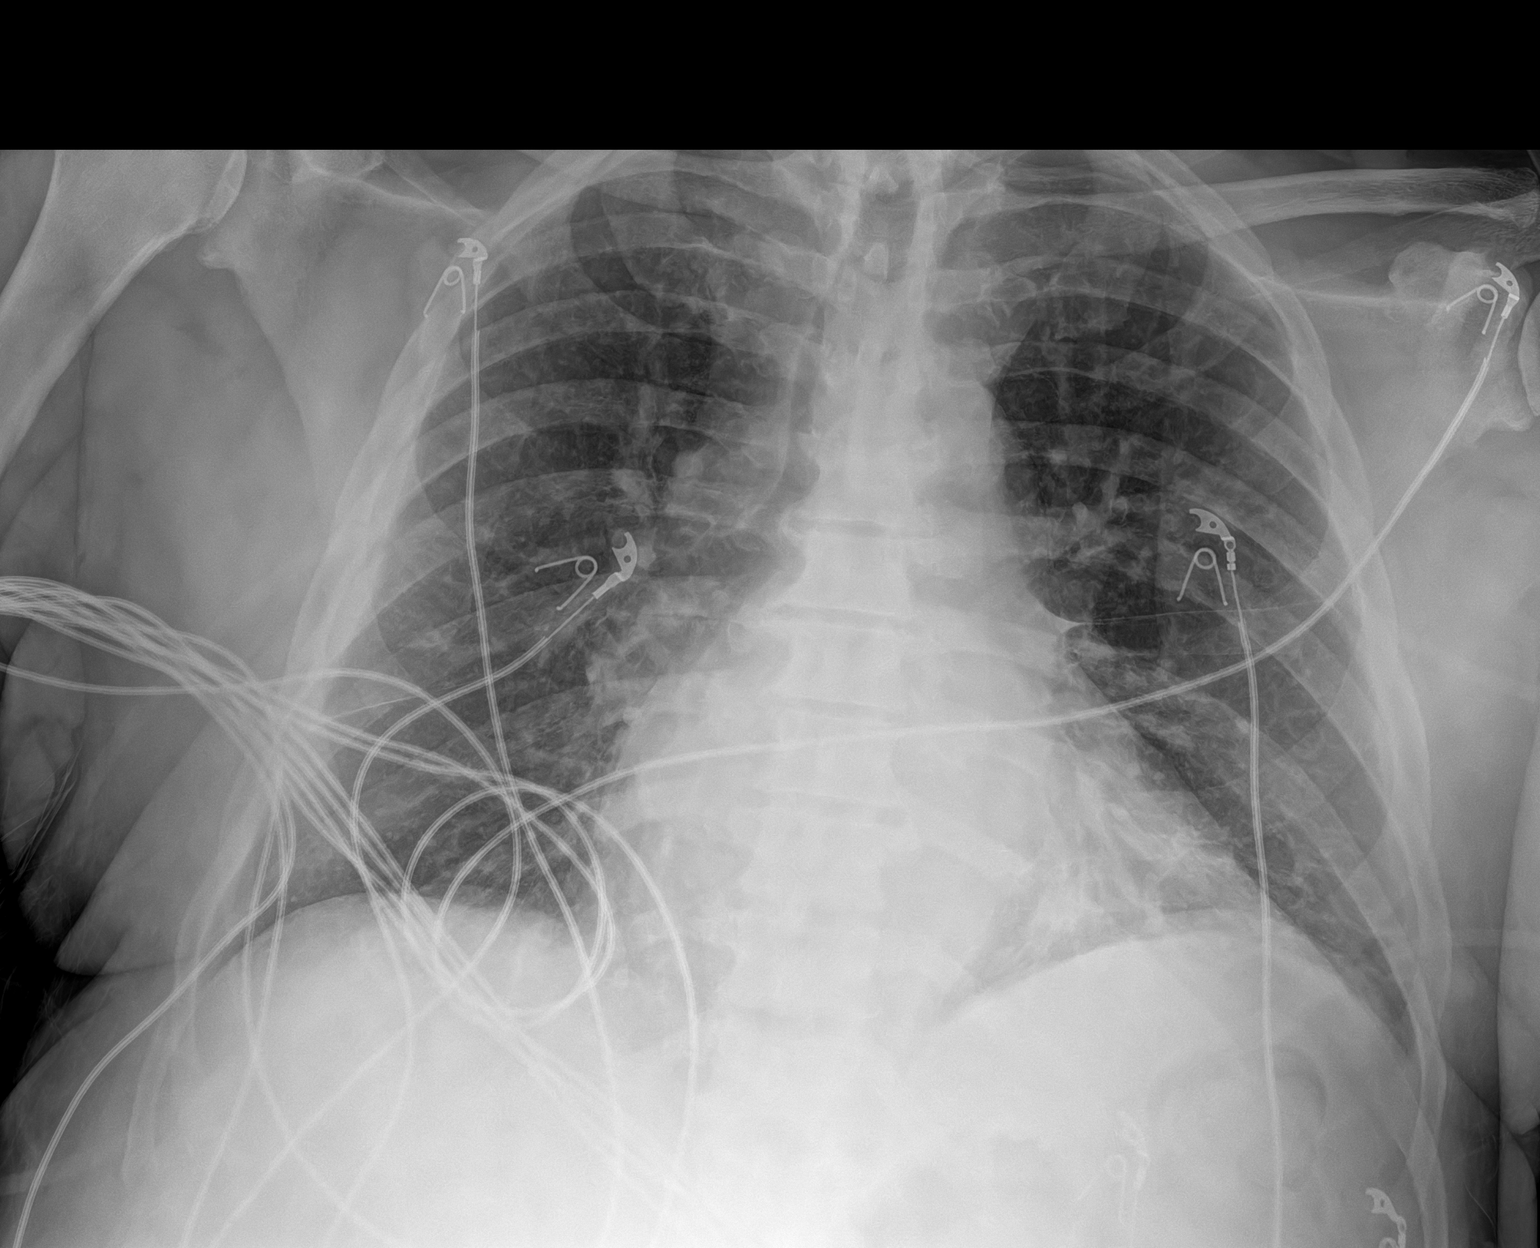

[1 of 1 positions shown; findings below may reference images not displayed]

FINDINGS: Stable enlarged cardiac silhouette, clear lungs and mildly prominent
pulmonary vasculature. Thoracic spine degenerative changes.
IMPRESSION: Stable cardiomegaly and mild pulmonary vascular congestion.

## 2021-08-11 IMAGING — DX DG CHEST 1V PORT
1 series · 1 of 1 positions shown · non-contrast
Comparison: Two days ago

CLINICAL DATA: Septic shock

EXAM:
PORTABLE CHEST 1 VIEW

[chest ap]
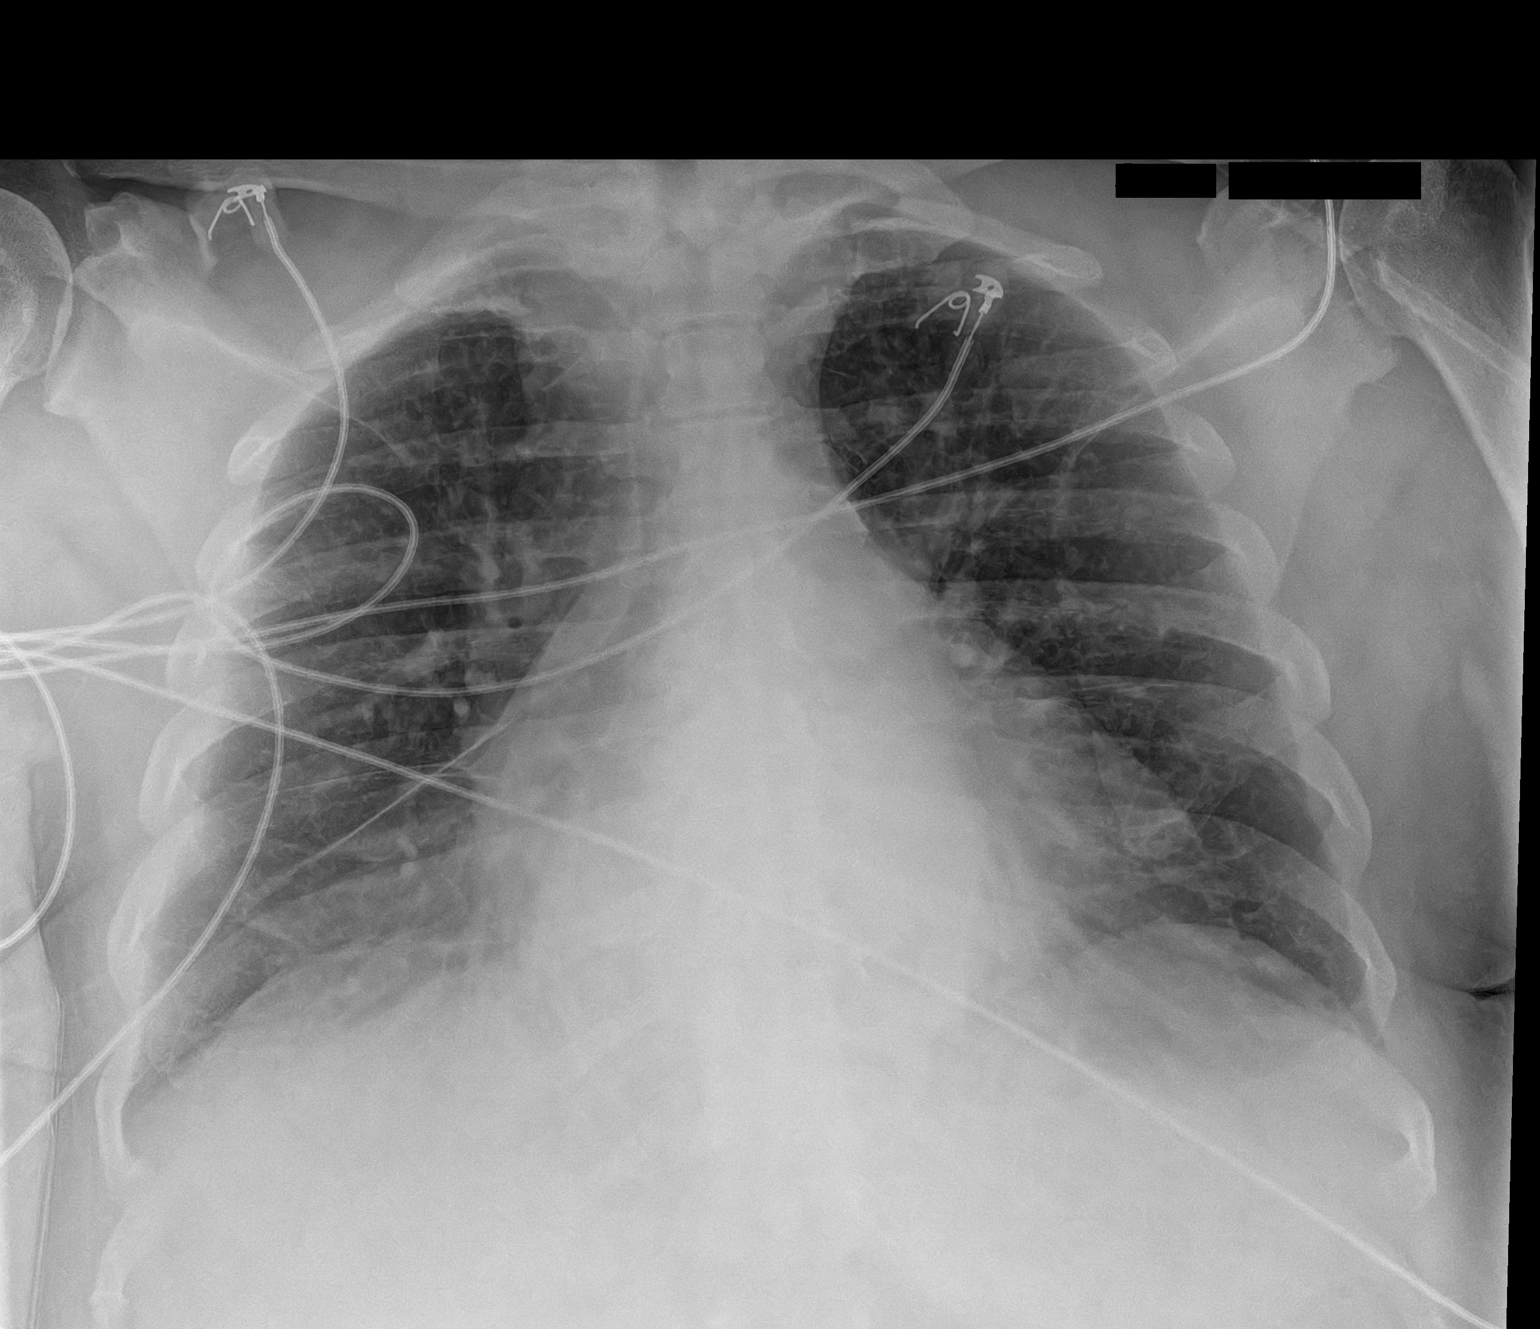

[1 of 1 positions shown; findings below may reference images not displayed]

FINDINGS: Low volume chest with mild vascular prominence. There is no edema,
consolidation, effusion, or pneumothorax. Mild cardiomegaly
accentuated by low volumes.
IMPRESSION: Stable low volume chest.
